# Patient Record
Sex: Male | Born: 1960 | Race: White | Hispanic: No | Marital: Married | Smoking: Never smoker
Health system: Southern US, Community
[De-identification: ages and names within clinical notes are randomized; demographics above are authoritative.]

## PROBLEM LIST (undated history)

## (undated) DIAGNOSIS — Z8489 Family history of other specified conditions: Secondary | ICD-10-CM

## (undated) DIAGNOSIS — I4891 Unspecified atrial fibrillation: Secondary | ICD-10-CM

## (undated) DIAGNOSIS — I509 Heart failure, unspecified: Secondary | ICD-10-CM

## (undated) DIAGNOSIS — Z992 Dependence on renal dialysis: Secondary | ICD-10-CM

## (undated) DIAGNOSIS — R5381 Other malaise: Secondary | ICD-10-CM

## (undated) DIAGNOSIS — I428 Other cardiomyopathies: Secondary | ICD-10-CM

## (undated) DIAGNOSIS — G40909 Epilepsy, unspecified, not intractable, without status epilepticus: Secondary | ICD-10-CM

## (undated) DIAGNOSIS — E43 Unspecified severe protein-calorie malnutrition: Secondary | ICD-10-CM

## (undated) DIAGNOSIS — Z940 Kidney transplant status: Secondary | ICD-10-CM

## (undated) DIAGNOSIS — Q600 Renal agenesis, unilateral: Secondary | ICD-10-CM

## (undated) DIAGNOSIS — E213 Hyperparathyroidism, unspecified: Secondary | ICD-10-CM

## (undated) DIAGNOSIS — I151 Hypertension secondary to other renal disorders: Secondary | ICD-10-CM

## (undated) DIAGNOSIS — E079 Disorder of thyroid, unspecified: Secondary | ICD-10-CM

## (undated) DIAGNOSIS — R569 Unspecified convulsions: Secondary | ICD-10-CM

## (undated) DIAGNOSIS — I823 Embolism and thrombosis of renal vein: Secondary | ICD-10-CM

## (undated) DIAGNOSIS — E7800 Pure hypercholesterolemia, unspecified: Secondary | ICD-10-CM

## (undated) DIAGNOSIS — N189 Chronic kidney disease, unspecified: Secondary | ICD-10-CM

## (undated) DIAGNOSIS — N186 End stage renal disease: Secondary | ICD-10-CM

## (undated) HISTORY — DX: Unspecified convulsions: R56.9

## (undated) HISTORY — DX: Other cardiomyopathies: I42.8

## (undated) HISTORY — DX: Heart failure, unspecified: I50.9

## (undated) HISTORY — DX: Renal agenesis, unilateral: Q60.0

## (undated) HISTORY — DX: Disorder of thyroid, unspecified: E07.9

## (undated) HISTORY — DX: Embolism and thrombosis of renal vein: I82.3

## (undated) HISTORY — PX: HERNIA REPAIR: SHX51

## (undated) HISTORY — DX: Hypertension secondary to other renal disorders: I15.1

## (undated) HISTORY — DX: Epilepsy, unspecified, not intractable, without status epilepticus: G40.909

## (undated) HISTORY — DX: Pure hypercholesterolemia, unspecified: E78.00

## (undated) HISTORY — DX: End stage renal disease: N18.6

## (undated) HISTORY — DX: Hyperparathyroidism, unspecified: E21.3

## (undated) HISTORY — DX: Unspecified severe protein-calorie malnutrition: E43

## (undated) HISTORY — DX: Chronic kidney disease, unspecified: N18.9

## (undated) HISTORY — DX: End stage renal disease: Z99.2

## (undated) HISTORY — PX: TONSILLECTOMY: SUR1361

## (undated) HISTORY — DX: Other malaise: R53.81

## (undated) HISTORY — PX: KIDNEY SURGERY: SHX687

## (undated) HISTORY — DX: Hypertension secondary to other renal disorders: Z94.0

---

## 2006-09-18 ENCOUNTER — Ambulatory Visit: Payer: Self-pay | Admitting: Internal Medicine

## 2006-09-18 ENCOUNTER — Encounter (INDEPENDENT_AMBULATORY_CARE_PROVIDER_SITE_OTHER): Payer: Self-pay | Admitting: *Deleted

## 2006-09-18 DIAGNOSIS — M109 Gout, unspecified: Secondary | ICD-10-CM

## 2006-09-18 DIAGNOSIS — I428 Other cardiomyopathies: Secondary | ICD-10-CM | POA: Insufficient documentation

## 2006-09-18 DIAGNOSIS — R569 Unspecified convulsions: Secondary | ICD-10-CM

## 2006-09-18 DIAGNOSIS — N189 Chronic kidney disease, unspecified: Secondary | ICD-10-CM

## 2006-09-18 LAB — CONVERTED CEMR LAB: INR: 1

## 2006-09-19 LAB — CONVERTED CEMR LAB
Albumin: 4 g/dL (ref 3.5–5.2)
Basophils Absolute: 0 10*3/uL (ref 0.0–0.1)
Basophils Relative: 1 % (ref 0–1)
Calcium: 9.5 mg/dL (ref 8.4–10.5)
Eosinophils Absolute: 0.1 10*3/uL (ref 0.0–0.7)
Eosinophils Relative: 2 % (ref 0–5)
HCT: 42.1 % (ref 39.0–52.0)
MCHC: 33.3 g/dL (ref 30.0–36.0)
MCV: 96.8 fL (ref 78.0–100.0)
Platelets: 99 10*3/uL — ABNORMAL LOW (ref 150–400)
RDW: 14.6 % — ABNORMAL HIGH (ref 11.5–14.0)
Sodium: 142 meq/L (ref 135–145)
Valproic Acid Lvl: 99.2 ug/mL (ref 50.0–100.0)

## 2006-10-02 ENCOUNTER — Emergency Department (HOSPITAL_COMMUNITY): Admission: EM | Admit: 2006-10-02 | Discharge: 2006-10-02 | Payer: Self-pay | Admitting: Emergency Medicine

## 2006-10-03 ENCOUNTER — Encounter (INDEPENDENT_AMBULATORY_CARE_PROVIDER_SITE_OTHER): Payer: Self-pay | Admitting: *Deleted

## 2006-10-18 ENCOUNTER — Encounter (INDEPENDENT_AMBULATORY_CARE_PROVIDER_SITE_OTHER): Payer: Self-pay | Admitting: Internal Medicine

## 2006-10-18 ENCOUNTER — Ambulatory Visit: Payer: Self-pay | Admitting: Internal Medicine

## 2006-10-21 LAB — CONVERTED CEMR LAB: Valproic Acid Lvl: 102.5 ug/mL — ABNORMAL HIGH (ref 50.0–100.0)

## 2007-03-01 ENCOUNTER — Emergency Department (HOSPITAL_COMMUNITY): Admission: EM | Admit: 2007-03-01 | Discharge: 2007-03-01 | Payer: Self-pay | Admitting: Emergency Medicine

## 2007-05-26 ENCOUNTER — Encounter (INDEPENDENT_AMBULATORY_CARE_PROVIDER_SITE_OTHER): Payer: Self-pay | Admitting: Internal Medicine

## 2007-08-14 ENCOUNTER — Encounter (INDEPENDENT_AMBULATORY_CARE_PROVIDER_SITE_OTHER): Payer: Self-pay | Admitting: Internal Medicine

## 2007-10-10 ENCOUNTER — Encounter (INDEPENDENT_AMBULATORY_CARE_PROVIDER_SITE_OTHER): Payer: Self-pay | Admitting: Internal Medicine

## 2007-12-10 ENCOUNTER — Encounter (INDEPENDENT_AMBULATORY_CARE_PROVIDER_SITE_OTHER): Payer: Self-pay | Admitting: Internal Medicine

## 2008-03-11 ENCOUNTER — Encounter (INDEPENDENT_AMBULATORY_CARE_PROVIDER_SITE_OTHER): Payer: Self-pay | Admitting: Internal Medicine

## 2008-04-21 ENCOUNTER — Telehealth (INDEPENDENT_AMBULATORY_CARE_PROVIDER_SITE_OTHER): Payer: Self-pay | Admitting: Internal Medicine

## 2008-04-29 ENCOUNTER — Encounter: Payer: Self-pay | Admitting: Internal Medicine

## 2008-04-29 ENCOUNTER — Ambulatory Visit: Payer: Self-pay | Admitting: *Deleted

## 2008-04-30 LAB — CONVERTED CEMR LAB
ALT: 12 units/L (ref 0–53)
AST: 19 units/L (ref 0–37)
Basophils Absolute: 0 10*3/uL (ref 0.0–0.1)
Basophils Relative: 0 % (ref 0–1)
CO2: 23 meq/L (ref 19–32)
Chloride: 105 meq/L (ref 96–112)
Eosinophils Relative: 2 % (ref 0–5)
Lymphocytes Relative: 39 % (ref 12–46)
MCHC: 32.1 g/dL (ref 30.0–36.0)
Neutro Abs: 3 10*3/uL (ref 1.7–7.7)
Platelets: 96 10*3/uL — ABNORMAL LOW (ref 150–400)
RDW: 14.2 % (ref 11.5–15.5)
Sodium: 140 meq/L (ref 135–145)
Total Bilirubin: 0.7 mg/dL (ref 0.3–1.2)
Total Protein: 6 g/dL (ref 6.0–8.3)

## 2008-06-23 ENCOUNTER — Encounter: Payer: Self-pay | Admitting: Internal Medicine

## 2008-08-17 ENCOUNTER — Ambulatory Visit: Payer: Self-pay | Admitting: Infectious Disease

## 2008-08-17 ENCOUNTER — Encounter: Payer: Self-pay | Admitting: Internal Medicine

## 2008-08-17 DIAGNOSIS — D696 Thrombocytopenia, unspecified: Secondary | ICD-10-CM

## 2008-08-17 LAB — CONVERTED CEMR LAB
Cholesterol: 171 mg/dL (ref 0–200)
HCT: 47.1 % (ref 39.0–52.0)
MCHC: 34 g/dL (ref 30.0–36.0)
MCV: 101.7 fL — ABNORMAL HIGH (ref 78.0–100.0)
Platelets: 65 10*3/uL — ABNORMAL LOW (ref 150–400)
RDW: 15.5 % (ref 11.5–15.5)
Total CHOL/HDL Ratio: 4.2
Valproic Acid Lvl: 121.4 ug/mL — ABNORMAL HIGH (ref 50.0–100.0)
WBC: 6.5 10*3/uL (ref 4.0–10.5)

## 2008-08-25 ENCOUNTER — Encounter: Payer: Self-pay | Admitting: Internal Medicine

## 2008-08-25 ENCOUNTER — Ambulatory Visit (HOSPITAL_COMMUNITY): Admission: RE | Admit: 2008-08-25 | Discharge: 2008-08-25 | Payer: Self-pay | Admitting: Internal Medicine

## 2008-08-31 ENCOUNTER — Ambulatory Visit: Payer: Self-pay | Admitting: Oncology

## 2008-09-09 LAB — CBC WITH DIFFERENTIAL (CANCER CENTER ONLY)
Eosinophils Absolute: 0.2 10*3/uL (ref 0.0–0.5)
LYMPH%: 38.6 % (ref 14.0–48.0)
MCV: 100 fL — ABNORMAL HIGH (ref 82–98)
MONO#: 0.5 10*3/uL (ref 0.1–0.9)
NEUT#: 2.5 10*3/uL (ref 1.5–6.5)
Platelets: 103 10*3/uL — ABNORMAL LOW (ref 145–400)
RBC: 4.42 10*6/uL (ref 4.20–5.70)
WBC: 5.3 10*3/uL (ref 4.0–10.0)

## 2008-09-09 LAB — MORPHOLOGY - CHCC SATELLITE

## 2008-09-09 LAB — CMP (CANCER CENTER ONLY)
Albumin: 3.1 g/dL — ABNORMAL LOW (ref 3.3–5.5)
BUN, Bld: 51 mg/dL — ABNORMAL HIGH (ref 7–22)
CO2: 26 mEq/L (ref 18–33)
Calcium: 10.8 mg/dL — ABNORMAL HIGH (ref 8.0–10.3)
Chloride: 111 mEq/L — ABNORMAL HIGH (ref 98–108)
Glucose, Bld: 106 mg/dL (ref 73–118)
Potassium: 5 mEq/L — ABNORMAL HIGH (ref 3.3–4.7)

## 2008-09-09 LAB — LACTATE DEHYDROGENASE: LDH: 183 U/L (ref 94–250)

## 2008-09-23 ENCOUNTER — Encounter: Payer: Self-pay | Admitting: Internal Medicine

## 2008-12-01 ENCOUNTER — Ambulatory Visit: Payer: Self-pay | Admitting: Oncology

## 2008-12-07 LAB — CBC WITH DIFFERENTIAL (CANCER CENTER ONLY)
Eosinophils Absolute: 0.2 10*3/uL (ref 0.0–0.5)
LYMPH#: 2.2 10*3/uL (ref 0.9–3.3)
MCH: 34.7 pg — ABNORMAL HIGH (ref 28.0–33.4)
MONO#: 0.4 10*3/uL (ref 0.1–0.9)
MONO%: 5.7 % (ref 0.0–13.0)
NEUT#: 3.9 10*3/uL (ref 1.5–6.5)
Platelets: 131 10*3/uL — ABNORMAL LOW (ref 145–400)
RBC: 4.52 10*6/uL (ref 4.20–5.70)
WBC: 6.7 10*3/uL (ref 4.0–10.0)

## 2009-01-07 ENCOUNTER — Telehealth: Payer: Self-pay | Admitting: Internal Medicine

## 2009-01-27 ENCOUNTER — Encounter: Payer: Self-pay | Admitting: Internal Medicine

## 2009-02-21 ENCOUNTER — Telehealth: Payer: Self-pay | Admitting: Internal Medicine

## 2009-03-28 ENCOUNTER — Encounter: Payer: Self-pay | Admitting: Internal Medicine

## 2009-05-26 ENCOUNTER — Encounter: Payer: Self-pay | Admitting: Internal Medicine

## 2009-07-27 ENCOUNTER — Telehealth: Payer: Self-pay | Admitting: Internal Medicine

## 2009-08-08 ENCOUNTER — Encounter: Payer: Self-pay | Admitting: Internal Medicine

## 2009-09-29 ENCOUNTER — Encounter: Payer: Self-pay | Admitting: Internal Medicine

## 2009-10-27 ENCOUNTER — Telehealth: Payer: Self-pay | Admitting: Internal Medicine

## 2009-10-31 ENCOUNTER — Encounter: Payer: Self-pay | Admitting: Internal Medicine

## 2009-11-10 ENCOUNTER — Telehealth: Payer: Self-pay | Admitting: Internal Medicine

## 2009-12-12 ENCOUNTER — Encounter: Payer: Self-pay | Admitting: Internal Medicine

## 2010-02-08 ENCOUNTER — Emergency Department (HOSPITAL_COMMUNITY)
Admission: EM | Admit: 2010-02-08 | Discharge: 2010-02-08 | Payer: Self-pay | Source: Home / Self Care | Admitting: Emergency Medicine

## 2010-02-15 ENCOUNTER — Encounter: Payer: Self-pay | Admitting: Internal Medicine

## 2010-04-25 LAB — CBC AND DIFFERENTIAL

## 2010-04-26 NOTE — Letter (Signed)
Summary: [facility]  [facility]   Imported By: Garlan Fillers 08/24/2009 14:33:22  _____________________________________________________________________  External Attachment:    Type:   Image     Comment:   External Document

## 2010-04-26 NOTE — Consult Note (Signed)
Summary: [facility]  [facility]   Imported By: Lacy Duverney 12/28/2009 15:46:51  _____________________________________________________________________  External Attachment:    Type:   Image     Comment:   External Document

## 2010-04-26 NOTE — Consult Note (Signed)
Summary: [facility]  [facility]   Imported By: Lacy Duverney 10/26/2009 16:25:18  _____________________________________________________________________  External Attachment:    Type:   Image     Comment:   External Document

## 2010-04-26 NOTE — Consult Note (Signed)
Summary: Peter Sloan   Imported By: Lacy Duverney 11/07/2009 12:25:52  _____________________________________________________________________  External Attachment:    Type:   Image     Comment:   External Document

## 2010-04-26 NOTE — Progress Notes (Signed)
Summary: med refill/gp  Phone Note Refill Request Message from:  Fax from Pharmacy on Jul 27, 2009 1:59 PM  Refills Requested: Medication #1:  PRAVACHOL 40 MG TABS one table by mouth daily.   Last Refilled: 07/06/2009  Method Requested: Electronic Initial call taken by: Morrison Old RN,  Jul 27, 2009 1:59 PM    Prescriptions: PRAVACHOL 40 MG TABS (PRAVASTATIN SODIUM) one table by mouth daily  #45 x 4   Entered and Authorized by:   Rudie Meyer MD   Signed by:   Rudie Meyer MD on 07/27/2009   Method used:   [facility] [address] [phone]* (retail)       [address]       [place], [place]  [address]       Ph: [phone] or [phone]       Fax: [phone]   RxID:   [id number]

## 2010-04-26 NOTE — Consult Note (Signed)
Summary: [facility]   Imported By: Bonner Puna 06/07/2009 10:20:26  _____________________________________________________________________  External Attachment:    Type:   Image     Comment:   External Document

## 2010-04-26 NOTE — Progress Notes (Signed)
Summary: med refill/gp  Phone Note Refill Request Message from:  Fax from Pharmacy on November 10, 2009 10:51 AM  Refills Requested: Medication #1:  CALCITRIOL 0.25 MCG  CAPS Take 1 capsule by mouth once a day Next appt. Aug. 25.   Method Requested: Electronic Initial call taken by: Morrison Old RN,  November 10, 2009 10:51 AM    Prescriptions: CALCITRIOL 0.25 MCG  CAPS (CALCITRIOL) Take 1 capsule by mouth once a day  #45 x 3   Entered and Authorized by:   Rudie Meyer MD   Signed by:   Rudie Meyer MD on 11/10/2009   Method used:   Electronically to        [facility]  [phone]* (retail)       [address]       [place], [place]  [address]       Ph: [phone] or [phone]       Fax: [phone]   RxID:   [id number]

## 2010-04-26 NOTE — Consult Note (Signed)
Summary: [facility]   Imported By: Bonner Puna 04/27/2009 16:41:11  _____________________________________________________________________  External Attachment:    Type:   Image     Comment:   External Document

## 2010-04-26 NOTE — Progress Notes (Signed)
Summary: med rfill/gp  Phone Note Refill Request Message from:  Fax from Pharmacy on October 27, 2009 10:29 AM  Refills Requested: Medication #1:  COREG 25 MG  TABS Take 1 tablet by mouth two times a day   Last Refilled: 08/25/2009 Last appt. May 25,2010.  I talked to pt.'s wife; appt. scheduled for Aug. 25 with you.   Method Requested: Electronic Initial call taken by: Morrison Old RN,  October 27, 2009 10:29 AM    Prescriptions: COREG 25 MG  TABS (CARVEDILOL) Take 1 tablet by mouth two times a day  #90 x 3   Entered and Authorized by:   Rudie Meyer MD   Signed by:   Rudie Meyer MD on 10/27/2009   Method used:   [facility]        [facility] [address] [phone]* (retail)       [address]       [place], [place]  [address]       Ph: [phone] or [phone]       Fax: [phone]   RxID:   [id number]

## 2010-05-11 ENCOUNTER — Encounter: Payer: Self-pay | Admitting: *Deleted

## 2010-05-15 ENCOUNTER — Emergency Department (HOSPITAL_COMMUNITY)
Admission: EM | Admit: 2010-05-15 | Discharge: 2010-05-15 | Disposition: A | Discharge status: Home / Self Care | Payer: Medicare Other | Attending: Emergency Medicine | Admitting: Emergency Medicine

## 2010-05-15 DIAGNOSIS — Z79899 Other long term (current) drug therapy: Secondary | ICD-10-CM | POA: Insufficient documentation

## 2010-05-15 DIAGNOSIS — G40909 Epilepsy, unspecified, not intractable, without status epilepticus: Secondary | ICD-10-CM | POA: Insufficient documentation

## 2010-05-15 DIAGNOSIS — I10 Essential (primary) hypertension: Secondary | ICD-10-CM | POA: Insufficient documentation

## 2010-05-15 DIAGNOSIS — I4891 Unspecified atrial fibrillation: Secondary | ICD-10-CM | POA: Insufficient documentation

## 2010-05-15 LAB — VALPROIC ACID LEVEL: Valproic Acid Lvl: 90.3 ug/mL (ref 50.0–100.0)

## 2010-05-15 LAB — DIFFERENTIAL
Lymphocytes Relative: 24 % (ref 12–46)
Lymphs Abs: 1.7 10*3/uL (ref 0.7–4.0)
Monocytes Absolute: 0.8 10*3/uL (ref 0.1–1.0)
Monocytes Relative: 11 % (ref 3–12)
Neutro Abs: 4.5 10*3/uL (ref 1.7–7.7)

## 2010-05-15 LAB — BASIC METABOLIC PANEL
CO2: 27 mEq/L (ref 19–32)
Calcium: 9.5 mg/dL (ref 8.4–10.5)
Creatinine, Ser: 5.92 mg/dL — ABNORMAL HIGH (ref 0.4–1.5)
Glucose, Bld: 124 mg/dL — ABNORMAL HIGH (ref 70–99)

## 2010-05-15 LAB — CBC
HCT: 45.6 % (ref 39.0–52.0)
Hemoglobin: 15.6 g/dL (ref 13.0–17.0)
MCH: 33.2 pg (ref 26.0–34.0)
MCHC: 34.2 g/dL (ref 30.0–36.0)

## 2010-06-06 LAB — POCT I-STAT, CHEM 8
BUN: 67 mg/dL — ABNORMAL HIGH (ref 6–23)
Chloride: 109 mEq/L (ref 96–112)
Glucose, Bld: 99 mg/dL (ref 70–99)
HCT: 52 % (ref 39.0–52.0)
Potassium: 3 mEq/L — ABNORMAL LOW (ref 3.5–5.1)

## 2010-06-06 LAB — VALPROIC ACID LEVEL: Valproic Acid Lvl: 83.1 ug/mL (ref 50.0–100.0)

## 2010-06-08 ENCOUNTER — Encounter (INDEPENDENT_AMBULATORY_CARE_PROVIDER_SITE_OTHER): Payer: Medicare Other

## 2010-06-08 DIAGNOSIS — M79609 Pain in unspecified limb: Secondary | ICD-10-CM

## 2010-07-14 ENCOUNTER — Encounter: Payer: Self-pay | Admitting: Internal Medicine

## 2010-07-16 ENCOUNTER — Emergency Department (HOSPITAL_COMMUNITY)
Admission: EM | Admit: 2010-07-16 | Discharge: 2010-07-16 | Disposition: A | Discharge status: Home / Self Care | Payer: Medicare Other | Attending: Emergency Medicine | Admitting: Emergency Medicine

## 2010-07-16 DIAGNOSIS — E7800 Pure hypercholesterolemia, unspecified: Secondary | ICD-10-CM | POA: Insufficient documentation

## 2010-07-16 DIAGNOSIS — I129 Hypertensive chronic kidney disease with stage 1 through stage 4 chronic kidney disease, or unspecified chronic kidney disease: Secondary | ICD-10-CM | POA: Insufficient documentation

## 2010-07-16 DIAGNOSIS — G40909 Epilepsy, unspecified, not intractable, without status epilepticus: Secondary | ICD-10-CM | POA: Insufficient documentation

## 2010-07-16 DIAGNOSIS — I4891 Unspecified atrial fibrillation: Secondary | ICD-10-CM | POA: Insufficient documentation

## 2010-07-16 DIAGNOSIS — E860 Dehydration: Secondary | ICD-10-CM | POA: Insufficient documentation

## 2010-07-16 DIAGNOSIS — N189 Chronic kidney disease, unspecified: Secondary | ICD-10-CM | POA: Insufficient documentation

## 2010-07-16 LAB — POCT I-STAT, CHEM 8
BUN: 66 mg/dL — ABNORMAL HIGH (ref 6–23)
Chloride: 106 mEq/L (ref 96–112)
HCT: 55 % — ABNORMAL HIGH (ref 39.0–52.0)
Potassium: 3.4 mEq/L — ABNORMAL LOW (ref 3.5–5.1)

## 2010-07-16 LAB — GLUCOSE, CAPILLARY: Glucose-Capillary: 104 mg/dL — ABNORMAL HIGH (ref 70–99)

## 2010-07-16 LAB — VALPROIC ACID LEVEL: Valproic Acid Lvl: 96.9 ug/mL (ref 50.0–100.0)

## 2010-07-31 ENCOUNTER — Encounter: Payer: Self-pay | Admitting: Internal Medicine

## 2011-01-01 LAB — I-STAT 8, (EC8 V) (CONVERTED LAB)
Acid-base deficit: 7 — ABNORMAL HIGH
Bicarbonate: 19.5 — ABNORMAL LOW
HCT: 43
Hemoglobin: 14.6
Operator id: 284251
Sodium: 139
TCO2: 21

## 2011-01-01 LAB — POCT I-STAT CREATININE: Operator id: 284251

## 2011-01-01 LAB — VALPROIC ACID LEVEL: Valproic Acid Lvl: 93.9

## 2011-01-09 LAB — CBC
MCHC: 34.1
RDW: 15.2 — ABNORMAL HIGH

## 2011-01-09 LAB — I-STAT 8, (EC8 V) (CONVERTED LAB)
Acid-base deficit: 5 — ABNORMAL HIGH
Chloride: 107
Glucose, Bld: 107 — ABNORMAL HIGH
TCO2: 20
pCO2, Ven: 30.8 — ABNORMAL LOW
pH, Ven: 7.392 — ABNORMAL HIGH

## 2011-01-09 LAB — DIFFERENTIAL
Basophils Absolute: 0
Basophils Relative: 1
Neutro Abs: 4.5
Neutrophils Relative %: 59

## 2011-01-09 LAB — POCT I-STAT CREATININE
Creatinine, Ser: 5.1 — ABNORMAL HIGH
Operator id: 270651

## 2012-01-12 ENCOUNTER — Encounter (HOSPITAL_COMMUNITY): Payer: Self-pay | Admitting: Physical Medicine and Rehabilitation

## 2012-01-12 ENCOUNTER — Observation Stay (HOSPITAL_COMMUNITY)
Admission: EM | Admit: 2012-01-12 | Discharge: 2012-01-13 | Disposition: A | Discharge status: Home / Self Care | Payer: Medicare Other | Attending: Internal Medicine | Admitting: Internal Medicine

## 2012-01-12 ENCOUNTER — Emergency Department (HOSPITAL_COMMUNITY): Payer: Medicare Other

## 2012-01-12 DIAGNOSIS — Q602 Renal agenesis, unspecified: Secondary | ICD-10-CM | POA: Insufficient documentation

## 2012-01-12 DIAGNOSIS — I4891 Unspecified atrial fibrillation: Secondary | ICD-10-CM | POA: Insufficient documentation

## 2012-01-12 DIAGNOSIS — Z7902 Long term (current) use of antithrombotics/antiplatelets: Secondary | ICD-10-CM | POA: Insufficient documentation

## 2012-01-12 DIAGNOSIS — G40901 Epilepsy, unspecified, not intractable, with status epilepticus: Secondary | ICD-10-CM

## 2012-01-12 DIAGNOSIS — Z79899 Other long term (current) drug therapy: Secondary | ICD-10-CM | POA: Insufficient documentation

## 2012-01-12 DIAGNOSIS — Z992 Dependence on renal dialysis: Secondary | ICD-10-CM | POA: Insufficient documentation

## 2012-01-12 DIAGNOSIS — M109 Gout, unspecified: Secondary | ICD-10-CM

## 2012-01-12 DIAGNOSIS — N189 Chronic kidney disease, unspecified: Secondary | ICD-10-CM

## 2012-01-12 DIAGNOSIS — E876 Hypokalemia: Secondary | ICD-10-CM

## 2012-01-12 DIAGNOSIS — I428 Other cardiomyopathies: Secondary | ICD-10-CM | POA: Insufficient documentation

## 2012-01-12 DIAGNOSIS — E785 Hyperlipidemia, unspecified: Secondary | ICD-10-CM | POA: Insufficient documentation

## 2012-01-12 DIAGNOSIS — D696 Thrombocytopenia, unspecified: Secondary | ICD-10-CM

## 2012-01-12 DIAGNOSIS — N186 End stage renal disease: Secondary | ICD-10-CM | POA: Insufficient documentation

## 2012-01-12 DIAGNOSIS — R569 Unspecified convulsions: Secondary | ICD-10-CM

## 2012-01-12 DIAGNOSIS — Q605 Renal hypoplasia, unspecified: Secondary | ICD-10-CM | POA: Insufficient documentation

## 2012-01-12 DIAGNOSIS — G40401 Other generalized epilepsy and epileptic syndromes, not intractable, with status epilepticus: Principal | ICD-10-CM | POA: Insufficient documentation

## 2012-01-12 LAB — COMPREHENSIVE METABOLIC PANEL
ALT: 11 U/L (ref 0–53)
AST: 22 U/L (ref 0–37)
Albumin: 3.5 g/dL (ref 3.5–5.2)
Calcium: 9.3 mg/dL (ref 8.4–10.5)
GFR calc Af Amer: 25 mL/min — ABNORMAL LOW (ref >90)
Glucose, Bld: 111 mg/dL — ABNORMAL HIGH (ref 70–99)
Sodium: 139 mEq/L (ref 135–145)
Total Protein: 6.6 g/dL (ref 6.0–8.3)

## 2012-01-12 LAB — CBC WITH DIFFERENTIAL/PLATELET
Basophils Absolute: 0 10*3/uL (ref 0.0–0.1)
Basophils Relative: 0 % (ref 0–1)
Eosinophils Absolute: 0.1 10*3/uL (ref 0.0–0.7)
Eosinophils Relative: 1 % (ref 0–5)
MCH: 33.2 pg (ref 26.0–34.0)
MCHC: 33.6 g/dL (ref 30.0–36.0)
MCV: 99 fL (ref 78.0–100.0)
Platelets: 217 10*3/uL (ref 150–400)
RDW: 13.5 % (ref 11.5–15.5)

## 2012-01-12 LAB — MRSA PCR SCREENING: MRSA by PCR: NEGATIVE

## 2012-01-12 MED ORDER — SIMVASTATIN 20 MG PO TABS
20.0000 mg | ORAL_TABLET | Freq: Every day | ORAL | Status: DC
  Filled 2012-01-12: qty 1

## 2012-01-12 MED ORDER — LORAZEPAM 2 MG/ML IJ SOLN: 2.0000 mg | Freq: Once | INTRAMUSCULAR | Status: DC

## 2012-01-12 MED ORDER — DILTIAZEM HCL 90 MG PO TABS
90.0000 mg | ORAL_TABLET | Freq: Two times a day (BID) | ORAL | Status: DC
  Administered 2012-01-13 (×2): 90 mg via ORAL
  Filled 2012-01-12 (×3): qty 1

## 2012-01-12 MED ORDER — VALPROATE SODIUM 500 MG/5ML IV SOLN
500.0000 mg | Freq: Once | INTRAVENOUS | Status: DC
  Filled 2012-01-12: qty 5

## 2012-01-12 MED ORDER — LAMOTRIGINE 25 MG PO TABS
50.0000 mg | ORAL_TABLET | Freq: Two times a day (BID) | ORAL | Status: DC
  Administered 2012-01-13 (×2): 50 mg via ORAL
  Filled 2012-01-12 (×3): qty 2

## 2012-01-12 MED ORDER — SODIUM CHLORIDE 0.9 % IV BOLUS (SEPSIS)
1000.0000 mL | Freq: Once | INTRAVENOUS | Status: AC
  Administered 2012-01-12: 1000 mL via INTRAVENOUS

## 2012-01-12 MED ORDER — SODIUM CHLORIDE 0.9 % IJ SOLN
3.0000 mL | Freq: Two times a day (BID) | INTRAMUSCULAR | Status: DC
  Administered 2012-01-13: 3 mL via INTRAVENOUS

## 2012-01-12 MED ORDER — LAMOTRIGINE 50 MG PO TBDP: 1.0000 | ORAL_TABLET | Freq: Every day | ORAL | Status: DC

## 2012-01-12 MED ORDER — DIVALPROEX SODIUM ER 500 MG PO TB24
750.0000 mg | ORAL_TABLET | Freq: Once | ORAL | Status: AC
  Filled 2012-01-12: qty 1

## 2012-01-12 MED ORDER — SODIUM CHLORIDE 0.9 % IJ SOLN: 3.0000 mL | INTRAMUSCULAR | Status: DC | PRN

## 2012-01-12 MED ORDER — SODIUM CHLORIDE 0.9 % IV SOLN: INTRAVENOUS | Status: DC

## 2012-01-12 MED ORDER — RENA-VITE PO TABS
1.0000 | ORAL_TABLET | Freq: Every day | ORAL | Status: DC
  Administered 2012-01-13: 1 via ORAL
  Filled 2012-01-12 (×2): qty 1

## 2012-01-12 MED ORDER — ONDANSETRON HCL 4 MG/2ML IJ SOLN: 4.0000 mg | Freq: Four times a day (QID) | INTRAMUSCULAR | Status: DC | PRN

## 2012-01-12 MED ORDER — VALPROATE SODIUM 500 MG/5ML IV SOLN
500.0000 mg | Freq: Once | INTRAVENOUS | Status: AC
  Administered 2012-01-12: 500 mg via INTRAVENOUS
  Filled 2012-01-12 (×2): qty 5

## 2012-01-12 MED ORDER — AMIODARONE HCL 200 MG PO TABS
200.0000 mg | ORAL_TABLET | Freq: Every day | ORAL | Status: DC
  Administered 2012-01-13: 200 mg via ORAL
  Filled 2012-01-12: qty 1

## 2012-01-12 MED ORDER — ALLOPURINOL 300 MG PO TABS
300.0000 mg | ORAL_TABLET | Freq: Every evening | ORAL | Status: DC
  Administered 2012-01-13: 300 mg via ORAL
  Filled 2012-01-12: qty 1

## 2012-01-12 MED ORDER — ASPIRIN EC 81 MG PO TBEC
81.0000 mg | DELAYED_RELEASE_TABLET | ORAL | Status: DC
  Administered 2012-01-13: 81 mg via ORAL
  Filled 2012-01-12 (×2): qty 1

## 2012-01-12 MED ORDER — LORAZEPAM 2 MG/ML IJ SOLN
2.0000 mg | Freq: Once | INTRAMUSCULAR | Status: AC
  Administered 2012-01-12: 2 mg via INTRAVENOUS

## 2012-01-12 MED ORDER — ONDANSETRON HCL 4 MG PO TABS: 4.0000 mg | ORAL_TABLET | Freq: Four times a day (QID) | ORAL | Status: DC | PRN

## 2012-01-12 MED ORDER — DIVALPROEX SODIUM ER 500 MG PO TB24
750.0000 mg | ORAL_TABLET | Freq: Two times a day (BID) | ORAL | Status: DC
  Administered 2012-01-13 (×3): 750 mg via ORAL
  Filled 2012-01-12 (×3): qty 1

## 2012-01-12 MED ORDER — DIVALPROEX SODIUM ER 500 MG PO TB24: 500.0000 mg | ORAL_TABLET | Freq: Two times a day (BID) | ORAL | Status: DC

## 2012-01-12 MED ORDER — HEPARIN SODIUM (PORCINE) 5000 UNIT/ML IJ SOLN
5000.0000 [IU] | Freq: Three times a day (TID) | INTRAMUSCULAR | Status: DC
  Administered 2012-01-13 (×2): 5000 [IU] via SUBCUTANEOUS
  Filled 2012-01-12 (×4): qty 1

## 2012-01-12 MED ORDER — SODIUM CHLORIDE 0.9 % IV SOLN: 250.0000 mL | INTRAVENOUS | Status: DC | PRN

## 2012-01-12 MED ORDER — POTASSIUM CHLORIDE CRYS ER 20 MEQ PO TBCR
10.0000 meq | EXTENDED_RELEASE_TABLET | Freq: Once | ORAL | Status: AC
  Administered 2012-01-12: 10 meq via ORAL
  Filled 2012-01-12: qty 1

## 2012-01-12 NOTE — ED Notes (Signed)
Patient is resting comfortably. 

## 2012-01-12 NOTE — ED Notes (Signed)
Patient brought back from CT and X-ray.

## 2012-01-12 NOTE — ED Notes (Signed)
MD at bedside. 

## 2012-01-12 NOTE — ED Notes (Signed)
Patient transported to CT 

## 2012-01-12 NOTE — ED Notes (Signed)
MD at bedside. Dr. Tobias Alexander.

## 2012-01-12 NOTE — ED Provider Notes (Signed)
History     CSN: [id number]  Arrival date & time 01/12/12  1703   First MD Initiated Contact with Patient 01/12/12 1706      Chief Complaint  Patient presents with  . Seizures    (Consider location/radiation/quality/duration/timing/severity/associated sxs/prior treatment) HPI - Level 5 caveat due to unresponsive Pt with history of seizures has been well controlled for over a year on Depakote and Lamictal, no recent changes in dose. He had his normal dialysis this morning without complications. Wife reports this afternoon, she found him seizing, tonic-clonic and EMS was called. On their arrival, the reports several back to back seizures without return to baseline between. He was given Versed 7.5mg  total and no further seizures enroute. CBG was 150s.    Past Medical History  Diagnosis Date  . Congenital solitary kidney   . Chronic renal insufficiency     2/2 ischemic ATN, GFR 15-17 ml/min, baseline Cr: 4-4.5, never had a renal biopsy, s/p AV fistula placement in 2007, f/b nephrologist, Dr. Dorothe Pea, [place]  . Nonischemic cardiomyopathy     EF 20%(9/06) with most recent EF 50%(3/08), valves normal per 2D echo (3/08), normal doppler and color flow study(3/08) s/p endocmyocardial  biopsy: mild myocyte hypertrophy no myocyte damage or inflammation  . Seizure disorder     complex- partial, h/o sz disorder as a child  . Gout     h/o: many in Rt big toe and Rt Knee, never had aspirartion diagnosis    History reviewed. No pertinent past surgical history.  Family History  Problem Relation Age of Onset  . Osteoarthritis Mother   . Hypertension Father   . Muscular dystrophy Brother   . Heart disease Child     one 36 y/o son has MVP    History  Substance Use Topics  . Smoking status: Never Smoker   . Smokeless tobacco: Not on file  . Alcohol Use: No     social drinker      Review of Systems Unable to assess due to mental status.   Allergies  Diphenhydramine  hcl  Home Medications   Current Outpatient Rx  Name Route Sig Dispense Refill  . ASPIRIN 81 MG PO TABS Oral Take 81 mg by mouth daily.      Marland Kitchen CALCITRIOL 0.25 MCG PO CAPS Oral Take 0.25 mcg by mouth daily.      Marland Kitchen CARVEDILOL 25 MG PO TABS Oral Take 25 mg by mouth 2 (two) times daily with a meal.      . DIVALPROEX SODIUM ER 500 MG PO TB24 Oral Take 500 mg by mouth. Take 1 pill by mouth at night and one in the morning.     . FUROSEMIDE 20 MG PO TABS Oral Take 20 mg by mouth daily.      Marland Kitchen PRAVASTATIN SODIUM 40 MG PO TABS Oral Take 40 mg by mouth daily.        BP 84/45  Pulse 91  Temp 98.6 F (37 C) (Rectal)  SpO2 100%  Physical Exam  Nursing note and vitals reviewed. Constitutional: He appears well-developed and well-nourished.  HENT:  Head: Normocephalic and atraumatic.  Eyes: Pupils are equal, round, and reactive to light.  Neck: Neck supple.  Cardiovascular: Normal rate, normal heart sounds and intact distal pulses.   Pulmonary/Chest: Effort normal and breath sounds normal.  Abdominal: Soft. He exhibits no distension.  Musculoskeletal: Normal range of motion. He exhibits no edema and no tenderness.       Dialysis graft  in L arm with thrill  Skin: Skin is warm and dry. No rash noted.  Psychiatric: He has a normal mood and affect.    ED Course  Procedures (including critical care time)  Labs Reviewed  VALPROIC ACID LEVEL - Abnormal; Notable for the following:    Valproic Acid Lvl 36.8 (*)     All other components within normal limits  CBC WITH DIFFERENTIAL - Abnormal; Notable for the following:    RBC 3.94 (*)     Monocytes Relative 13 (*)     All other components within normal limits  COMPREHENSIVE METABOLIC PANEL - Abnormal; Notable for the following:    Potassium 3.1 (*)     Glucose, Bld 111 (*)     Creatinine, Ser 3.16 (*)     Total Bilirubin 0.2 (*)     GFR calc non Af Amer 21 (*)     GFR calc Af Amer 25 (*)     All other components within normal limits   MAGNESIUM  ETHANOL  MRSA PCR SCREENING  URINALYSIS, ROUTINE W REFLEX MICROSCOPIC  URINE RAPID DRUG SCREEN (HOSP PERFORMED)  LAMOTRIGINE LEVEL  CBC  CREATININE, SERUM  TSH   Dg Chest 1 View  01/12/2012  *RADIOLOGY REPORT*  Clinical Data: Status epilepticus  CHEST - 1 VIEW  Comparison: None  Findings: Elevated left hemidiaphragm.  Left lower lobe airspace disease may represent atelectasis.  No prior studies available. Right lung is clear.  Negative for heart failure.  IMPRESSION: Elevated left hemidiaphragm and left lower lobe atelectasis.   Original Report Authenticated By: Truett Perna, M.D.    Ct Head Wo Contrast  01/12/2012  *RADIOLOGY REPORT*  Clinical Data: Seizures  CT HEAD WITHOUT CONTRAST  Technique:  Contiguous axial images were obtained from the base of the skull through the vertex without contrast.  Comparison: None.  Findings: Study is suboptimal due to motion artifact.  Endotracheal tube is partially visualized.  There is a probable mucous retention cyst left maxillary sinus measures 1.3 cm.  No skull fracture is noted.  No intracranial hemorrhage, mass effect or midline shift.  No definite acute cortical infarction. No mass lesion is noted on this unenhanced scan.  IMPRESSION: No acute intracranial abnormality.  Suboptimal study due to motion. Endotracheal tube is partially visualized.   Original Report Authenticated By: Lahoma Crocker, M.D.      No diagnosis found.    MDM   Date: 01/12/2012  Rate: 105  Rhythm: sinus tachycardia  QRS Axis: normal  Intervals: normal  ST/T Wave abnormalities: nonspecific T wave changes  Conduction Disutrbances:none  Narrative Interpretation:   Old EKG Reviewed: fewer PVCs today   PT with subtherapeutic depakote. Waking up some and oriented to place and situation. Discussed with Neurology who recommended loading with depakote. Admit for further eval. Discussed with hospitalist.   CRITICAL CARE Performed by: Truddie Hidden.   Total critical care time: 35  Critical care time was exclusive of separately billable procedures and treating other patients.  Critical care was necessary to treat or prevent imminent or life-threatening deterioration.  Critical care was time spent personally by me on the following activities: development of treatment plan with patient and/or surrogate as well as nursing, discussions with consultants, evaluation of patient's response to treatment, examination of patient, obtaining history from patient or surrogate, ordering and performing treatments and interventions, ordering and review of laboratory studies, ordering and review of radiographic studies, pulse oximetry and re-evaluation of patient's condition.  Charles B. Karle Starch, MD 01/13/12 [id number]

## 2012-01-12 NOTE — Consult Note (Signed)
Reason for Consult: Status epilepticus  CC: Seizure  History is obtained from: Wife, mother  HPI: Peter Sloan is an 51 y.o. male with a history of seizure disorder since he was a small child who sees Dr. Gaynell Face as an outpatient. Today he had his first episode of status epilepticus. His wife reports that she heard a noise from the study and went to investigate and found him shaking. This lasted for approximately 15 minutes, and he paused briefly, then began disease again. This lasted until he was brought to the emergency room and given Ativan.   In the ER, he began to wake up in neurology was consulted for further medication recommendations.  He has been seizure-free for about a year and a half he takes Depakote 500 mg twice a day as well as lamotrigine 50 mg twice a day.   When I examined him, he was able to speak some but was still postictal period  ROS: Unable to obtain due to 2 postictal state, the wife states that he has not been sick  Past Medical History  Diagnosis Date  . Congenital solitary kidney   . Chronic renal insufficiency     2/2 ischemic ATN, GFR 15-17 ml/min, baseline Cr: 4-4.5, never had a renal biopsy, s/p AV fistula placement in 2007, f/b nephrologist, Dr. Dorothe Pea, [place]  . Nonischemic cardiomyopathy     EF 20%(9/06) with most recent EF 50%(3/08), valves normal per 2D echo (3/08), normal doppler and color flow study(3/08) s/p endocmyocardial  biopsy: mild myocyte hypertrophy no myocyte damage or inflammation  . Seizure disorder     complex- partial, h/o sz disorder as a child  . Gout     h/o: many in Rt big toe and Rt Knee, never had aspirartion diagnosis    Family History: Mother reports no history of seizure disorders in the family  Social History: Tob: Wife deny  Exam: Current vital signs: BP 104/62  Pulse 77  Temp 98.6 F (37 C) (Rectal)  Resp 22  SpO2 99% Vital signs in last 24 hours: Temp:  [98.6 F (37 C)] 98.6 F (37 C) (10/19  1715) Pulse Rate:  [73-92] 77  (10/19 2045) Resp:  [14-22] 22  (10/19 2045) BP: (79-128)/(45-79) 104/62 mmHg (10/19 2045) SpO2:  [99 %-100 %] 99 % (10/19 2045)  General: In bed with nonrebreather in place CV: Regular rate and rhythm Mental Status: Patient is awake, alert, oriented to person, place, states "either [place] or [place]", month, year, and situation. He does have a prolonged latency to respond to some questions, but complies with most commands Cranial Nerves: II: Visual Fields are full. Pupils are equal, round, and reactive to light.  Discs are difficult to visualize. III,IV, VI: EOMI without ptosis or diploplia.  V,VII: Facial sensation and movement are symmetric.  VIII: hearing is intact to voice X: Uvula elevates symmetrically XI: Shoulder shrug is symmetric. XII: tongue is midline without atrophy or fasciculations.  Motor: Tone is normal. Bulk is normal. 5/5 strength was present in all four extremities.  Sensory: Sensation is symmetric to light touch and temperature in the arms and legs. Deep Tendon Reflexes: 2+ and symmetric in the biceps and patellae. Cerebellar: FNF appears tremulous bilaterally  Gait: Did not assess due to patient safety concerns  I have reviewed labs in epic and the results pertinent to this consultation are: Depakote level-36  I have reviewed the images obtained: CT head-no acute hemorrhage but limited by motion  Impression: 51 year old male who  presented in status epilepticus. He is continuing to improve, and I am encouraged by his improvement in his mental status. His Depakote level is low, and he has been loaded with an extra 500 mg IV  Recommendations: 1) increase Depakote to 750 mg twice a day 2) continue lamotrigine 50 mg twice a day 3) lamotrigine level is currently pending  Roland Rack, MD [facility] [phone]  If 7pm- 7am, please page neurology on call at [phone].

## 2012-01-12 NOTE — ED Notes (Signed)
Family at bedside. 

## 2012-01-12 NOTE — ED Notes (Signed)
Pt presents to department via [facility] from home for evaluation of seizure. Witnessed by wife, history of seizures. 5-7 reported per EMS, length variable. Thick bloody secretions noted from mouth, tongue trauma noted. NRB mask upon arrival. HD patient, treatments Tues, Thur, Sat. Last treatment this morning without complications. Fistula L forearm. 18g RAC. CBG 138.

## 2012-01-13 ENCOUNTER — Encounter (HOSPITAL_COMMUNITY): Payer: Self-pay | Admitting: *Deleted

## 2012-01-13 DIAGNOSIS — I428 Other cardiomyopathies: Secondary | ICD-10-CM

## 2012-01-13 DIAGNOSIS — E785 Hyperlipidemia, unspecified: Secondary | ICD-10-CM

## 2012-01-13 LAB — BASIC METABOLIC PANEL
GFR calc Af Amer: 15 mL/min — ABNORMAL LOW (ref >90)
GFR calc non Af Amer: 13 mL/min — ABNORMAL LOW (ref >90)
Glucose, Bld: 87 mg/dL (ref 70–99)
Potassium: 4 mEq/L (ref 3.5–5.1)
Sodium: 141 mEq/L (ref 135–145)

## 2012-01-13 LAB — RAPID URINE DRUG SCREEN, HOSP PERFORMED
Cocaine: NOT DETECTED
Opiates: NOT DETECTED
Tetrahydrocannabinol: NOT DETECTED

## 2012-01-13 LAB — URINALYSIS, ROUTINE W REFLEX MICROSCOPIC
Hgb urine dipstick: NEGATIVE
Nitrite: NEGATIVE
Specific Gravity, Urine: 1.019 (ref 1.005–1.030)
Urobilinogen, UA: 0.2 mg/dL (ref 0.0–1.0)

## 2012-01-13 LAB — URINE MICROSCOPIC-ADD ON

## 2012-01-13 MED ORDER — DIVALPROEX SODIUM ER 250 MG PO TB24: 250.0000 mg | ORAL_TABLET | Freq: Two times a day (BID) | ORAL | Status: DC

## 2012-01-13 MED ORDER — ATORVASTATIN CALCIUM 10 MG PO TABS
10.0000 mg | ORAL_TABLET | Freq: Every day | ORAL | Status: DC
  Administered 2012-01-13: 10 mg via ORAL
  Filled 2012-01-13: qty 1

## 2012-01-13 MED ORDER — DIVALPROEX SODIUM ER 250 MG PO TB24: 250.0000 mg | ORAL_TABLET | Freq: Every day | ORAL | Status: DC

## 2012-01-13 MED ORDER — BIOTENE DRY MOUTH MT LIQD
15.0000 mL | Freq: Two times a day (BID) | OROMUCOSAL | Status: DC
  Administered 2012-01-13: 15 mL via OROMUCOSAL

## 2012-01-13 NOTE — H&P (Signed)
[facility] History and Physical  Andr… Brading [id number] DOB: 1960/09/25    PCP:  None Neurologist:  Dr Wyline Copas.  Chief Complaint: Seizure.  HPI: Peter Sloan is an 51 y.o. male with hx of Sz on Lamictal and Depakote, last Sz was one and a half years ago, brought into the ER with 2 episodes of seizures, witness by his mother.  He had seizure for about 15 minutes, ceased, then had another one.  In the ER, I was able to converse meaningfully with him.  He recognized everyones in the room with him.  Work up in the ER included a negative head CT, but a subthx Depakote level of 36.  He has a K of 3.1 and a Cr of 3.1. He has hx of ESRD and has been on HD.  He denied n/v, HA, fever, or chills.  He doesnot drink alcohol, and denied drug use. He has been compliant with his medications.  Neurologist has been consulted by EDP, and hospitalist was asked to admit him for further observation.  Rewiew of Systems:  Constitutional: Negative for malaise, fever and chills. No significant weight loss or weight gain Eyes: Negative for eye pain, redness and discharge, diplopia, visual changes, or flashes of light. ENMT: Negative for ear pain, hoarseness, nasal congestion, sinus pressure and sore throat. No headaches; tinnitus, drooling, or problem swallowing. Cardiovascular: Negative for chest pain, palpitations, diaphoresis, dyspnea and peripheral edema. ; No orthopnea, PND Respiratory: Negative for cough, hemoptysis, wheezing and stridor. No pleuritic chestpain. Gastrointestinal: Negative for nausea, vomiting, diarrhea, constipation, abdominal pain, melena, blood in stool, hematemesis, jaundice and rectal bleeding.    Genitourinary: Negative for frequency, dysuria, incontinence,flank pain and hematuria; Musculoskeletal: Negative for back pain and neck pain. Negative for swelling and trauma.;  Skin: . Negative for pruritus, rash, abrasions, bruising and skin lesion.; ulcerations Neuro: Negative  for headache, lightheadedness and neck stiffness. Negative for weakness, altered level of consciousness , altered mental status, extremity weakness, burning feet, involuntary movement, seizure and syncope.  Psych: negative for anxiety, depression, insomnia, tearfulness, panic attacks, hallucinations, paranoia, suicidal or homicidal ideation .   Past Medical History  Diagnosis Date  . Congenital solitary kidney   . Chronic renal insufficiency     2/2 ischemic ATN, GFR 15-17 ml/min, baseline Cr: 4-4.5, never had a renal biopsy, s/p AV fistula placement in 2007, f/b nephrologist, Dr. Dorothe Pea, [place]  . Nonischemic cardiomyopathy     EF 20%(9/06) with most recent EF 50%(3/08), valves normal per 2D echo (3/08), normal doppler and color flow study(3/08) s/p endocmyocardial  biopsy: mild myocyte hypertrophy no myocyte damage or inflammation  . Seizure disorder     complex- partial, h/o sz disorder as a child  . Gout     h/o: many in Rt big toe and Rt Knee, never had aspirartion diagnosis    History reviewed. No pertinent past surgical history.  Medications:  HOME MEDS: Prior to Admission medications   Medication Sig Start Date End Date Taking? Authorizing Provider  allopurinol (ZYLOPRIM) 300 MG tablet Take 300 mg by mouth every evening.   Yes Historical Provider, MD  amiodarone (PACERONE) 200 MG tablet Take 200 mg by mouth daily.   Yes Historical Provider, MD  aspirin 81 MG tablet Take 81 mg by mouth every morning.    Yes Historical Provider, MD  b complex-vitamin c-folic acid (NEPHRO-VITE) 0.8 MG TABS Take 0.8 mg by mouth daily.   Yes Historical Provider, MD  diltiazem (CARDIZEM) 90 MG tablet Take  90 mg by mouth 2 (two) times daily.   Yes Historical Provider, MD  divalproex (DEPAKOTE ER) 500 MG 24 hr tablet Take 500 mg by mouth 2 (two) times daily.    Yes Historical Provider, MD  LamoTRIgine 50 MG TBDP Take 1 tablet by mouth at bedtime.   Yes Historical Provider, MD  pravastatin  (PRAVACHOL) 40 MG tablet Take 40 mg by mouth at bedtime.    Yes Historical Provider, MD     Allergies:  Allergies  Allergen Reactions  . Diphenhydramine Hcl     Social History:   reports that he has never smoked. He does not have any smokeless tobacco history on file. He reports that he does not drink alcohol or use illicit drugs.  Family History: Family History  Problem Relation Age of Onset  . Osteoarthritis Mother   . Hypertension Father   . Muscular dystrophy Brother   . Heart disease Child     one 62 y/o son has MVP     Physical Exam: Filed Vitals:   01/12/12 2045 01/12/12 2200 01/13/12 0000 01/13/12 0002  BP: 104/62 118/69 92/61 100/60  Pulse: 77 81 79   Temp:  98.9 F (37.2 C) 97.7 F (36.5 C)   TempSrc:  Oral Oral   Resp: 22 20 18    SpO2: 99% 100% 100%    Blood pressure 100/60, pulse 79, temperature 97.7 F (36.5 C), temperature source Oral, resp. rate 18, SpO2 100.00%.  GEN:  Pleasant  patient lying in the stretcher in no acute distress; cooperative with exam. PSYCH:  alert and oriented x4; does not appear anxious or depressed; affect is appropriate. HEENT: Mucous membranes pink and anicteric; PERRLA; EOM intact; no cervical lymphadenopathy nor thyromegaly or carotid bruit; no JVD; There were no stridor. Neck is very supple. Breasts:: Not examined CHEST WALL: No tenderness CHEST: Normal respiration, clear to auscultation bilaterally.  HEART: Regular rate and rhythm.  There are no murmur, rub, or gallops.   BACK: No kyphosis or scoliosis; no CVA tenderness ABDOMEN: soft and non-tender; no masses, no organomegaly, normal abdominal bowel sounds; no pannus; no intertriginous candida. There is no rebound and no distention. Rectal Exam: Not done EXTREMITIES: No bone or joint deformity; age-appropriate arthropathy of the hands and knees; no edema; no ulcerations.  There is no calf tenderness. He has an AVF on his left upper arm. Genitalia: not examined PULSES: 2+  and symmetric SKIN: Normal hydration no rash or ulceration CNS: Cranial nerves 2-12 grossly intact no focal lateralizing neurologic deficit.  Speech is fluent; uvula elevated with phonation, facial symmetry and tongue midline. DTR are normal bilaterally, cerebella exam is intact, barbinski is negative and strengths are equaled bilaterally.  No sensory loss.   Labs on Admission:  Basic Metabolic Panel:  Lab 99991111 1710  NA 139  K 3.1*  CL 99  CO2 27  GLUCOSE 111*  BUN 10  CREATININE 3.16*  CALCIUM 9.3  MG 2.0  PHOS --   Liver Function Tests:  Lab 01/12/12 1710  AST 22  ALT 11  ALKPHOS 84  BILITOT 0.2*  PROT 6.6  ALBUMIN 3.5   No results found for this basename: LIPASE:5,AMYLASE:5 in the last 168 hours No results found for this basename: AMMONIA:5 in the last 168 hours CBC:  Lab 01/12/12 1710  WBC 7.2  NEUTROABS 4.5  HGB 13.1  HCT 39.0  MCV 99.0  PLT 217   Cardiac Enzymes: No results found for this basename: CKTOTAL:5,CKMB:5,CKMBINDEX:5,TROPONINI:5 in the last 168  hours  CBG: No results found for this basename: GLUCAP:5 in the last 168 hours   Radiological Exams on Admission: Dg Chest 1 View  01/12/2012  *RADIOLOGY REPORT*  Clinical Data: Status epilepticus  CHEST - 1 VIEW  Comparison: None  Findings: Elevated left hemidiaphragm.  Left lower lobe airspace disease may represent atelectasis.  No prior studies available. Right lung is clear.  Negative for heart failure.  IMPRESSION: Elevated left hemidiaphragm and left lower lobe atelectasis.   Original Report Authenticated By: Truett Perna, M.D.    Ct Head Wo Contrast  01/12/2012  *RADIOLOGY REPORT*  Clinical Data: Seizures  CT HEAD WITHOUT CONTRAST  Technique:  Contiguous axial images were obtained from the base of the skull through the vertex without contrast.  Comparison: None.  Findings: Study is suboptimal due to motion artifact.  Endotracheal tube is partially visualized.  There is a probable mucous  retention cyst left maxillary sinus measures 1.3 cm.  No skull fracture is noted.  No intracranial hemorrhage, mass effect or midline shift.  No definite acute cortical infarction. No mass lesion is noted on this unenhanced scan.  IMPRESSION: No acute intracranial abnormality.  Suboptimal study due to motion. Endotracheal tube is partially visualized.   Original Report Authenticated By: Lahoma Crocker, M.D.     Assessment/Plan Present on Admission:  .RENAL INSUFFICIENCY, CHRONIC .SEIZURE DISORDER Hypokalemia Subtherapeutic anticonvulsive drug.  PLAN:  Seizure with subtherapeutic ACD.  I will admit him to telemetry.  Appreciate neurology's help.  Will finish the Depakote load and increase maintenance dose.  He also has CDK on HD, and I will give him only one dose of his KCl.  He is waking up from his seizure, and is doing well.  Will admit him to telemetry under [facility] service.  His family was at his bedside and I have updated them with the plans.  Please note the "endotracheal tube" seen in the CT report was simply the nasal trumpet.  He was not intubated at any time.  He is stable and is a full code.  Other plans as per orders.  Code Status: FULL Haskel Khan, MD. [facility] Pager [phone] 7pm to 7am.  01/13/2012, 12:22 AM

## 2012-01-13 NOTE — Progress Notes (Signed)
Patient arrived to the floor via stretcher from the ED.  Wife, mother, and son at bedside.  Patient transferred to bed.  Placed on O2 @ 2LPM via [place], telemetry placed, seizure precautions initiated.  Patient drowsy but arousable.  Delayed response at time to questions.  A&O x 4.  Family stayed for several minutes then left for home.  Advised that patient would be on bed alarm and considered high fall risk.  Answered family's questions and told them that they could call during the night with any questions.  Will continue to monitor patient through the night.  Aniela Caniglia, Thea Gist

## 2012-01-13 NOTE — Progress Notes (Signed)
[facility] PROGRESS NOTE  Peter Sloan [id number] DOB: December 01, 1960 DOA: 01/12/2012 PCP: No primary provider on file.  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 Doylene Canard    Disposition Plan:   Home when medically stable     Procedures/Studies: Dg Chest 1 View  01/12/2012  *RADIOLOGY REPORT*  Clinical Data: Status epilepticus  CHEST - 1 VIEW  Comparison: None  Findings: Elevated left hemidiaphragm.  Left lower lobe airspace disease may represent atelectasis.  No prior studies available. Right lung is clear.  Negative for heart failure.  IMPRESSION: Elevated left hemidiaphragm and left lower lobe atelectasis.   Original Report Authenticated By: Truett Perna, M.D.    Ct Head Wo Contrast  01/12/2012  *RADIOLOGY REPORT*  Clinical Data: Seizures  CT HEAD WITHOUT CONTRAST  Technique:  Contiguous axial images were obtained from the base of the skull through the vertex without contrast.  Comparison: None.  Findings: Study is suboptimal due to motion artifact.  Endotracheal tube is partially visualized.  There is a probable mucous retention cyst left maxillary sinus measures 1.3 cm.  No skull fracture is noted.  No intracranial hemorrhage, mass effect or midline shift.  No definite acute cortical infarction. No mass lesion is noted on this unenhanced scan.  IMPRESSION: No acute intracranial abnormality.  Suboptimal study due to motion. Endotracheal tube is partially visualized.   Original Report Authenticated By: Lahoma Crocker, M.D.          Subjective: Patient is  lucid this morning. He denies any fevers, chills, chest discomfort, shortness breath, nausea, vomiting, abdominal pain, dysuria, rashes.  Objective: Filed Vitals:   01/13/12 [id number] 01/13/12 0734 01/13/12 0911 01/13/12 0938  BP: 94/65  92/62 100/57  Pulse: 81  82 88  Temp: 98 F (36.7 C)  97.8 F (36.6 C) 98.2 F (36.8 C)  TempSrc: Oral  Oral Oral  Resp: 18  18 17   Height:  5\' 4"  (1.626 m)    Weight:    73 kg (160 lb 15 oz)  SpO2: 98%  97% 100%    Intake/Output Summary (Last 24 hours) at 01/13/12 1045 Last data filed at 01/13/12 0900  Gross per 24 hour  Intake    480 ml  Output      0 ml  Net    480 ml   Weight change:  Exam:   General:  Pt is alert, follows commands appropriately, not in acute distress  HEENT: No icterus, No thrush, No neck mass, [place]/AT  Cardiovascular: RRR, S1/S2, no rubs, no gallops  Respiratory: Left basilar crackles. Heart clear to auscultation. No wheezes or rhonchi bilateral.  Abdomen: Soft/+BS, non tender, non distended, no guarding  Extremities: trace edema, No lymphangitis, No petechiae, No rashes, no synovitis  Data Reviewed: Basic Metabolic Panel:  Lab 99991111 1710  NA 139  K 3.1*  CL 99  CO2 27  GLUCOSE 111*  BUN 10  CREATININE 3.16*  CALCIUM 9.3  MG 2.0  PHOS --   Liver Function Tests:  Lab 01/12/12 1710  AST 22  ALT 11  ALKPHOS 84  BILITOT 0.2*  PROT 6.6  ALBUMIN 3.5   No results found for this basename: LIPASE:5,AMYLASE:5 in the last 168 hours No results found for this basename: AMMONIA:5 in  the last 168 hours CBC:  Lab 01/12/12 1710  WBC 7.2  NEUTROABS 4.5  HGB 13.1  HCT 39.0  MCV 99.0  PLT 217   Cardiac Enzymes: No results found for this basename: CKTOTAL:5,CKMB:5,CKMBINDEX:5,TROPONINI:5 in the last 168 hours BNP: No components found with this basename: POCBNP:5 CBG: No results found for this basename: GLUCAP:5 in the last 168 hours  Recent Results (from the past 240 hour(s))  MRSA PCR SCREENING      Status: Normal   Collection Time   01/12/12  9:34 PM      Component Value Range Status Comment   MRSA by PCR NEGATIVE  NEGATIVE Final      Scheduled Meds:   . allopurinol  300 mg Oral QPM  . amiodarone  200 mg Oral Daily  . antiseptic oral rinse  15 mL Mouth Rinse BID  . aspirin EC  81 mg Oral BH-q7a  . diltiazem  90 mg Oral BID  . divalproex  750 mg Oral BID  . divalproex  750 mg Oral Once  . heparin  5,000 Units Subcutaneous Q8H  . lamoTRIgine  50 mg Oral BID  . LORazepam  2 mg Intravenous Once  . multivitamin  1 tablet Oral QHS  . potassium chloride  10 mEq Oral Once  . simvastatin  20 mg Oral q1800  . sodium chloride  1,000 mL Intravenous Once  . sodium chloride  3 mL Intravenous Q12H  . sodium chloride  3 mL Intravenous Q12H  . valproate sodium  500 mg Intravenous Once  . DISCONTD: sodium chloride   Intravenous STAT  . DISCONTD: divalproex  500 mg Oral BID  . DISCONTD: LamoTRIgine  1 tablet Oral QHS  . DISCONTD: LORazepam  2 mg Intravenous Once  . DISCONTD: valproate  500 mg Intravenous Once   Continuous Infusions:    Keiandra Sullenger, DO  [facility] Pager [phone]  If 7PM-7AM, please contact night-coverage [email or web address] Password [email or web address] 01/13/2012, 10:45 AM   LOS: 1 day

## 2012-01-13 NOTE — Discharge Summary (Signed)
Physician Discharge Summary  Peter Sloan [id number] DOB: July 10, 1960 DOA: 01/12/2012  PCP: No primary provider on file.  Admit date: 01/12/2012 Discharge date: 01/13/2012  Recommendations for Outpatient Follow-up:  1. Pt will need to follow up with PCP in 1 weeks post discharge 2. Followup with neurologist in one week--follow up lamotrigine level  Discharge Diagnoses:  Status epilepticus  -Urine drug screen, Etoh negative  -Subtherapeutic VPA level  -Appreciate neurology recommendations, continue Lamictal and VPA  -Patient was given a load of intravenous valproate -Patient did not have any further seizures, and was lucid -I personally discussed with Dr. Wallie Sloan today whom cleared the patient for discharge today with instructions to continue Lamictal 50 mg twice a day and to increase Depakote ER to 750 mg twice a day -Patient received Depakote ER 750 mg this morning and Depakote ER 750 mg immediately prior to his discharge this evening -Patient was given a prescription for Depakote ER 250 mg for twice a day dosing with instructions to take 250 mg dose twice a day in addition to the 500 mg dose twice a day making 750mg  BID--he will start this AM 01/14/12 -The patient and his wife expressed understanding and states that they will pick up the prescription tonight from [facility].  They stated they have plenty of the Depakote ER 500mg  tablets at home. CKD/ESRD  -On dialysis Tuesday/Thursday/Saturday  -Clinically stable without any signs of fluid overload Hypokalemia  -Repleted  Hyperlipidemia  -Continue Zocor  Nonischemic cardiomyopathy/history of fibrillation  -Continue amiodarone/diltiazem  -follows Dr. Doylene Sloan -Remained in sinus rhythm   Discharge Condition: Stable  Disposition:  discharge home  Diet: Cardiac Wt Readings from Last 3 Encounters:  01/13/12 73 kg (160 lb 15 oz)  08/17/08 81.194 kg (179 lb)  04/29/08 83.416 kg (183 lb 14.4 oz)    History of present  illness:  51 y.o. male with a history of seizure disorder since he was a small child who sees Dr. Gaynell Sloan as an outpatient. Today he had his first episode of status epilepticus. His wife reports that she heard a noise from the study and went to investigate and found him shaking. This lasted for approximately 15 minutes, and he paused briefly, then began disease again. This lasted until he was brought to the emergency room and given Ativan.    Hospital Course:  In the ER, the patient began to wake up and neurology was consulted to see the patient for further medication recommendations. The patient had been seizure free for approximately one year and one half on his current regimen of Depakote 500 mg twice a day and lamotrigine 100 mg twice a day. Neurology saw the patient in the emergency department. He was noted to have a subtherapeutic Depakote level of 36.8. Neurology gave the patient an intravenous load of Depakote and increase his Depakote to 750 mg twice a day while continuing on his lamotrigine 50 mg twice a day. The patient remained lucid without any further seizures. His potassium was repleted. Her urine drug screen was negative. UA did not suggest any pyuria. He was maintained on his home amiodarone and diltiazem without any dysrhythmias. His lamotrigine level was pending at the time of discharge and this needs to be followed up on he follows up with his neurologist and primary care provider. I spoke personally with Dr. Wallie Sloan and cleared the patient for discharge today.  Consultants: Neurology--Dr. Wallie Sloan  Discharge Exam: Filed Vitals:   01/13/12 1800  BP: 109/67  Pulse: 96  Temp: 98 F (36.7 C)  Resp: 17   Filed Vitals:   01/13/12 0911 01/13/12 0938 01/13/12 1320 01/13/12 1800  BP: 92/62 100/57 101/74 109/67  Pulse: 82 88 91 96  Temp: 97.8 F (36.6 C) 98.2 F (36.8 C) 98.3 F (36.8 C) 98 F (36.7 C)  TempSrc: Oral Oral Oral Oral  Resp: 18 17 18 17   Height:       Weight:  73 kg (160 lb 15 oz)    SpO2: 97% 100% 98% 99%   General: A&O x 3, NAD, pleasant, cooperative Cardiovascular: RRR, no rub, no gallop, no S3 Respiratory: fine left basilar crackle, right lung clear to auscultation;  no wheeze, no rhonchi Abdomen:soft, nontender, nondistended, positive bowel sounds Extremities: trace edema, No lymphangitis, no petechiae  Discharge Instructions  Discharge Orders    Future Orders Please Complete By Expires   Diet - low sodium heart healthy      Increase activity slowly      Discharge instructions      Comments:   Follow up with outpatient neurology in one week Take Depakote ER 250mg  twice a day IN ADDITION to Depakote ER 500mg  twice a day       Medication List     As of 01/13/2012  7:43 PM    TAKE these medications         allopurinol 300 MG tablet   Commonly known as: ZYLOPRIM   Take 300 mg by mouth every evening.      amiodarone 200 MG tablet   Commonly known as: PACERONE   Take 200 mg by mouth daily.      aspirin 81 MG tablet   Take 81 mg by mouth every morning.      b complex-vitamin c-folic acid 0.8 MG Tabs   Take 0.8 mg by mouth daily.      diltiazem 90 MG tablet   Commonly known as: CARDIZEM   Take 90 mg by mouth 2 (two) times daily.      DEPAKOTE ER 500 MG 24 hr tablet   Generic drug: divalproex   Take 500 mg by mouth 2 (two) times daily.      divalproex 250 MG 24 hr tablet   Commonly known as: DEPAKOTE ER   Take 1 tablet (250 mg total) by mouth 2 (two) times daily.      LamoTRIgine 50 MG Tbdp   Take 1 tablet by mouth at bedtime.      pravastatin 40 MG tablet   Commonly known as: PRAVACHOL   Take 40 mg by mouth at bedtime.         The results of significant diagnostics from this hospitalization (including imaging, microbiology, ancillary and laboratory) are listed below for reference.    Significant Diagnostic Studies: Dg Chest 1 View  01/12/2012  *RADIOLOGY REPORT*  Clinical Data: Status  epilepticus  CHEST - 1 VIEW  Comparison: None  Findings: Elevated left hemidiaphragm.  Left lower lobe airspace disease may represent atelectasis.  No prior studies available. Right lung is clear.  Negative for heart failure.  IMPRESSION: Elevated left hemidiaphragm and left lower lobe atelectasis.   Original Report Authenticated By: Truett Perna, M.D.    Ct Head Wo Contrast  01/12/2012  *RADIOLOGY REPORT*  Clinical Data: Seizures  CT HEAD WITHOUT CONTRAST  Technique:  Contiguous axial images were obtained from the base of the skull through the vertex without contrast.  Comparison: None.  Findings: Study is suboptimal due to motion artifact.  Endotracheal tube is partially visualized.  There is a probable mucous retention cyst left maxillary sinus measures 1.3 cm.  No skull fracture is noted.  No intracranial hemorrhage, mass effect or midline shift.  No definite acute cortical infarction. No mass lesion is noted on this unenhanced scan.  IMPRESSION: No acute intracranial abnormality.  Suboptimal study due to motion. Endotracheal tube is partially visualized.   Original Report Authenticated By: Lahoma Crocker, M.D.      Microbiology: Recent Results (from the past 240 hour(s))  MRSA PCR SCREENING     Status: Normal   Collection Time   01/12/12  9:34 PM      Component Value Range Status Comment   MRSA by PCR NEGATIVE  NEGATIVE Final      Labs: Basic Metabolic Panel:  Lab 99991111 0950 01/12/12 1710  NA 141 139  K 4.0 3.1*  CL 102 99  CO2 29 27  GLUCOSE 87 111*  BUN 18 10  CREATININE 4.71* 3.16*  CALCIUM 9.3 9.3  MG -- 2.0  PHOS -- --   Liver Function Tests:  Lab 01/12/12 1710  AST 22  ALT 11  ALKPHOS 84  BILITOT 0.2*  PROT 6.6  ALBUMIN 3.5   No results found for this basename: LIPASE:5,AMYLASE:5 in the last 168 hours No results found for this basename: AMMONIA:5 in the last 168 hours CBC:  Lab 01/12/12 1710  WBC 7.2  NEUTROABS 4.5  HGB 13.1  HCT 39.0  MCV 99.0  PLT 217     Cardiac Enzymes: No results found for this basename: CKTOTAL:5,CKMB:5,CKMBINDEX:5,TROPONINI:5 in the last 168 hours BNP: No components found with this basename: POCBNP:5 CBG: No results found for this basename: GLUCAP:5 in the last 168 hours  Time coordinating discharge:  Greater than 30 minutes  Signed:  Joshlyn Beadle, DO [facility] Pager: [phone] 01/13/2012, 7:43 PM

## 2012-07-05 ENCOUNTER — Encounter (HOSPITAL_COMMUNITY): Payer: Self-pay | Admitting: Emergency Medicine

## 2012-07-05 ENCOUNTER — Emergency Department (HOSPITAL_COMMUNITY)
Admission: EM | Admit: 2012-07-05 | Discharge: 2012-07-05 | Disposition: A | Discharge status: Home / Self Care | Payer: Medicare Other | Attending: Emergency Medicine | Admitting: Emergency Medicine

## 2012-07-05 DIAGNOSIS — Z7982 Long term (current) use of aspirin: Secondary | ICD-10-CM | POA: Insufficient documentation

## 2012-07-05 DIAGNOSIS — R630 Anorexia: Secondary | ICD-10-CM | POA: Insufficient documentation

## 2012-07-05 DIAGNOSIS — Z79899 Other long term (current) drug therapy: Secondary | ICD-10-CM | POA: Insufficient documentation

## 2012-07-05 DIAGNOSIS — Z8679 Personal history of other diseases of the circulatory system: Secondary | ICD-10-CM | POA: Insufficient documentation

## 2012-07-05 DIAGNOSIS — G40909 Epilepsy, unspecified, not intractable, without status epilepticus: Secondary | ICD-10-CM

## 2012-07-05 DIAGNOSIS — I4891 Unspecified atrial fibrillation: Secondary | ICD-10-CM | POA: Insufficient documentation

## 2012-07-05 DIAGNOSIS — R5383 Other fatigue: Secondary | ICD-10-CM | POA: Insufficient documentation

## 2012-07-05 DIAGNOSIS — N186 End stage renal disease: Secondary | ICD-10-CM | POA: Insufficient documentation

## 2012-07-05 DIAGNOSIS — R5381 Other malaise: Secondary | ICD-10-CM | POA: Insufficient documentation

## 2012-07-05 DIAGNOSIS — M109 Gout, unspecified: Secondary | ICD-10-CM | POA: Insufficient documentation

## 2012-07-05 DIAGNOSIS — Z87718 Personal history of other specified (corrected) congenital malformations of genitourinary system: Secondary | ICD-10-CM | POA: Insufficient documentation

## 2012-07-05 HISTORY — DX: Unspecified atrial fibrillation: I48.91

## 2012-07-05 LAB — POCT I-STAT, CHEM 8
BUN: 10 mg/dL (ref 6–23)
Chloride: 97 mEq/L (ref 96–112)
Creatinine, Ser: 3.2 mg/dL — ABNORMAL HIGH (ref 0.50–1.35)
Glucose, Bld: 81 mg/dL (ref 70–99)
Hemoglobin: 13.9 g/dL (ref 13.0–17.0)
Potassium: 3.6 mEq/L (ref 3.5–5.1)
Sodium: 137 mEq/L (ref 135–145)

## 2012-07-05 MED ORDER — VALPROATE SODIUM 500 MG/5ML IV SOLN
500.0000 mg | Freq: Once | INTRAVENOUS | Status: AC
  Administered 2012-07-05: 500 mg via INTRAVENOUS
  Filled 2012-07-05: qty 5

## 2012-07-05 NOTE — ED Notes (Signed)
Pt given fluids and sandwich.

## 2012-07-05 NOTE — ED Notes (Signed)
Waiting for medication to finish before discharge

## 2012-07-05 NOTE — ED Notes (Signed)
Waiting for medication from pharmacy to arrive

## 2012-07-05 NOTE — ED Notes (Signed)
Phlebotomy at bedside.

## 2012-07-05 NOTE — ED Provider Notes (Signed)
History     CSN: [id number]  Arrival date & time 07/05/12  1854   First MD Initiated Contact with Patient 07/05/12 1858      Chief Complaint  Patient presents with  . Seizures    (Consider location/radiation/quality/duration/timing/severity/associated sxs/prior treatment) HPI Comments: Presents from home with witnessed seizure by wife. She reports tonic-clonic activity for 30 seconds with tongue biting. Responsive on EMS arrival answering questions appropriately. Had dialysis today per his usual scheduled patient thinks they may have taken off too much fluid. Denies any chest pain, shortness of breath, fever, cough. States compliance with her seizure medications Depakote and lamotrigine. Blood sugar was 66 on arrival. He is awake and alert and states he feels back to normal. Admits to poor by mouth intake in preparation for his colonoscopy next week. No history of diabetes.  The history is provided by the patient and the EMS personnel.    Past Medical History  Diagnosis Date  . Congenital solitary kidney   . Chronic renal insufficiency     2/2 ischemic ATN, GFR 15-17 ml/min, baseline Cr: 4-4.5, never had a renal biopsy, s/p AV fistula placement in 2007, f/b nephrologist, Dr. Dorothe Pea, [place]  . Nonischemic cardiomyopathy     EF 20%(9/06) with most recent EF 50%(3/08), valves normal per 2D echo (3/08), normal doppler and color flow study(3/08) s/p endocmyocardial  biopsy: mild myocyte hypertrophy no myocyte damage or inflammation  . Seizure disorder     complex- partial, h/o sz disorder as a child  . Gout     h/o: many in Rt big toe and Rt Knee, never had aspirartion diagnosis  . Atrial fibrillation     History reviewed. No pertinent past surgical history.  Family History  Problem Relation Age of Onset  . Osteoarthritis Mother   . Hypertension Father   . Muscular dystrophy Brother   . Heart disease Child     one 23 y/o son has MVP    History  Substance Use Topics   . Smoking status: Never Smoker   . Smokeless tobacco: Not on file  . Alcohol Use: No     Comment: social drinker      Review of Systems  Constitutional: Positive for activity change, appetite change and fatigue. Negative for fever.  Respiratory: Negative for cough, chest tightness and shortness of breath.   Cardiovascular: Negative for chest pain.  Gastrointestinal: Negative for nausea, vomiting and abdominal pain.  Genitourinary: Negative for dysuria and hematuria.  Musculoskeletal: Negative for back pain.  Skin: Positive for wound.  Neurological: Positive for seizures and weakness.    Allergies  Diphenhydramine hcl  Home Medications   Current Outpatient Rx  Name  Route  Sig  Dispense  Refill  . allopurinol (ZYLOPRIM) 300 MG tablet   Oral   Take 300 mg by mouth every evening.         Marland Kitchen amiodarone (PACERONE) 200 MG tablet   Oral   Take 200 mg by mouth daily.         Marland Kitchen aspirin 81 MG tablet   Oral   Take 81 mg by mouth every morning.          Marland Kitchen b complex-vitamin c-folic acid (NEPHRO-VITE) 0.8 MG TABS   Oral   Take 0.8 mg by mouth daily.         . cinacalcet (SENSIPAR) 30 MG tablet   Oral   Take 30 mg by mouth every other day.         Marland Kitchen  diltiazem (CARDIZEM) 90 MG tablet   Oral   Take 90 mg by mouth 2 (two) times daily.         . divalproex (DEPAKOTE ER) 250 MG 24 hr tablet   Oral   Take 250 mg by mouth 2 (two) times daily. Take with 500 mg tablet twice daily for a total of 750 mg twice daily.         . divalproex (DEPAKOTE ER) 500 MG 24 hr tablet   Oral   Take 500 mg by mouth 2 (two) times daily. Take with 250 mg tablet twice daily for a total of 750 mg twice daily         . LamoTRIgine 50 MG TBDP   Oral   Take 1 tablet by mouth 2 (two) times daily.          . pravastatin (PRAVACHOL) 40 MG tablet   Oral   Take 40 mg by mouth at bedtime.            BP 130/87  Pulse 81  Temp(Src) 98.5 F (36.9 C) (Oral)  Resp 17  SpO2  98%  Physical Exam  Constitutional: He is oriented to person, place, and time. He appears well-developed and well-nourished. No distress.  HENT:  Head: Normocephalic and atraumatic.  Mouth/Throat: Oropharynx is clear and moist. No oropharyngeal exudate.  Small abrasion/laceration to right tonghue  Eyes: Conjunctivae and EOM are normal. Pupils are equal, round, and reactive to light.  Neck: Normal range of motion. Neck supple.  Cardiovascular: Normal rate, regular rhythm and normal heart sounds.   No murmur heard. Pulmonary/Chest: Effort normal and breath sounds normal. No respiratory distress.  Abdominal: Soft. There is no tenderness. There is no rebound and no guarding.  Musculoskeletal: Normal range of motion. He exhibits no edema and no tenderness.  Left upper extremity the fistula with thrill and bruit  Neurological: He is alert and oriented to person, place, and time. No cranial nerve deficit. He exhibits normal muscle tone. Coordination normal.  Skin: Skin is warm.    ED Course  Procedures (including critical care time)  Labs Reviewed  VALPROIC ACID LEVEL - Abnormal; Notable for the following:    Valproic Acid Lvl 38.7 (*)    All other components within normal limits  GLUCOSE, CAPILLARY - Abnormal; Notable for the following:    Glucose-Capillary 66 (*)    All other components within normal limits  POCT I-STAT, CHEM 8 - Abnormal; Notable for the following:    Creatinine, Ser 3.20 (*)    All other components within normal limits  TROPONIN I   No results found.   No diagnosis found.    MDM  ESRD with seizure disorder presenting with seizure-like activity now resolved. Back to baseline per patient and family. Did not hit head.  Depakote level subtherapeutic. Discussed with Dr. Armida Sans of neurology. We'll increase Depakote to 750 mg 3 times a day, continue Lamotrigine 50 mg twice daily. We'll load IV Depakote now.  Patient is awake and alert and in no distress. Family  confirms he is at his baseline.     Ezequiel Essex, MD 07/05/12 2249

## 2012-07-05 NOTE — ED Notes (Signed)
Witnessed seizure from patient's wife. Tremors lasting 30 seconds.  Per EMS states alert to his normal now answering and following commands appropriate. Last seizure 6 months ago.

## 2012-07-05 NOTE — ED Notes (Signed)
RN offered pt fluids, pt denied.

## 2012-07-05 NOTE — ED Notes (Signed)
MD at bedside talking with family

## 2012-07-08 ENCOUNTER — Telehealth: Payer: Self-pay | Admitting: Diagnostic Neuroimaging

## 2012-07-08 NOTE — Telephone Encounter (Signed)
[name] calling about pt and had seizure over the weekend.  Depakote increased to 750 mg po tid and lamictal 50mg  po bid.  They think to much.  Would like to come into the office tomorrow afternoon. [id number].  I called and LMVM for wife re: pts sx of increase of depakote.

## 2012-07-09 ENCOUNTER — Telehealth: Payer: Self-pay

## 2012-07-09 NOTE — Telephone Encounter (Signed)
Spouse called requesting refills via Patient Assistance Program for Depakote ER 250mg  and Depakote ER 500mg .  I called PAP at [phone].  Both meds have been ordered and will arrive in 7-10 business days.  I will notify patient when they are ready for pick up.

## 2012-07-09 NOTE — Telephone Encounter (Signed)
I called the patient and his wife and reviewed hospital records. Patient had breakthrough seizure recently and low Depakote level. Depakote dose was increased to 750 mg 3 times a day. He is having slightly increased tremor. I decided to adjust his medication to Depakote 1000 mg twice a day.he should continue on a monitor seen 50 mg twice a day. I will see patient next week in followup.  Penni Bombard, MD [id number], [id number] PM Certified in Neurology, Neurophysiology and Neuroimaging  [facility] [address], [place], [place] [address] [phone]

## 2012-07-09 NOTE — Telephone Encounter (Signed)
I called and spoke to son, and also wife of pt on cell.  LMVM for her that will touch base with Dr. Leta Baptist about calling since not able to see until next week. Call at 1600  [phone], [phone]

## 2012-07-10 ENCOUNTER — Ambulatory Visit: Payer: Self-pay | Admitting: Diagnostic Neuroimaging

## 2012-07-18 ENCOUNTER — Ambulatory Visit: Payer: Self-pay | Admitting: Diagnostic Neuroimaging

## 2012-07-21 ENCOUNTER — Ambulatory Visit: Payer: Self-pay | Admitting: Diagnostic Neuroimaging

## 2012-07-24 ENCOUNTER — Telehealth: Payer: Self-pay

## 2012-07-24 NOTE — Telephone Encounter (Signed)
PAP meds arrived in the mail, ready for pick up.  I called patient, got no answer.  Left message.

## 2012-07-28 ENCOUNTER — Ambulatory Visit (INDEPENDENT_AMBULATORY_CARE_PROVIDER_SITE_OTHER): Payer: Medicare Other | Admitting: Diagnostic Neuroimaging

## 2012-07-28 ENCOUNTER — Encounter: Payer: Self-pay | Admitting: Diagnostic Neuroimaging

## 2012-07-28 VITALS — BP 130/87 | HR 88 | Ht 63.0 in | Wt 159.0 lb

## 2012-07-28 DIAGNOSIS — G40909 Epilepsy, unspecified, not intractable, without status epilepticus: Secondary | ICD-10-CM

## 2012-07-28 NOTE — Progress Notes (Signed)
[facility]  PATIENT: Peter Sloan DOB: [birth year]  REFERRING CLINICIAN:  HISTORY FROM: patient and mother REASON FOR VISIT: follow up   HISTORICAL  CHIEF COMPLAINT:  Chief Complaint  Patient presents with  . Seizures    follow-up    HISTORY OF PRESENT ILLNESS:   UPDATE 07/28/12: Since last visit, had breakthrough sz after dialysis session (told me that double volume was removed inadvertantly). Since then I re-adjusted his meds to VPA 1000mg  BID and LTG 50mg  BID. Now in review of records, I think he may still be on 25mg  BID, but he is not sure.  UPDATE 05/28/12: Since last visit, no sz. Tolerating meds. No new events.   UPDATE 02/20/12: Since last visit, now on dialysis. Also, had breakthrough sz on 01/11/12 (admitted x 1 day). Now on incr VPA 750mg  BID and continues on LTG 25mg  BID. No triggering factors for breakthrough sz.  UPDATE 06/26/11: Last convulsive seizure was in February 2012. Most recent abnormal spell was 05/05/2011, following a stress test.  He had been laying down at home, felt a abnormal sensation in the left occipital region with a pop sensation, followed by a rush sensation in his face down to his stomach with a sour stomach sensation.  He thinks this may have been a petit mall seizure.  He was fully aware and conscious during the episode.  Episode was similar but somewhat different than his prior episodes.  In response to this event he was started on low dose Lamictal 25 mg twice a day.  Since that time he has had no further events. PRIOR HPI: 52 -year-old, right handed, Caucasian male who returns today for followup for history of longstanding seizure disorder. He has a single kidney with renal dysfunction. 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 He has never required dialysis however he is on the transplant list at [facility].  His last major  seizure was 05/15/10 and seen in the ER. No loss of B/B, did not bite his tongue.  He has had a total  of 2 seizures over the last few years.   He has had problems with thrombocytopenia in the past . His Depakote dose was reduced and he was seen by a hematologist. The platelet count has returned to normal. He has history of HTN, atrial fib, hypercholesterolemia , and renal failure. Recent labs reviewed from Dr. Deterding's office. Lamictal added at last visit, low dose.  No further seizure activity.See ROS.  REVIEW OF SYSTEMS: Full 14 system review of systems performed and notable only for seizure.  ALLERGIES: Allergies  Allergen Reactions  . Benadryl (Diphenhydramine Hcl)   . Diphenhydramine Hcl     Heart palpitations    HOME MEDICATIONS: Outpatient Prescriptions Prior to Visit  Medication Sig Dispense Refill  . divalproex (DEPAKOTE ER) 500 MG 24 hr tablet Take 1,000 mg by mouth 2 (two) times daily.       Marland Kitchen allopurinol (ZYLOPRIM) 300 MG tablet Take 300 mg by mouth every evening.      Marland Kitchen amiodarone (PACERONE) 200 MG tablet Take 200 mg by mouth daily.      Marland Kitchen aspirin 81 MG tablet Take 81 mg by mouth every morning.       Marland Kitchen b complex-vitamin c-folic acid (NEPHRO-VITE) 0.8 MG TABS Take 0.8 mg by mouth daily.      Marland Kitchen diltiazem (CARDIZEM) 90 MG tablet Take 90 mg by mouth 2 (two) times daily.      . pravastatin (PRAVACHOL) 40  MG tablet Take 40 mg by mouth at bedtime.       . cinacalcet (SENSIPAR) 30 MG tablet Take 30 mg by mouth every other day.      . divalproex (DEPAKOTE ER) 250 MG 24 hr tablet Take 250 mg by mouth 2 (two) times daily. Take with 500 mg tablet twice daily for a total of 750 mg twice daily.      . LamoTRIgine 50 MG TBDP Take 1 tablet by mouth 2 (two) times daily.        No facility-administered medications prior to visit.    PAST MEDICAL HISTORY: Past Medical History  Diagnosis Date  . Congenital solitary kidney   . Chronic renal insufficiency     2/2 ischemic ATN, GFR 15-17 ml/min, baseline Cr: 4-4.5, never had a renal biopsy, s/p AV fistula placement in 2007, f/b  nephrologist, Dr. Dorothe Pea, [place]  . Nonischemic cardiomyopathy     EF 20%(9/06) with most recent EF 50%(3/08), valves normal per 2D echo (3/08), normal doppler and color flow study(3/08) s/p endocmyocardial  biopsy: mild myocyte hypertrophy no myocyte damage or inflammation  . Seizure disorder     complex- partial, h/o sz disorder as a child  . Gout     h/o: many in Rt big toe and Rt Knee, never had aspirartion diagnosis  . Atrial fibrillation   . Thyroid disorder   . High cholesterol   . Seizures   . Congestive heart failure   . Hyperparathyroidism     PAST SURGICAL HISTORY: Past Surgical History  Procedure Laterality Date  . Kidney surgery    . Tonsillectomy    . Hernia repair      FAMILY HISTORY: Family History  Problem Relation Age of Onset  . Osteoarthritis Mother   . Hypertension Father   . Muscular dystrophy Brother   . Heart disease Child     one 73 y/o son has MVP    SOCIAL HISTORY:  History   Social History  . Marital Status: Married    Spouse Name: Lattie Haw    Number of Children: N/A  . Years of Education: HS   Occupational History  . Currently unemployed     used to [employer] as Scientist, clinical (histocompatibility and immunogenetics) in office in [place]  . Smoking status: Never Smoker   . Smokeless tobacco: Never Used  . Alcohol Use: No     Comment: social drinker  . Drug Use: No  . Sexually Active: Yes    Birth Control/ Protection: None   Other Topics Concern  . Not on file   Social History Narrative   Moved from [place] on 08/12/06.   Lives with parents in [place].   Has been married for 29yrs.                    PHYSICAL EXAM  Filed Vitals:   07/28/12 1315  BP: 130/87  Pulse: 88  Height: 5\' 3"  (1.6 m)  Weight: 159 lb (72.122 kg)   Body mass index is 28.17 kg/(m^2).  EXAM: General: Patient is awake, alert and in no acute distress.  Well developed and groomed. Cardiovascular: No carotid artery bruits.  Heart is regular rate and rhythm with no  murmurs.  Neurologic Exam  Mental Status: Awake, alert. Language is fluent and comprehension intact. Cranial Nerves: Pupils are equal and reactive to light.  Visual fields are full to confrontation.  Conjugate eye movements are full and symmetric.  Facial sensation and  strength are symmetric.  Hearing is intact.  Palate elevated symmetrically and uvula is midline.  Shoulder shrug is symmetric.  Tongue is midline. Motor: POSTURAL AND INTENTION TREMOR OF BUE. MILD HEAD TREMOR. Normal bulk and tone.  Full strength in the upper and lower extremities.  No pronator drift. Sensory: Intact and symmetric to light touch. Coordination: No ataxia or dysmetria on finger-nose or rapid alternating movement testing. Gait and Station: Narrow based gait.  Tandem gait is stable.  Romberg is negative.   DIAGNOSTIC DATA (LABS, IMAGING, TESTING) - I reviewed patient records, labs, notes, testing and imaging myself where available.  Lab Results  Component Value Date   WBC 7.2 01/12/2012   HGB 13.9 07/05/2012   HCT 41.0 07/05/2012   MCV 99.0 01/12/2012   PLT 217 01/12/2012      Component Value Date/Time   NA 137 07/05/2012 2051   NA 147* 09/09/2008 1450   K 3.6 07/05/2012 2051   K 5.0* 09/09/2008 1450   CL 97 07/05/2012 2051   CL 111* 09/09/2008 1450   CO2 29 01/13/2012 0950   CO2 26 09/09/2008 1450   GLUCOSE 81 07/05/2012 2051   GLUCOSE 106 09/09/2008 1450   BUN 10 07/05/2012 2051   BUN 51* 09/09/2008 1450   CREATININE 3.20* 07/05/2012 2051   CREATININE 4.7* 09/09/2008 1450   CALCIUM 9.3 01/13/2012 0950   CALCIUM 10.8* 09/09/2008 1450   PROT 6.6 01/12/2012 1710   PROT 6.4 09/09/2008 1450   ALBUMIN 3.5 01/12/2012 1710   AST 22 01/12/2012 1710   AST 24 09/09/2008 1450   ALT 11 01/12/2012 1710   ALKPHOS 84 01/12/2012 1710   ALKPHOS 38 09/09/2008 1450   BILITOT 0.2* 01/12/2012 1710   BILITOT 0.70 09/09/2008 1450   GFRNONAA 13* 01/13/2012 0950   GFRAA 15* 01/13/2012 0950   Lab Results  Component Value Date    CHOL 171 08/17/2008   HDL 41 08/17/2008   LDLCALC 107* 08/17/2008   TRIG 115 08/17/2008   CHOLHDL 4.2 Ratio 08/17/2008   No results found for this basename: HGBA1C   No results found for this basename: VITAMINB12   Lab Results  Component Value Date   TSH 2.046 01/13/2012    01/12/12 CT head - normal   ASSESSMENT AND PLAN  52 y.o. year old male  has a past medical history of Congenital solitary kidney; Chronic renal insufficiency; Nonischemic cardiomyopathy; Seizure disorder; Gout; Atrial fibrillation; Thyroid disorder; High cholesterol; Seizures; Congestive heart failure; and Hyperparathyroidism. here with complex partial with secondary generalization. Last convulsive seizures on 01/12/12 and 06/28/10/14.  PLAN: 1. continue current AEDs (depakote ER 1000mg  BID and LTG 25mg  BID) 2. Patient not sure if he is taking LTG 25 or 50mg  BID. He will check his bottle at home and let us know. According to my last notes from other EMR, he was on 25mg  BID 3. Check LTG, VPA and ammonia levels today for baseline; slightly more tremor today    Penni Bombard, MD [id number], [id number] PM Certified in Neurology, Neurophysiology and Neuroimaging  [facility] [address] [place], [place] [address] [phone]

## 2012-07-28 NOTE — Patient Instructions (Signed)
Continue depakote ER 1000mg  twice a day.  Continue lamotrigine twice a day (let me know if you are taking 25mg  or 50mg  when you get home).

## 2012-07-29 ENCOUNTER — Other Ambulatory Visit: Payer: Self-pay

## 2012-07-29 MED ORDER — LAMOTRIGINE 25 MG PO TABS: 25.0000 mg | ORAL_TABLET | Freq: Two times a day (BID) | ORAL | Status: DC

## 2012-07-29 NOTE — Telephone Encounter (Signed)
Patient called front desk requesting we sent Lamictal to [facility]

## 2012-07-30 LAB — AMMONIA: Ammonia: 48 ug/dL (ref 27–102)

## 2012-07-30 LAB — LAMOTRIGINE LEVEL: Lamotrigine Lvl: 2.4 ug/mL (ref 2.0–20.0)

## 2012-08-04 ENCOUNTER — Telehealth: Payer: Self-pay | Admitting: *Deleted

## 2012-08-04 NOTE — Telephone Encounter (Signed)
I called patient and gave lab results, patient showed understanding .

## 2012-08-04 NOTE — Telephone Encounter (Signed)
Message copied by Joellen Jersey on Mon Aug 04, 2012 12:11 PM ------      Message from: Andrey Spearman      Created: Fri Aug 01, 2012  5:57 PM       Call pt with results. Continue current meds.             -VRP            ----- Message -----         From: [facility] Lab Results In Interface         Sent: 07/30/2012   5:48 AM           To: Penni Bombard, MD                   ------

## 2012-08-14 HISTORY — PX: KIDNEY TRANSPLANT: SHX239

## 2012-09-10 ENCOUNTER — Telehealth: Payer: Self-pay | Admitting: *Deleted

## 2012-09-10 NOTE — Telephone Encounter (Signed)
Mother calling for Peter Sloan.   Peter Sloan had kidney transplant back in May and is having increased tremors.  We are seeing Peter Sloan for seizures.  Noted in his ofv note Peter Sloan has tremors.   Since the kidney transplant and is recovery, noted increased tremors.   Mother asking since going to [facility] tomorrow about changing meds relating to tremors.  I told her that I would be glad to relay to Dr. Leta Baptist about her and pts concerns.  New problem.  Medication adjustment due to all new meds.   She was to fax over to me med list and the sx of what is going on and [facility] MD name for contact.

## 2012-09-12 NOTE — Telephone Encounter (Signed)
Received notes from mother, re: meds.  Dr. Wynn Maudlin called from [facility].  [phone].  Appt made for pt 09-16-12 at 1300. Mother verbalized understanding.

## 2012-09-15 ENCOUNTER — Ambulatory Visit: Payer: Self-pay | Admitting: Diagnostic Neuroimaging

## 2012-09-16 ENCOUNTER — Encounter: Payer: Self-pay | Admitting: Diagnostic Neuroimaging

## 2012-09-16 ENCOUNTER — Ambulatory Visit (INDEPENDENT_AMBULATORY_CARE_PROVIDER_SITE_OTHER): Payer: Medicare Other | Admitting: Diagnostic Neuroimaging

## 2012-09-16 VITALS — BP 110/77 | HR 97 | Temp 97.2°F

## 2012-09-16 DIAGNOSIS — G40909 Epilepsy, unspecified, not intractable, without status epilepticus: Secondary | ICD-10-CM

## 2012-09-16 MED ORDER — LEVETIRACETAM 500 MG PO TABS: 500.0000 mg | ORAL_TABLET | Freq: Two times a day (BID) | ORAL | Status: DC

## 2012-09-16 NOTE — Patient Instructions (Signed)
Add Keppra, we will start to wean other medicines at next visit.

## 2012-09-16 NOTE — Progress Notes (Signed)
[facility]  PATIENT: Peter Sloan DOB: [birth year]   HISTORY FROM: patient REASON FOR VISIT: follow up   HISTORICAL  CHIEF COMPLAINT:  Chief Complaint  Patient presents with  . Follow-up    HISTORY OF PRESENT ILLNESS:  UPDATE 09/16/12: Patient had kidney transplant on Aug 14, 2012.  Hospital admission at [facility] was 10 days.  Ureter stent was placed during transplant surgery which had to be replaced and prolonged the admission.  Discharged with foley catheter, he could not void in 6 hours so foley was replaced.  He currently has foley.  Kidney is functioning, no more HD needed.  He is on week 3 of 6 weeks of in-home therapy.  Ambulates with walker but needed wheelchair for long distances. Has had 9 falls in 12 days.  Appears very deconditioned. Tremors are worse since surgery, affecting writing, feeding self.  Currently on VPA 1000mg  BID and LTG 25mg  BID  UPDATE 07/28/12: Since last visit, had breakthrough sz after dialysis session (told me that double volume was removed inadvertantly). Since then I re-adjusted his meds to VPA 1000mg  BID and LTG 50mg  BID. Now in review of records, I think he may still be on 25mg  BID, but he is not sure.   UPDATE 05/28/12: Since last visit, no sz. Tolerating meds. No new events.  UPDATE 02/20/12: Since last visit, now on dialysis. Also, had breakthrough sz on 01/11/12 (admitted x 1 day). Now on incr VPA 750mg  BID and continues on LTG 25mg  BID. No triggering factors for breakthrough sz.   UPDATE 06/26/11: Last convulsive seizure was in February 2012. Most recent abnormal spell was 05/05/2011, following a stress test. He had been laying down at home, felt a abnormal sensation in the left occipital region with a pop sensation, followed by a rush sensation in his face down to his stomach with a sour stomach sensation. He thinks this may have been a petit mall seizure. He was fully aware and conscious during the episode. Episode was similar but  somewhat different than his prior episodes. In response to this event he was started on low dose Lamictal 25 mg twice a day. Since that time he has had no further events.   PRIOR HPI: 52 -year-old, right handed, Caucasian male who returns today for followup for history of longstanding seizure disorder. He has a single kidney with renal dysfunction. 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 He has never required dialysis however he is on the transplant list at [facility]. His last major seizure was 05/15/10 and seen in the ER. No loss of B/B, did not bite his tongue. He has had a total of 2 seizures over the last few years.  He has had problems with thrombocytopenia in the past . His Depakote dose was reduced and he was seen by a hematologist. The platelet count has returned to normal. He has history of HTN, atrial fib, hypercholesterolemia , and renal failure. Recent labs reviewed from Dr. Deterding's office. Lamictal added at last visit, low dose. No further seizure activity.See ROS.   REVIEW OF SYSTEMS: Full 14 system review of systems performed and notable only for:  Cardiovascular: N/A  Ear/Nose/Throat: N/A  Skin: N/A  Eyes: blurred vision Respiratory: N/A Gastroitestinal: N/A  Hematology/Lymphatic: easy bruising Endocrine: Feeling hot, cold.  Musculoskeletal:N/A  Allergy/Immunology: N/A  Neurological: confusion, numbness, dizziness,  tremor,  Psychiatric: decreased energy, hallucinations, racing thoughts. Sleep: snoring   ALLERGIES: Allergies  Allergen Reactions  . Benadryl (Diphenhydramine Hcl)   .  Diphenhydramine Hcl     Heart palpitations    HOME MEDICATIONS: Outpatient Prescriptions Prior to Visit  Medication Sig Dispense Refill  . allopurinol (ZYLOPRIM) 300 MG tablet Take 300 mg by mouth every evening.      Marland Kitchen amiodarone (PACERONE) 200 MG tablet Take 200 mg by mouth daily.      Marland Kitchen aspirin 81 MG tablet Take 81 mg by mouth every morning.       Marland Kitchen b complex-vitamin  c-folic acid (NEPHRO-VITE) 0.8 MG TABS Take 0.8 mg by mouth daily.      Marland Kitchen diltiazem (CARDIZEM) 90 MG tablet Take 90 mg by mouth 2 (two) times daily.      . divalproex (DEPAKOTE ER) 500 MG 24 hr tablet Take 1,000 mg by mouth 2 (two) times daily.       Marland Kitchen lamoTRIgine (LAMICTAL) 25 MG tablet Take 1 tablet (25 mg total) by mouth 2 (two) times daily.  180 tablet  3  . pravastatin (PRAVACHOL) 40 MG tablet Take 40 mg by mouth at bedtime.        . valGANciclovir (VALCYTE) 450 MG tablet    Sig: Take 450 mg by mouth daily.    PAST MEDICAL HISTORY: Past Medical History  Diagnosis Date  . Congenital solitary kidney   . Chronic renal insufficiency     2/2 ischemic ATN, GFR 15-17 ml/min, baseline Cr: 4-4.5, never had a renal biopsy, s/p AV fistula placement in 2007, f/b nephrologist, Dr. Dorothe Pea, [place]  . Nonischemic cardiomyopathy     EF 20%(9/06) with most recent EF 50%(3/08), valves normal per 2D echo (3/08), normal doppler and color flow study(3/08) s/p endocmyocardial  biopsy: mild myocyte hypertrophy no myocyte damage or inflammation  . Seizure disorder     complex- partial, h/o sz disorder as a child  . Gout     h/o: many in Rt big toe and Rt Knee, never had aspirartion diagnosis  . Atrial fibrillation   . Thyroid disorder   . High cholesterol   . Seizures   . Congestive heart failure   . Hyperparathyroidism     PAST SURGICAL HISTORY: Past Surgical History  Procedure Laterality Date  . Kidney surgery    . Tonsillectomy    . Hernia repair      FAMILY HISTORY: Family History  Problem Relation Age of Onset  . Osteoarthritis Mother   . Hypertension Father   . Muscular dystrophy Brother   . Heart disease Child     one 14 y/o son has MVP    SOCIAL HISTORY: History   Social History  . Marital Status: Married    Spouse Name: Lattie Haw    Number of Children: N/A  . Years of Education: HS   Occupational History  . Currently unemployed     used to [employer] as Scientist, clinical (histocompatibility and immunogenetics) in office  in [place]  . Smoking status: Never Smoker   . Smokeless tobacco: Never Used  . Alcohol Use: No     Comment: social drinker  . Drug Use: No  . Sexually Active: Yes    Birth Control/ Protection: None   Other Topics Concern  . Not on file   Social History Narrative   Moved from [place] on 08/12/06.   Lives with parents in [place].   Has been married for 16yrs.                  PHYSICAL EXAM  Filed Vitals:   09/16/12 1335  BP: 110/77  Pulse: 97  Temp: 97.2 F (36.2 C)  TempSrc: Oral   Cannot calculate BMI with a height equal to zero.  Generalized: In no acute distress, chronically ill-appearing, looks older than stated age.  Neck: Supple, no carotid bruits   Cardiac: Regular rate rhythm, no murmur. LE edema 3+, R>L  Pulmonary: Clear to auscultation bilaterally   Musculoskeletal: No deformity   Skin: Thin, shiny appearing skin, bruising around right eye, visible on BLE.  Neurological examination   Mentation: Alert oriented to time, place, history taking, language fluent, and casual conversation  Cranial nerve II-XII: Pupils were equal round reactive to light, 72mm, extraocular movements were full, visual field were full on confrontational test. facial sensation and strength were normal. hearing was intact to finger rubbing bilaterally. Uvula tongue midline. head turning and shoulder shrug and were normal and symmetric.Tongue protrusion into cheek strength was normal. MOTOR: decreased bulk, increased tone, 4/5 strength in the BUE, BLE, fine finger movements normal, no pronator drift. Moderate BUE Tremor, head tremor  SENSORY: normal and symmetric to light touch, pinprick, temperature, vibration COORDINATION: finger-nose-finger normal REFLEXES: Brachioradialis 3/2, biceps 3/2, triceps 2/2, patellar 1/2, Achilles 1/2. GAIT/STATION: not assessed. In wheelchair.  DIAGNOSTIC DATA (LABS, IMAGING, TESTING) - I reviewed patient records, labs,  notes, testing and imaging myself where available.   ASSESSMENT AND PLAN  52 y.o. year old male  has a past medical history of Congenital solitary kidney; Chronic renal insufficiency; Nonischemic cardiomyopathy; Seizure disorder; Gout; Atrial fibrillation; Thyroid disorder; High cholesterol; Seizures; Congestive heart failure; Hyperparathyroidism, recent kidney transplant here with seizure disorder and tremor.  Seizures have increased in amplitude approximately 25% since renal transplant, unclear if side effect of depakote or possible anti-rejection medication or combination.    PLAN: 1. Seizure free since April, we will add Keppra and then start to taper Lamictal and Depakote at following visits. 2. Check labs today, LFT's, Ammonia, Valproic acid, and lamictal levels. 3. Deconditioned from surgery and hospital admission, continue PT/OT 4. Do not ambulate without aid of walker for stabilization, for fall prevention. 5. Follow up in 4 weeks.   Orders Placed This Encounter  Procedures  . Valproic Acid Level  . AMMONIA  . Hepatic function panel  . Lamotrigine level   Meds ordered this encounter  Medications  . levETIRAcetam (KEPPRA) 500 MG tablet    Sig: Take 1 tablet (500 mg total) by mouth 2 (two) times daily.    Dispense:  180 tablet    Refill:  3    Order Specific Question:  Supervising Provider    Answer:  Penni Bombard [[id number]]     Yoshiye Kraft NP-C 09/16/2012, 2:38 PM  [facility] [address] [place], [place] [address] [phone]

## 2012-09-18 LAB — HEPATIC FUNCTION PANEL
AST: 27 IU/L (ref 0–40)
Albumin: 3.2 g/dL — ABNORMAL LOW (ref 3.5–5.5)
Total Bilirubin: 0.3 mg/dL (ref 0.0–1.2)

## 2012-09-18 LAB — VALPROIC ACID LEVEL: Valproic Acid Lvl: 94 ug/mL (ref 50–100)

## 2012-09-18 NOTE — Progress Notes (Signed)
I reviewed note and agree with plan.   Penni Bombard, MD [id number], [id number] PM Certified in Neurology, Neurophysiology and Neuroimaging  [facility] [address] [place], [place] [address] [phone]

## 2012-09-29 ENCOUNTER — Telehealth: Payer: Self-pay | Admitting: Diagnostic Neuroimaging

## 2012-09-29 NOTE — Telephone Encounter (Signed)
Calling re: increased tremors.

## 2012-09-30 ENCOUNTER — Telehealth: Payer: Self-pay | Admitting: Nurse Practitioner

## 2012-09-30 MED ORDER — DIVALPROEX SODIUM ER 500 MG PO TB24: 500.0000 mg | ORAL_TABLET | Freq: Two times a day (BID) | ORAL | Status: DC

## 2012-09-30 NOTE — Telephone Encounter (Signed)
Spoke to Peter Sloan this morning and made med adjustment.  Thanks. LL

## 2012-09-30 NOTE — Telephone Encounter (Signed)
Patient's mother calling 09/29/12 stating that Greg's tremors are getting worse, cannot feed himself.  Has been seizure free.  We added Keppra last office visit, with plans to wean Depakote at next 4 week visit.    Advised to decrease Depakote ER now to 500mg  BID and keep appt. In 3 weeks.

## 2012-10-13 DIAGNOSIS — I823 Embolism and thrombosis of renal vein: Secondary | ICD-10-CM | POA: Insufficient documentation

## 2012-10-13 HISTORY — DX: Embolism and thrombosis of renal vein: I82.3

## 2012-10-14 HISTORY — PX: NEPHRECTOMY TRANSPLANTED ORGAN: SUR880

## 2012-10-22 ENCOUNTER — Ambulatory Visit: Payer: Medicare Other | Admitting: Diagnostic Neuroimaging

## 2012-11-18 ENCOUNTER — Encounter: Payer: Self-pay | Admitting: Internal Medicine

## 2012-11-18 ENCOUNTER — Non-Acute Institutional Stay (SKILLED_NURSING_FACILITY): Payer: Medicare Other | Admitting: Internal Medicine

## 2012-11-18 DIAGNOSIS — I4891 Unspecified atrial fibrillation: Secondary | ICD-10-CM

## 2012-11-18 DIAGNOSIS — M109 Gout, unspecified: Secondary | ICD-10-CM

## 2012-11-18 DIAGNOSIS — Z940 Kidney transplant status: Secondary | ICD-10-CM

## 2012-11-18 DIAGNOSIS — E785 Hyperlipidemia, unspecified: Secondary | ICD-10-CM

## 2012-11-18 DIAGNOSIS — R5381 Other malaise: Secondary | ICD-10-CM

## 2012-11-18 DIAGNOSIS — I151 Hypertension secondary to other renal disorders: Secondary | ICD-10-CM

## 2012-11-18 DIAGNOSIS — G40909 Epilepsy, unspecified, not intractable, without status epilepticus: Secondary | ICD-10-CM

## 2012-11-18 DIAGNOSIS — E43 Unspecified severe protein-calorie malnutrition: Secondary | ICD-10-CM

## 2012-11-18 DIAGNOSIS — I823 Embolism and thrombosis of renal vein: Secondary | ICD-10-CM

## 2012-11-18 DIAGNOSIS — N186 End stage renal disease: Secondary | ICD-10-CM

## 2012-11-18 DIAGNOSIS — D62 Acute posthemorrhagic anemia: Secondary | ICD-10-CM

## 2012-11-18 DIAGNOSIS — I150 Renovascular hypertension: Secondary | ICD-10-CM

## 2012-11-18 NOTE — Progress Notes (Signed)
Patient ID: Peter Sloan, male   DOB: November 27, 1960, 52 y.o.   MRN: [id number] Provider:  Rexene Edison. Mariea Clonts, D.O., C.M.D. Location:  [facility]  PCP: Placido Sou, MD  Code Status: full code   Allergies  Allergen Reactions  . Benadryl [Diphenhydramine Hcl]   . Diphenhydramine Hcl     Heart palpitations    Chief Complaint  Patient presents with  . Hospitalization Follow-up    HPI: 52 y.o. 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    ROS: Review of Systems  Constitutional: Positive for weight loss and malaise/fatigue. Negative for fever and diaphoresis.  HENT: Negative for congestion.   Eyes: Negative for blurred vision.  Respiratory: Negative for shortness of breath.   Cardiovascular: Negative for chest pain and leg swelling.  Gastrointestinal: Negative for abdominal pain, constipation, blood in stool and melena.  Genitourinary: Negative for flank pain.  Musculoskeletal: Negative for falls and myalgias.  Skin: Negative for rash.  Neurological: Positive for weakness. Negative for dizziness.  Endo/Heme/Allergies: Bruises/bleeds easily.  Psychiatric/Behavioral: Positive for depression. Negative for memory loss.     Past Medical History  Diagnosis Date  . Congenital solitary kidney   . Chronic renal insufficiency     2/2 ischemic ATN, GFR 15-17 ml/min, baseline Cr: 4-4.5, never had a renal biopsy, s/p AV fistula placement in 2007, f/b nephrologist, Dr. Dorothe Pea, [place]  . Nonischemic cardiomyopathy     EF 20%(9/06) with most recent EF 50%(3/08), valves normal per 2D echo (3/08), normal doppler and color flow study(3/08) s/p endocmyocardial  biopsy: mild myocyte hypertrophy no myocyte damage or inflammation  . Seizure disorder     complex- partial, h/o sz disorder as a child  . Gout     h/o: many in Rt big toe and Rt Knee, never had aspirartion diagnosis  .  Atrial fibrillation   . Thyroid disorder   . High cholesterol   . Seizures   . Congestive heart failure   . Hyperparathyroidism   . End stage renal disease on dialysis   . 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 Severe protein-calorie malnutrition   . Physical debility    Past Surgical History  Procedure Laterality Date  . Kidney surgery    . Tonsillectomy    . Hernia repair    . Kidney transplant  08/14/12  . Nephrectomy transplanted organ  10/14/12   Social History:   reports that he has never smoked. He has never used smokeless tobacco. He reports that he does not drink alcohol or use illicit drugs.  Family History  Problem Relation Age of Onset  . Osteoarthritis Mother   . Hypertension Father   . Muscular dystrophy Brother   . Heart disease Child     one 80 y/o son has MVP    Medications: Patient's Medications  New Prescriptions   No medications on file  Previous Medications   AMIODARONE (PACERONE) 200 MG TABLET    Take 200 mg by mouth daily.   ASPIRIN 81 MG TABLET    Take 81 mg by mouth every morning.    OMEPRAZOLE (PRILOSEC) 20 MG CAPSULE    Take 1 capsule by mouth daily.   TORSEMIDE (DEMADEX) 20 MG TABLET    Take 40 mg by mouth daily.  Modified Medications   Modified Medication Previous Medication   DIVALPROEX (DEPAKOTE ER) 500 MG 24 HR TABLET  divalproex (DEPAKOTE ER) 500 MG 24 hr tablet      Take 250 mg by mouth 2 (two) times daily.    Take 1 tablet (500 mg total) by mouth 2 (two) times daily.   LAMOTRIGINE (LAMICTAL) 25 MG TABLET lamoTRIgine (LAMICTAL) 25 MG tablet      Take 50 mg by mouth 2 (two) times daily.    Take 1 tablet (25 mg total) by mouth 2 (two) times daily.   LEVETIRACETAM (KEPPRA) 500 MG TABLET levETIRAcetam (KEPPRA) 500 MG tablet      Take 250 mg by mouth 2 (two) times daily.    Take 1 tablet (500 mg total) by mouth 2 (two) times daily.  Discontinued Medications   ALLOPURINOL (ZYLOPRIM) 300 MG TABLET    Take  300 mg by mouth every evening.   B COMPLEX-VITAMIN C-FOLIC ACID (NEPHRO-VITE) 0.8 MG TABS    Take 0.8 mg by mouth daily.   DILTIAZEM (CARDIZEM) 90 MG TABLET    Take 90 mg by mouth 2 (two) times daily.   FUROSEMIDE (LASIX) 20 MG TABLET    Take 1 tablet by mouth 2 (two) times daily.   GLIMEPIRIDE (AMARYL) 1 MG TABLET    as needed.   MAGNESIUM OXIDE (MAG-OX) 400 MG TABLET    Take 800 mg by mouth 2 (two) times daily.   MYCOPHENOLATE (CELLCEPT) 250 MG CAPSULE    Take 1,000 mg by mouth 2 (two) times daily.   OXYCODONE (OXY IR/ROXICODONE) 5 MG IMMEDIATE RELEASE TABLET    Take 1-2 tablets by mouth every 4 (four) hours as needed.   PREDNISONE (DELTASONE) 5 MG TABLET    Take 1 tablet by mouth daily.   SENNA-DOCUSATE (SENOKOT-S) 8.6-50 MG PER TABLET    Take 1 tablet by mouth daily.   SULFAMETHOXAZOLE-TRIMETHOPRIM (BACTRIM DS) 800-160 MG PER TABLET    Take 1 tablet by mouth daily.   TACROLIMUS PO    Take 1 mg by mouth every 12 (twelve) hours.   TAMSULOSIN (FLOMAX) 0.4 MG CAPS    Take 1 capsule by mouth daily.   VALGANCICLOVIR (VALCYTE) 450 MG TABLET    Take 450 mg by mouth daily.     Physical Exam: Filed Vitals:   11/18/12 1057  BP: 134/82  Pulse: 65  Temp: 98.2 F (36.8 C)  Resp: 14  Physical Exam  Constitutional: He is oriented to person, place, and time.  Chronically ill-appearing petite male  HENT:  Head: Normocephalic and atraumatic.  Right Ear: External ear normal.  Left Ear: External ear normal.  Nose: Nose normal.  Mouth/Throat: Oropharynx is clear and moist. No oropharyngeal exudate.  Eyes: Conjunctivae and EOM are normal. Pupils are equal, round, and reactive to light.  Neck: Normal range of motion. Neck supple. No JVD present. No thyromegaly present.  Cardiovascular: Normal rate, regular rhythm and intact distal pulses.   Pulmonary/Chest: Effort normal and breath sounds normal. He has no rales.  Abdominal: Soft. Bowel sounds are normal. He exhibits no distension and no mass.  There is tenderness.  Musculoskeletal: Normal range of motion.  Neurological: He is alert and oriented to person, place, and time. No cranial nerve deficit.  Psychiatric:  Flat affect, wants to be left alone     Labs reviewed: Basic Metabolic Panel:  Recent Labs  01/12/12 1710 01/13/12 0950 07/05/12 2051  NA 139 141 137  K 3.1* 4.0 3.6  CL 99 102 97  CO2 27 29  --   GLUCOSE 111* 87 81  BUN 10  18 10  CREATININE 3.16* 4.71* 3.20*  CALCIUM 9.3 9.3  --   MG 2.0  --   --    Liver Function Tests:  Recent Labs  01/12/12 1710 09/16/12 1453  AST 22 27  ALT 11 45*  ALKPHOS 84 68  BILITOT 0.2* 0.3  PROT 6.6 4.7*  ALBUMIN 3.5  --    CBC:  Recent Labs  01/12/12 1710 07/05/12 2051  WBC 7.2  --   NEUTROABS 4.5  --   HGB 13.1 13.9  HCT 39.0 41.0  MCV 99.0  --   PLT 217  --    Cardiac Enzymes:  Recent Labs  07/05/12 2022  TROPONINI <0.30   CBG:  Recent Labs  07/05/12 1915  GLUCAP 66*   Last labs at [facility] 11/17/12:   WBC 9.8, h/h 8.4/24.8, plts 417, Na 140, K 4, BUN 25, cr 6, alb 2.3, transaminases nl, depakene 16  Imaging and Procedures: 10/13/12:  Renal doppler:  Obstruction-->IR for balloon angioplasty--unsuccessful 10/14/12:  No blood flow to kidney-->to OR for nephrectomy 10/15/12:  Hematoma at nephrectomy site--> evacuation of hematoma in OR  Assessment/Plan 1. Renal vein thrombosis of donor kidney -f/u with [facility] surgery  2. End stage renal disease on dialysis -receiving HD via left AV fistula  3. 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 Severe protein-calorie malnutrition -weight at hospital admission was 163lbs 10/29/12, but 142.2 on 11/17/12 -on low dose mirtazapine and nepro supplement with high protein diet -receives albumin at HD  5. Atrial fibrillation -on amiodarone -will monitor thyroid, liver, lung functions  6. Physical debility --working with PT on stair climbing  --could  work with them for 10 mins before coming here --cont PT, OT here with plans for discharge home when safe  7. Postoperative anemia due to acute blood loss s/p nephrectomy -stabilized at hospital d/c -also gets darpopoietin at HD for anemia of chronic kidney disease weekly on mondays  8. Seizure disorder -depakote dose was reduced due to thrombocytopenia, seizures have been well-controlled -needs f/u with [facility]  9. Hyperlipidemia -not on any medication at present for this  10. GOUT -not on any regular gout medication at this time    Labs/tests ordered:    Cbc, cmp next draw

## 2012-11-28 ENCOUNTER — Non-Acute Institutional Stay (SKILLED_NURSING_FACILITY): Payer: Medicare Other | Admitting: Internal Medicine

## 2012-11-28 DIAGNOSIS — G40909 Epilepsy, unspecified, not intractable, without status epilepticus: Secondary | ICD-10-CM

## 2012-11-28 DIAGNOSIS — N186 End stage renal disease: Secondary | ICD-10-CM

## 2012-11-28 DIAGNOSIS — I4891 Unspecified atrial fibrillation: Secondary | ICD-10-CM

## 2012-11-28 DIAGNOSIS — E43 Unspecified severe protein-calorie malnutrition: Secondary | ICD-10-CM

## 2012-11-28 DIAGNOSIS — D62 Acute posthemorrhagic anemia: Secondary | ICD-10-CM

## 2012-11-28 DIAGNOSIS — R5381 Other malaise: Secondary | ICD-10-CM

## 2012-11-28 NOTE — Progress Notes (Signed)
Patient ID: Peter Sloan, male   DOB: May 31, 1960, 52 y.o.   MRN: [id number]  Armandina Gemma living [place]  Chief Complaint  Patient presents with  . Discharge Note   HPI 52 y/o male patient with hx of renal transplant and renal vein thrombosis is s/p nephrectomy and had protein calorie malnutrition and severe debility and was sent to SNF for STR.  He has been working with therapy well. He has made improvement and is stable to go home with home health services.  He was seen in his room. He has no concerns. He goes for dialysis 3 days a week  ROS No fever or chills No chest pain No nausea or vomiting No headache or blurry vision No falls reported Using a rollator walker  Current Outpatient Prescriptions on File Prior to Visit  Medication Sig Dispense Refill  . amiodarone (PACERONE) 200 MG tablet Take 200 mg by mouth daily.      Marland Kitchen aspirin 81 MG tablet Take 81 mg by mouth every morning.       . divalproex (DEPAKOTE ER) 500 MG 24 hr tablet Take 250 mg by mouth 2 (two) times daily.      Marland Kitchen lamoTRIgine (LAMICTAL) 25 MG tablet Take 50 mg by mouth 2 (two) times daily.      Marland Kitchen levETIRAcetam (KEPPRA) 500 MG tablet Take 250 mg by mouth 2 (two) times daily.      Marland Kitchen omeprazole (PRILOSEC) 20 MG capsule Take 1 capsule by mouth daily.      Marland Kitchen torsemide (DEMADEX) 20 MG tablet Take 40 mg by mouth daily.       No current facility-administered medications on file prior to visit.   Reviewed medications  Past Medical History  Diagnosis Date  . Congenital solitary kidney   . Chronic renal insufficiency     2/2 ischemic ATN, GFR 15-17 ml/min, baseline Cr: 4-4.5, never had a renal biopsy, s/p AV fistula placement in 2007, f/b nephrologist, Dr. Dorothe Pea, [place]  . Nonischemic cardiomyopathy     EF 20%(9/06) with most recent EF 50%(3/08), valves normal per 2D echo (3/08), normal doppler and color flow study(3/08) s/p endocmyocardial  biopsy: mild myocyte hypertrophy no myocyte damage or inflammation  . Seizure  disorder     complex- partial, h/o sz disorder as a child  . Gout     h/o: many in Rt big toe and Rt Knee, never had aspirartion diagnosis  . Atrial fibrillation   . Thyroid disorder   . High cholesterol   . Seizures   . Congestive heart failure   . Hyperparathyroidism   . End stage renal disease on dialysis   . 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 Severe protein-calorie malnutrition   . Physical debility    Past Surgical History  Procedure Laterality Date  . Kidney surgery    . Tonsillectomy    . Hernia repair    . Kidney transplant  08/14/12  . Nephrectomy transplanted organ  10/14/12    Family History  Problem Relation Age of Onset  . Osteoarthritis Mother   . Hypertension Father   . Muscular dystrophy Brother   . Heart disease Child     one 74 y/o son has MVP   Allergies  Allergen Reactions  . Benadryl [Diphenhydramine Hcl]   . Diphenhydramine Hcl     Heart palpitations    BP 109/71  Pulse 64  Temp(Src) 97.8 F (36.6 C)  Resp 16  SpO2  96%  gen- adult male in NAD, chronically ill appearing HEENT- no pallor, no icterus, no LAD, MMM cvs- normal s1,s2, rrr respi- CTAB Ext- Left arm AV fistula, able to move all 4 extremities, using wheelchair and rollator walker here Skin- chronic skin changes in LE Neuro- aaox 3, no focal deficit   Imaging and Procedures: 10/13/12:  Renal doppler:  Obstruction-->IR for balloon angioplasty--unsuccessful 10/14/12:  No blood flow to kidney-->to OR for nephrectomy 10/15/12:  Hematoma at nephrectomy site--> evacuation of hematoma in OR  Assessment/Plan  He is stable to be discharged home with home health services for PT, OT and speech therapy services. He will get [facility] from [facility]. He has follow up with PCP. He will get a script for rollator with seat. 30 days script of his medications will be provided  End stage renal disease on dialysis Continue hemodialysis via left AV  fistula. Continue nephrovite  Atrial fibrillation Continue amiodarone and aspirin. 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 Continue mirtazapine to help with appetite stimulation. Continue albumin supplement at HD centre  Renal vein thrombosis of donor kidney f/u with [facility] surgery  Postoperative anemia due to acute blood loss s/p nephrectomy Continue darpopoietin at HD for anemia of chronic kidney disease weekly on mondays. Continue iron supplement. follow cbc  Seizure disorder Remains seizure free. Continue depakote with lamictal and keppra. Follow with PCP  He has been on sq heaprin for dvt prophylaxis and is mobilizing well in facility. Will discontinue his heparin on discharge

## 2012-12-04 DIAGNOSIS — D62 Acute posthemorrhagic anemia: Secondary | ICD-10-CM

## 2012-12-04 DIAGNOSIS — M6281 Muscle weakness (generalized): Secondary | ICD-10-CM

## 2012-12-04 DIAGNOSIS — Z48816 Encounter for surgical aftercare following surgery on the genitourinary system: Secondary | ICD-10-CM

## 2012-12-04 DIAGNOSIS — R269 Unspecified abnormalities of gait and mobility: Secondary | ICD-10-CM

## 2012-12-15 ENCOUNTER — Ambulatory Visit: Payer: Self-pay | Admitting: Diagnostic Neuroimaging

## 2012-12-19 ENCOUNTER — Ambulatory Visit: Payer: Medicare Other | Admitting: Diagnostic Neuroimaging

## 2013-01-05 ENCOUNTER — Ambulatory Visit (INDEPENDENT_AMBULATORY_CARE_PROVIDER_SITE_OTHER): Payer: Medicare Other | Admitting: Diagnostic Neuroimaging

## 2013-01-05 ENCOUNTER — Encounter: Payer: Self-pay | Admitting: Diagnostic Neuroimaging

## 2013-01-05 VITALS — BP 134/87 | HR 106 | Temp 97.0°F | Ht 65.25 in | Wt 148.0 lb

## 2013-01-05 DIAGNOSIS — G40909 Epilepsy, unspecified, not intractable, without status epilepticus: Secondary | ICD-10-CM

## 2013-01-05 MED ORDER — LEVETIRACETAM 500 MG PO TABS: 250.0000 mg | ORAL_TABLET | Freq: Two times a day (BID) | ORAL | Status: DC

## 2013-01-05 MED ORDER — DIVALPROEX SODIUM ER 250 MG PO TB24: 250.0000 mg | ORAL_TABLET | Freq: Two times a day (BID) | ORAL | Status: DC

## 2013-01-05 MED ORDER — LAMOTRIGINE 25 MG PO TABS: 25.0000 mg | ORAL_TABLET | Freq: Two times a day (BID) | ORAL | Status: DC

## 2013-01-05 NOTE — Patient Instructions (Signed)
Continue current meds 

## 2013-01-05 NOTE — Progress Notes (Signed)
[facility]  PATIENT: Peter Sloan DOB: December 15, 1960   HISTORY FROM: patient and mother REASON FOR VISIT: follow up   HISTORICAL  CHIEF COMPLAINT:  Chief Complaint  Patient presents with  . Follow-up    per EMR    HISTORY OF PRESENT ILLNESS:   UPDATE 01/05/13: Since last visit, no further seizures. Kidney transplant failed unfortunately. Tremors are better. On depakote ER 250mg  BID, lamotrigine 25mg  BID, and levetiracetam 250mg  BID.  UPDATE 09/16/12: Patient had kidney transplant on Aug 14, 2012.  Hospital admission at [facility] was 10 days.  Ureter stent was placed during transplant surgery which had to be replaced and prolonged the admission.  Discharged with foley catheter, he could not void in 6 hours so foley was replaced.  He currently has foley.  Kidney is functioning, no more HD needed.  He is on week 3 of 6 weeks of in-home therapy.  Ambulates with walker but needed wheelchair for long distances. Has had 9 falls in 12 days.  Appears very deconditioned. Tremors are worse since surgery, affecting writing, feeding self.  Currently on VPA 1000mg  BID and LTG 25mg  BID  UPDATE 07/28/12: Since last visit, had breakthrough sz after dialysis session (told me that double volume was removed inadvertantly). Since then I re-adjusted his meds to VPA 1000mg  BID and LTG 50mg  BID. Now in review of records, I think he may still be on 25mg  BID, but he is not sure.   UPDATE 05/28/12: Since last visit, no sz. Tolerating meds. No new events.  UPDATE 02/20/12: Since last visit, now on dialysis. Also, had breakthrough sz on 01/11/12 (admitted x 1 day). Now on incr VPA 750mg  BID and continues on LTG 25mg  BID. No triggering factors for breakthrough sz.   UPDATE 06/26/11: Last convulsive seizure was in February 2012. Most recent abnormal spell was 05/05/2011, following a stress test. He had been laying down at home, felt a abnormal sensation in the left occipital region with a pop sensation,  followed by a rush sensation in his face down to his stomach with a sour stomach sensation. He thinks this may have been a petit mall seizure. He was fully aware and conscious during the episode. Episode was similar but somewhat different than his prior episodes. In response to this event he was started on low dose Lamictal 25 mg twice a day. Since that time he has had no further events.   PRIOR HPI: 62 -year-old, right handed, Caucasian male who returns today for followup for history of longstanding seizure disorder. He has a single kidney with renal dysfunction. 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 He has never required dialysis however he is on the transplant list at [facility]. His last major seizure was 05/15/10 and seen in the ER. No loss of B/B, did not bite his tongue. He has had a total of 2 seizures over the last few years.  He has had problems with thrombocytopenia in the past . His Depakote dose was reduced and he was seen by a hematologist. The platelet count has returned to normal. He has history of HTN, atrial fib, hypercholesterolemia , and renal failure. Recent labs reviewed from Dr. Deterding's office. Lamictal added at last visit, low dose. No further seizure activity.See ROS.   REVIEW OF SYSTEMS: Full 14 system review of systems performed and notable only for: Dizziness change in appetite racing thoughts allergies running nose.   ALLERGIES: Allergies  Allergen Reactions  . Benadryl [Diphenhydramine Hcl]   .  Diphenhydramine Hcl     Heart palpitations    HOME MEDICATIONS: PAST MEDICAL HISTORY: Past Medical History  Diagnosis Date  . Congenital solitary kidney   . Chronic renal insufficiency     2/2 ischemic ATN, GFR 15-17 ml/min, baseline Cr: 4-4.5, never had a renal biopsy, s/p AV fistula placement in 2007, f/b nephrologist, Dr. Dorothe Pea, [place]  . Nonischemic cardiomyopathy     EF 20%(9/06) with most recent EF 50%(3/08), valves normal per 2D echo  (3/08), normal doppler and color flow study(3/08) s/p endocmyocardial  biopsy: mild myocyte hypertrophy no myocyte damage or inflammation  . Seizure disorder     complex- partial, h/o sz disorder as a child  . Gout     h/o: many in Rt big toe and Rt Knee, never had aspirartion diagnosis  . Atrial fibrillation   . Thyroid disorder   . High cholesterol   . Seizures   . Congestive heart failure   . Hyperparathyroidism   . End stage renal disease on dialysis   . 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 Severe protein-calorie malnutrition   . Physical debility     PAST SURGICAL HISTORY: Past Surgical History  Procedure Laterality Date  . Kidney surgery    . Tonsillectomy    . Hernia repair    . Kidney transplant  08/14/12  . Nephrectomy transplanted organ  10/14/12    FAMILY HISTORY: Family History  Problem Relation Age of Onset  . Osteoarthritis Mother   . Hypertension Father   . Muscular dystrophy Brother   . Heart disease Child     one 32 y/o son has MVP    SOCIAL HISTORY: History   Social History  . Marital Status: Married    Spouse Name: Lattie Haw    Number of Children: 1  . Years of Education: HS   Occupational History  . Currently unemployed     used to [employer] as Scientist, clinical (histocompatibility and immunogenetics) in office in [place]  . Smoking status: Never Smoker   . Smokeless tobacco: Never Used  . Alcohol Use: No     Comment: social drinker  . Drug Use: No  . Sexual Activity: Yes    Birth Control/ Protection: None   Other Topics Concern  . Not on file   Social History Narrative   Moved from [place] on 08/12/06.   Lives with parents in [place].   Has been married for 17yrs.                  PHYSICAL EXAM  Filed Vitals:   01/05/13 1116  BP: 134/87  Pulse: 106  Temp: 97 F (36.1 C)  TempSrc: Oral  Height: 5' 5.25" (1.657 m)  Weight: 148 lb (67.132 kg)   Body mass index is 24.45 kg/(m^2).  GENERAL EXAM: Patient is in no  distress  CARDIOVASCULAR: Regular rate and rhythm, no murmurs, no carotid bruits  NEUROLOGIC: MENTAL STATUS: awake, alert, language fluent, comprehension intact, naming intact CRANIAL NERVE: pupils equal and reactive to light, visual fields full to confrontation, extraocular muscles intact, no nystagmus, facial sensation and strength symmetric, uvula midline, shoulder shrug symmetric, tongue midline. MOTOR: normal bulk and tone, full strength in the BUE, BLE; MINIMAL POSTURAL TREMOR OF BUE. SENSORY: normal and symmetric to light touch COORDINATION: finger-nose-finger, fine finger movements, heel-shin normal REFLEXES: deep tendon reflexes present and symmetric GAIT/STATION: narrow based gait;    DIAGNOSTIC DATA (LABS, IMAGING, TESTING) -  I reviewed patient records, labs, notes, testing and imaging myself where available.   ASSESSMENT AND PLAN  52 y.o. 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   PLAN: 1. CONTINUE current anti seizure medications (seizures and side effects appear stable)  Return in about 4 months (around 05/08/2013) for with Charlott Holler or Ory Elting.   Penni Bombard, MD [id number], 0000000 PM Certified in Neurology, Neurophysiology and Neuroimaging  [facility] [address] [place], [place] [address] [phone]

## 2013-01-29 ENCOUNTER — Other Ambulatory Visit: Payer: Self-pay

## 2013-03-12 ENCOUNTER — Encounter (HOSPITAL_COMMUNITY): Payer: Self-pay | Admitting: Emergency Medicine

## 2013-03-12 DIAGNOSIS — M109 Gout, unspecified: Secondary | ICD-10-CM | POA: Insufficient documentation

## 2013-03-12 DIAGNOSIS — Y841 Kidney dialysis as the cause of abnormal reaction of the patient, or of later complication, without mention of misadventure at the time of the procedure: Secondary | ICD-10-CM | POA: Insufficient documentation

## 2013-03-12 DIAGNOSIS — Z9889 Other specified postprocedural states: Secondary | ICD-10-CM | POA: Insufficient documentation

## 2013-03-12 DIAGNOSIS — I4891 Unspecified atrial fibrillation: Secondary | ICD-10-CM | POA: Insufficient documentation

## 2013-03-12 DIAGNOSIS — Z940 Kidney transplant status: Secondary | ICD-10-CM | POA: Insufficient documentation

## 2013-03-12 DIAGNOSIS — Z79899 Other long term (current) drug therapy: Secondary | ICD-10-CM | POA: Insufficient documentation

## 2013-03-12 DIAGNOSIS — T82598A Other mechanical complication of other cardiac and vascular devices and implants, initial encounter: Secondary | ICD-10-CM | POA: Insufficient documentation

## 2013-03-12 DIAGNOSIS — Z87718 Personal history of other specified (corrected) congenital malformations of genitourinary system: Secondary | ICD-10-CM | POA: Insufficient documentation

## 2013-03-12 DIAGNOSIS — G40909 Epilepsy, unspecified, not intractable, without status epilepticus: Secondary | ICD-10-CM | POA: Insufficient documentation

## 2013-03-12 DIAGNOSIS — Z8639 Personal history of other endocrine, nutritional and metabolic disease: Secondary | ICD-10-CM | POA: Insufficient documentation

## 2013-03-12 DIAGNOSIS — N186 End stage renal disease: Secondary | ICD-10-CM | POA: Insufficient documentation

## 2013-03-12 DIAGNOSIS — I509 Heart failure, unspecified: Secondary | ICD-10-CM | POA: Insufficient documentation

## 2013-03-12 DIAGNOSIS — Z7982 Long term (current) use of aspirin: Secondary | ICD-10-CM | POA: Insufficient documentation

## 2013-03-12 DIAGNOSIS — I150 Renovascular hypertension: Secondary | ICD-10-CM | POA: Insufficient documentation

## 2013-03-12 DIAGNOSIS — Z86718 Personal history of other venous thrombosis and embolism: Secondary | ICD-10-CM | POA: Insufficient documentation

## 2013-03-12 DIAGNOSIS — E213 Hyperparathyroidism, unspecified: Secondary | ICD-10-CM | POA: Insufficient documentation

## 2013-03-12 NOTE — ED Notes (Signed)
Pt. reports bleeding at AV graft at left forearm this evening after hemodialysis . Pressure dressing applied at triage by NT .

## 2013-03-13 ENCOUNTER — Emergency Department (HOSPITAL_COMMUNITY)
Admission: EM | Admit: 2013-03-13 | Discharge: 2013-03-13 | Disposition: A | Discharge status: Home / Self Care | Payer: Medicare Other | Attending: Emergency Medicine | Admitting: Emergency Medicine

## 2013-03-13 DIAGNOSIS — T829XXA Unspecified complication of cardiac and vascular prosthetic device, implant and graft, initial encounter: Secondary | ICD-10-CM

## 2013-03-13 NOTE — ED Provider Notes (Signed)
CSN: [id number]     Arrival date & time 03/12/13  2058 History   First MD Initiated Contact with Patient 03/13/13 0038     Chief Complaint  Patient presents with  . Vascular Access Problem   (Consider location/radiation/quality/duration/timing/severity/associated sxs/prior Treatment) HPI Left forearm AV fistula bleeding started a few hours after dialysis when he took off his dialysis dressing; bleeding controlled with local pressure upon arrival to ED; no pain/red/pus/distal weak/numb/color change to hand.    Past Medical History  Diagnosis Date  . Congenital solitary kidney   . Chronic renal insufficiency     2/2 ischemic ATN, GFR 15-17 ml/min, baseline Cr: 4-4.5, never had a renal biopsy, s/p AV fistula placement in 2007, f/b nephrologist, Dr. Dorothe Pea, [place]  . Nonischemic cardiomyopathy     EF 20%(9/06) with most recent EF 50%(3/08), valves normal per 2D echo (3/08), normal doppler and color flow study(3/08) s/p endocmyocardial  biopsy: mild myocyte hypertrophy no myocyte damage or inflammation  . Seizure disorder     complex- partial, h/o sz disorder as a child  . Gout     h/o: many in Rt big toe and Rt Knee, never had aspirartion diagnosis  . Atrial fibrillation   . Thyroid disorder   . High cholesterol   . Seizures   . Congestive heart failure   . Hyperparathyroidism   . End stage renal disease on dialysis   . 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 Severe protein-calorie malnutrition   . Physical debility    Past Surgical History  Procedure Laterality Date  . Kidney surgery    . Tonsillectomy    . Hernia repair    . Kidney transplant  08/14/12  . Nephrectomy transplanted organ  10/14/12   Family History  Problem Relation Age of Onset  . Osteoarthritis Mother   . Hypertension Father   . Muscular dystrophy Brother   . Heart disease Child     one 74 y/o son has MVP   History  Substance Use Topics  . Smoking status:  Never Smoker   . Smokeless tobacco: Never Used  . Alcohol Use: No     Comment: social drinker    Review of Systems 10 Systems reviewed and are negative for acute change except as noted in the HPI. Allergies  Diphenhydramine hcl  Home Medications   Current Outpatient Rx  Name  Route  Sig  Dispense  Refill  . allopurinol (ZYLOPRIM) 300 MG tablet   Oral   Take 300 mg by mouth daily.         Marland Kitchen amiodarone (PACERONE) 200 MG tablet   Oral   Take 200 mg by mouth daily.         Marland Kitchen aspirin 81 MG tablet   Oral   Take 81 mg by mouth every morning.          Marland Kitchen b complex-vitamin c-folic acid (NEPHRO-VITE) 0.8 MG TABS tablet   Oral   Take 0.8 mg by mouth daily.          . cinacalcet (SENSIPAR) 30 MG tablet   Oral   Take 30 mg by mouth daily.          . divalproex (DEPAKOTE ER) 250 MG 24 hr tablet   Oral   Take 1 tablet (250 mg total) by mouth 2 (two) times daily.   180 tablet   4   . lamoTRIgine (LAMICTAL) 25 MG tablet   Oral  Take 1 tablet (25 mg total) by mouth 2 (two) times daily.   180 tablet   4   . levETIRAcetam (KEPPRA) 500 MG tablet   Oral   Take 0.5 tablets (250 mg total) by mouth 2 (two) times daily.   180 tablet   4   . omeprazole (PRILOSEC) 20 MG capsule   Oral   Take 1 capsule by mouth daily.          BP 116/83  Pulse 93  Temp(Src) 98 F (36.7 C) (Oral)  Resp 20  Wt 150 lb (68.04 kg)  SpO2 93% Physical Exam  Nursing note and vitals reviewed. Constitutional:  Awake, alert, nontoxic appearance.  HENT:  Head: Atraumatic.  Eyes: Right eye exhibits no discharge. Left eye exhibits no discharge.  Neck: Neck supple.  Cardiovascular: Normal rate and regular rhythm.   No murmur heard. Pulmonary/Chest: Effort normal and breath sounds normal. No respiratory distress. He has no wheezes. He has no rales. He exhibits no tenderness.  Abdominal: Soft. There is no tenderness. There is no rebound.  Musculoskeletal: He exhibits no tenderness.   Baseline ROM, no obvious new focal weakness.Left hand NVI CR<2secs normal LT and intact motor distribution median/ulnar/radial nerve function; NT left forearm good thrill fistula; no bleeding  Neurological: He is alert.  Mental status and motor strength appears baseline for patient and situation.  Skin: No rash noted.  Psychiatric: He has a normal mood and affect.    ED Course  Procedures (including critical care time) Patient / Family / Caregiver informed of clinical course, understand medical decision-making process, and agree with plan. Labs Review Labs Reviewed - No data to display Imaging Review No results found.  EKG Interpretation   None       MDM   1. Complication of arteriovenous dialysis fistula, initial encounter    I doubt any other EMC precluding discharge at this time including, but not necessarily limited to the following:uncontrolled bleeding, infection, thrombosis of fistula.    Babette Relic, MD 03/13/13 2139

## 2013-03-13 NOTE — ED Notes (Signed)
Stevie Kern, MD at bedside.

## 2013-03-13 NOTE — ED Notes (Signed)
Fistula access site is no longer bleeding.

## 2013-04-06 ENCOUNTER — Encounter: Payer: Self-pay | Admitting: Nurse Practitioner

## 2013-05-18 ENCOUNTER — Ambulatory Visit: Payer: Medicare Other | Admitting: Nurse Practitioner

## 2013-06-05 ENCOUNTER — Encounter: Payer: Self-pay | Admitting: Nurse Practitioner

## 2013-06-05 ENCOUNTER — Ambulatory Visit (INDEPENDENT_AMBULATORY_CARE_PROVIDER_SITE_OTHER): Payer: Medicare Other | Admitting: Nurse Practitioner

## 2013-06-05 VITALS — BP 126/85 | HR 93 | Ht 65.0 in | Wt 155.0 lb

## 2013-06-05 DIAGNOSIS — G40909 Epilepsy, unspecified, not intractable, without status epilepticus: Secondary | ICD-10-CM

## 2013-06-05 MED ORDER — LAMOTRIGINE 25 MG PO TABS: 25.0000 mg | ORAL_TABLET | Freq: Two times a day (BID) | ORAL | Status: DC

## 2013-06-05 MED ORDER — DIVALPROEX SODIUM ER 250 MG PO TB24: 250.0000 mg | ORAL_TABLET | Freq: Every day | ORAL | Status: DC

## 2013-06-05 MED ORDER — LEVETIRACETAM 500 MG PO TABS: 500.0000 mg | ORAL_TABLET | Freq: Two times a day (BID) | ORAL | Status: DC

## 2013-06-05 NOTE — Patient Instructions (Signed)
Decrease Depakote ER to 1 tablet at bedtime only.  Increase Levetiracetam to 1 whole tablet, 500 mg  Every 12 hours.  Continue Lamictal at current dose.  Follow up in 2-3 months, we will continue to decrease Depakote at that time.

## 2013-06-05 NOTE — Progress Notes (Signed)
PATIENT: Peter Sloan DOB: December 15, 1960  REASON FOR VISIT: follow up for seizure disorder. HISTORY FROM: patient  HISTORY OF PRESENT ILLNESS: UPDATE 06/05/13 (LL): Since last visit, no further seizures. Tremors are gone. On depakote ER 250mg  BID, lamotrigine 25mg  BID, and levetiracetam 250mg  BID. Exercising daily at the [facility], feels good.  UPDATE 01/05/13 (VP): Since last visit, no further seizures. Kidney transplant failed unfortunately. Tremors are better. On depakote ER 250mg  BID, lamotrigine 25mg  BID, and levetiracetam 250mg  BID.  UPDATE 09/16/12: Patient had kidney transplant on Aug 14, 2012. Hospital admission at [facility] was 10 days. Ureter stent was placed during transplant surgery which had to be replaced and prolonged the admission. Discharged with foley catheter, he could not void in 6 hours so foley was replaced. He currently has foley. Kidney is functioning, no more HD needed. He is on week 3 of 6 weeks of in-home therapy. Ambulates with walker but needed wheelchair for long distances. Has had 9 falls in 12 days. Appears very deconditioned. Tremors are worse since surgery, affecting writing, feeding self. Currently on VPA 1000mg  BID and LTG 25mg  BID  UPDATE 07/28/12: Since last visit, had breakthrough sz after dialysis session (told me that double volume was removed inadvertantly). Since then I re-adjusted his meds to VPA 1000mg  BID and LTG 50mg  BID. Now in review of records, I think he may still be on 25mg  BID, but he is not sure.  UPDATE 05/28/12: Since last visit, no sz. Tolerating meds. No new events.  UPDATE 02/20/12: Since last visit, now on dialysis. Also, had breakthrough sz on 01/11/12 (admitted x 1 day). Now on incr VPA 750mg  BID and continues on LTG 25mg  BID. No triggering factors for breakthrough sz.  UPDATE 06/26/11: Last convulsive seizure was in February 2012. Most recent abnormal spell was 05/05/2011, following a stress test. He had been laying down at home, felt a abnormal sensation  in the left occipital region with a pop sensation, followed by a rush sensation in his face down to his stomach with a sour stomach sensation. He thinks this may have been a petit mall seizure. He was fully aware and conscious during the episode. Episode was similar but somewhat different than his prior episodes. In response to this event he was started on low dose Lamictal 25 mg twice a day. Since that time he has had no further events.  PRIOR HPI: 53 -year-old, right handed, Caucasian male who returns today for followup for history of longstanding seizure disorder. He has a single kidney with renal dysfunction. 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 He has never required dialysis however he is on the transplant list at [facility]. His last major seizure was 05/15/10 and seen in the ER. No loss of B/B, did not bite his tongue. He has had a total of 2 seizures over the last few years.  He has had problems with thrombocytopenia in the past . His Depakote dose was reduced and he was seen by a hematologist. The platelet count has returned to normal. He has history of HTN, atrial fib, hypercholesterolemia , and renal failure. Recent labs reviewed from Dr. Deterding's office. Lamictal added at last visit, low dose. No further seizure activity.See ROS.   REVIEW OF SYSTEMS: Full 14 system review of systems performed and notable only for: No Complaints.  ALLERGIES: Allergies  Allergen Reactions  . Diphenhydramine Hcl Palpitations    HOME MEDICATIONS: Outpatient Prescriptions Prior to Visit  Medication Sig Dispense Refill  .  allopurinol (ZYLOPRIM) 300 MG tablet Take 300 mg by mouth daily.      Marland Kitchen amiodarone (PACERONE) 200 MG tablet Take 200 mg by mouth daily.      Marland Kitchen aspirin 81 MG tablet Take 81 mg by mouth every morning.       Marland Kitchen b complex-vitamin c-folic acid (NEPHRO-VITE) 0.8 MG TABS tablet Take 0.8 mg by mouth daily.       . cinacalcet (SENSIPAR) 30 MG tablet Take 30 mg by mouth daily.         Marland Kitchen omeprazole (PRILOSEC) 20 MG capsule Take 1 capsule by mouth daily.      . divalproex (DEPAKOTE ER) 250 MG 24 hr tablet Take 1 tablet (250 mg total) by mouth 2 (two) times daily.  180 tablet  4  . lamoTRIgine (LAMICTAL) 25 MG tablet Take 1 tablet (25 mg total) by mouth 2 (two) times daily.  180 tablet  4  . levETIRAcetam (KEPPRA) 500 MG tablet Take 0.5 tablets (250 mg total) by mouth 2 (two) times daily.  180 tablet  4   No facility-administered medications prior to visit.     PHYSICAL EXAM  Filed Vitals:   06/05/13 1113  BP: 126/85  Pulse: 93  Height: 5\' 5"  (1.651 m)  Weight: 155 lb (70.308 kg)   Body mass index is 25.79 kg/(m^2).  Generalized: Well developed, in no acute distress  Head: normocephalic and atraumatic. Oropharynx benign  Neck: Supple, no carotid bruits  Cardiac: Regular rate rhythm, no murmur  Musculoskeletal: No deformity   NEUROLOGIC:  MENTAL STATUS: awake, alert, language fluent, comprehension intact, naming intact  CRANIAL NERVE: pupils equal and reactive to light, visual fields full to confrontation, extraocular muscles intact, no nystagmus, facial sensation and strength symmetric, uvula midline, shoulder shrug symmetric, tongue midline.  MOTOR: normal bulk and tone, full strength in the BUE, BLE; MINIMAL POSTURAL TREMOR OF BUE.  SENSORY: normal and symmetric to light touch  COORDINATION: finger-nose-finger, fine finger movements, heel-shin normal  REFLEXES: deep tendon reflexes present and symmetric  GAIT/STATION: narrow based gait  ASSESSMENT AND PLAN 53 y.o. 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   PLAN: Decrease Depakote ER to 1  tablet at bedtime only. Increase Levetiracetam to 1 whole tablet, 500 mg  Every 12 hours. Continue Lamictal at current dose.   Follow up in 2-3 months, we will continue to decrease Depakote at that time.  Meds ordered this encounter  Medications  . levETIRAcetam (KEPPRA) 500 MG tablet    Sig: Take 1 tablet (500 mg total) by mouth 2 (two) times daily.    Dispense:  180 tablet    Refill:  5    Do not fill now-please put on file. Patient just picked up.    Order Specific Question:  Supervising Provider    Answer:  Penni Bombard [[id number]]  . divalproex (DEPAKOTE ER) 250 MG 24 hr tablet    Sig: Take 1 tablet (250 mg total) by mouth daily. Do not fill now-please put on file. Patient just picked up.    Dispense:  90 tablet    Refill:  1    Order Specific Question:  Supervising Provider    Answer:  Andrey Spearman R [[id number]]  . lamoTRIgine (LAMICTAL) 25 MG tablet    Sig:  Take 1 tablet (25 mg total) by mouth 2 (two) times daily.    Dispense:  180 tablet    Refill:  5    Do not fill now-please put on file. Patient just picked up.    Order Specific Question:  Supervising Provider    Answer:  Penni Bombard [[id number]]   Return in about 3 months (around 09/05/2013).  Philmore Pali, MSN, NP-C 06/05/2013, 1:01 PM [facility] [address] [place], [place] [address] [phone]  Note: This document was prepared with digital dictation and possible smart phrase technology. Any transcriptional errors that result from this process are unintentional.

## 2013-06-05 NOTE — Progress Notes (Signed)
I reviewed note and agree with plan.   Penni Bombard, MD [id number], 0000000 PM Certified in Neurology, Neurophysiology and Neuroimaging  [facility] [address] [place], [place] [address] [phone]

## 2013-08-07 ENCOUNTER — Encounter: Payer: Self-pay | Admitting: Nurse Practitioner

## 2013-08-07 ENCOUNTER — Ambulatory Visit (INDEPENDENT_AMBULATORY_CARE_PROVIDER_SITE_OTHER): Payer: Medicare Other | Admitting: Nurse Practitioner

## 2013-08-07 VITALS — BP 139/92 | HR 89 | Wt 158.0 lb

## 2013-08-07 DIAGNOSIS — G40909 Epilepsy, unspecified, not intractable, without status epilepticus: Secondary | ICD-10-CM

## 2013-08-07 NOTE — Progress Notes (Signed)
PATIENT: Peter Sloan DOB: 05/28/1960  REASON FOR VISIT: follow up for seizure HISTORY FROM: patient  HISTORY OF PRESENT ILLNESS: UPDATE 08/07/13 (LL): Since last visit no further seizures, doing well. No noted tremors.  On depakote ER 250mg  daily, lamotrigine 25mg  BID, and levetiracetam 250mg  BID.  Since Depakote dose has been reduced his hair has returned. iPTH level rose from 126 to 927 since Dec 2014.  No new complaints.   UPDATE 06/05/13 (LL): Since last visit, no further seizures. Tremors are gone. On depakote ER 250mg  BID, lamotrigine 25mg  BID, and levetiracetam 250mg  BID. Exercising daily at the [facility], feels good.  UPDATE 01/05/13 (VP): Since last visit, no further seizures. Kidney transplant failed unfortunately. Tremors are better. On depakote ER 250mg  BID, lamotrigine 25mg  BID, and levetiracetam 250mg  BID.  UPDATE 09/16/12: Patient had kidney transplant on Aug 14, 2012. Hospital admission at [facility] was 10 days. Ureter stent was placed during transplant surgery which had to be replaced and prolonged the admission. Discharged with foley catheter, he could not void in 6 hours so foley was replaced. He currently has foley. Kidney is functioning, no more HD needed. He is on week 3 of 6 weeks of in-home therapy. Ambulates with walker but needed wheelchair for long distances. Has had 9 falls in 12 days. Appears very deconditioned. Tremors are worse since surgery, affecting writing, feeding self. Currently on VPA 1000mg  BID and LTG 25mg  BID  UPDATE 07/28/12: Since last visit, had breakthrough sz after dialysis session (told me that double volume was removed inadvertantly). Since then I re-adjusted his meds to VPA 1000mg  BID and LTG 50mg  BID. Now in review of records, I think he may still be on 25mg  BID, but he is not sure.  UPDATE 05/28/12: Since last visit, no sz. Tolerating meds. No new events.  UPDATE 02/20/12: Since last visit, now on dialysis. Also, had breakthrough sz on 01/11/12 (admitted x 1  day). Now on incr VPA 750mg  BID and continues on LTG 25mg  BID. No triggering factors for breakthrough sz.  UPDATE 06/26/11: Last convulsive seizure was in February 2012. Most recent abnormal spell was 05/05/2011, following a stress test. He had been laying down at home, felt a abnormal sensation in the left occipital region with a pop sensation, followed by a rush sensation in his face down to his stomach with a sour stomach sensation. He thinks this may have been a petit mall seizure. He was fully aware and conscious during the episode. Episode was similar but somewhat different than his prior episodes. In response to this event he was started on low dose Lamictal 25 mg twice a day. Since that time he has had no further events.  PRIOR HPI: 53 -year-old, right handed, Caucasian male who returns today for followup for history of longstanding seizure disorder. He has a single kidney with renal dysfunction. 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 He has never required dialysis however he is on the transplant list at [facility]. His last major seizure was 05/15/10 and seen in the ER. No loss of B/B, did not bite his tongue. He has had a total of 2 seizures over the last few years.  He has had problems with thrombocytopenia in the past . His Depakote dose was reduced and he was seen by a hematologist. The platelet count has returned to normal. He has history of HTN, atrial fib, hypercholesterolemia , and renal failure. Recent labs reviewed from Dr. Deterding's office. Lamictal added at last visit,  low dose. No further seizure activity.See ROS.   REVIEW OF SYSTEMS: Full 14 system review of systems performed and notable only for: Some unexpected weight change, cold intolerance heat intolerance, environmental allergies, constipation  ALLERGIES: Allergies  Allergen Reactions  . Diphenhydramine Hcl Palpitations    HOME MEDICATIONS: Outpatient Prescriptions Prior to Visit  Medication Sig Dispense Refill    . allopurinol (ZYLOPRIM) 300 MG tablet Take 300 mg by mouth daily.      Marland Kitchen amiodarone (PACERONE) 200 MG tablet Take 200 mg by mouth daily.      Marland Kitchen aspirin 81 MG tablet Take 81 mg by mouth every morning.       Marland Kitchen b complex-vitamin c-folic acid (NEPHRO-VITE) 0.8 MG TABS tablet Take 0.8 mg by mouth daily.       . cinacalcet (SENSIPAR) 30 MG tablet Take 30 mg by mouth daily.       . divalproex (DEPAKOTE ER) 250 MG 24 hr tablet Take 1 tablet (250 mg total) by mouth daily. Do not fill now-please put on file. Patient just picked up.  90 tablet  1  . lamoTRIgine (LAMICTAL) 25 MG tablet Take 1 tablet (25 mg total) by mouth 2 (two) times daily.  180 tablet  5  . levETIRAcetam (KEPPRA) 500 MG tablet Take 1 tablet (500 mg total) by mouth 2 (two) times daily.  180 tablet  5  . omeprazole (PRILOSEC) 20 MG capsule Take 1 capsule by mouth daily.       No facility-administered medications prior to visit.    PHYSICAL EXAM  Filed Vitals:   08/07/13 1155  BP: 139/92  Pulse: 89  Weight: 158 lb (71.668 kg)   Body mass index is 26.29 kg/(m^2).  Generalized: Well developed, in no acute distress  Head: normocephalic and atraumatic. Oropharynx benign  Neck: Supple, no carotid bruits  Cardiac: Regular rate rhythm, no murmur, left forearm A/V graft +/+ Musculoskeletal: No deformity   NEUROLOGIC:  MENTAL STATUS: awake, alert, language fluent, comprehension intact, naming intact  CRANIAL NERVE: pupils equal and reactive to light, visual fields full to confrontation, extraocular muscles intact, no nystagmus, facial sensation and strength symmetric, uvula midline, shoulder shrug symmetric, tongue midline.  MOTOR: normal bulk and tone, full strength in the BUE, BLE no tremor  SENSORY: normal and symmetric to light touch  COORDINATION: finger-nose-finger, fine finger movements, heel-shin normal  REFLEXES: deep tendon reflexes present and symmetric  GAIT/STATION: narrow based gait   ASSESSMENT AND PLAN 53 y.o.  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 No seizure since May 2014.  PLAN:  Continue Depakote ER 1 tablet at bedtime only.  Continue Levetiracetam 1 whole tablet, 500 mg every 12 hours.  Continue Lamictal at current dose.  Follow up in 6 months with Dr. Leta Baptist, sooner as needed.  Philmore Pali, MSN, NP-C 08/07/2013, 4:35 PM [facility] [address] [place],  [address] [phone]  Note: This document was prepared with digital dictation and possible smart phrase technology. Any transcriptional errors that result from this process are unintentional.

## 2013-08-07 NOTE — Patient Instructions (Signed)
Continue Depakote ER to 1 tablet at bedtime only.  Continue Levetiracetam 1 whole tablet, 500 mg Every 12 hours.  Continue Lamictal at current dose.  Follow up in 6 months with Dr. Leta Baptist, sooner as needed.

## 2013-09-04 NOTE — Progress Notes (Signed)
I reviewed note and agree with plan.   Neyda Durango R. Vyom Brass, MD  Certified in Neurology, Neurophysiology and Neuroimaging  [facility] [address], [address] [place], [place] [address] [phone]   

## 2013-09-28 ENCOUNTER — Telehealth: Payer: Self-pay | Admitting: Diagnostic Neuroimaging

## 2013-09-28 NOTE — Telephone Encounter (Signed)
Patient is calling to find out how Dr. Leta Baptist feels about patient driving to [place] with his wife who will share the driving--has not had a seizure in 15 months--please call and advise--thank you.

## 2013-09-29 NOTE — Telephone Encounter (Signed)
I do not see a problem as long as he takes his medications and does not miss hemodialysis.  He has always been very compliant in the past.

## 2013-09-29 NOTE — Telephone Encounter (Signed)
I called and relayed Peter Sloan's message to pt and also to wife on speaker phone.  They both verbalized understanding.

## 2014-01-08 ENCOUNTER — Other Ambulatory Visit: Payer: Self-pay

## 2014-02-08 ENCOUNTER — Ambulatory Visit: Payer: Medicare Other | Admitting: Diagnostic Neuroimaging

## 2014-02-08 ENCOUNTER — Telehealth: Payer: Self-pay | Admitting: Diagnostic Neuroimaging

## 2014-02-08 NOTE — Telephone Encounter (Signed)
Patient calling back regarding rescheduled appointment with Jeani Hawking, NP.  Please call and advise.

## 2014-02-08 NOTE — Telephone Encounter (Signed)
Returned patient's call. No answer. Left vmail.

## 2014-02-10 ENCOUNTER — Ambulatory Visit (INDEPENDENT_AMBULATORY_CARE_PROVIDER_SITE_OTHER): Payer: Medicare Other | Admitting: Diagnostic Neuroimaging

## 2014-02-10 ENCOUNTER — Encounter: Payer: Self-pay | Admitting: Diagnostic Neuroimaging

## 2014-02-10 VITALS — BP 118/78 | HR 85 | Temp 97.2°F | Ht 64.0 in | Wt 161.4 lb

## 2014-02-10 DIAGNOSIS — G40909 Epilepsy, unspecified, not intractable, without status epilepticus: Secondary | ICD-10-CM

## 2014-02-10 MED ORDER — LAMOTRIGINE 25 MG PO TABS: 25.0000 mg | ORAL_TABLET | Freq: Two times a day (BID) | ORAL | Status: DC

## 2014-02-10 MED ORDER — LEVETIRACETAM 500 MG PO TABS: 500.0000 mg | ORAL_TABLET | Freq: Two times a day (BID) | ORAL | Status: DC

## 2014-02-10 MED ORDER — DIVALPROEX SODIUM ER 250 MG PO TB24: 250.0000 mg | ORAL_TABLET | Freq: Every day | ORAL | Status: DC

## 2014-02-10 NOTE — Patient Instructions (Signed)
Continue current medications. 

## 2014-02-10 NOTE — Progress Notes (Signed)
[facility]  PATIENT: Peter Sloan DOB: 1961-03-19  REFERRING CLINICIAN:  HISTORY FROM: patient and wife  REASON FOR VISIT: follow up   HISTORICAL  CHIEF COMPLAINT:  Chief Complaint  Patient presents with  . Follow-up    HISTORY OF PRESENT ILLNESS:   UPDATE 02/10/14: Since last visit, doing well. No tremors. Hair is growing back thicker. No seizures. Tolerating meds.  UPDATE 08/07/13 (LL): Since last visit no further seizures, doing well. No noted tremors. On depakote ER 250mg  daily, lamotrigine 25mg  BID, and levetiracetam 250mg  BID. Since Depakote dose has been reduced his hair has returned. iPTH level rose from 126 to 927 since Dec 2014. No new complaints.   UPDATE 06/05/13 (LL): Since last visit, no further seizures. Tremors are gone. On depakote ER 250mg  BID, lamotrigine 25mg  BID, and levetiracetam 250mg  BID. Exercising daily at the [facility], feels good.   UPDATE 01/05/13 (VP): Since last visit, no further seizures. Kidney transplant failed unfortunately. Tremors are better. On depakote ER 250mg  BID, lamotrigine 25mg  BID, and levetiracetam 250mg  BID.   UPDATE 09/16/12: Patient had kidney transplant on Aug 14, 2012. Hospital admission at [facility] was 10 days. Ureter stent was placed during transplant surgery which had to be replaced and prolonged the admission. Discharged with foley catheter, he could not void in 6 hours so foley was replaced. He currently has foley. Kidney is functioning, no more HD needed. He is on week 3 of 6 weeks of in-home therapy. Ambulates with walker but needed wheelchair for long distances. Has had 9 falls in 12 days. Appears very deconditioned. Tremors are worse since surgery, affecting writing, feeding self. Currently on VPA 1000mg  BID and LTG 25mg  BID   UPDATE 07/28/12: Since last visit, had breakthrough sz after dialysis session (told me that double volume was removed inadvertantly). Since then I re-adjusted his meds to VPA 1000mg  BID and  LTG 50mg  BID. Now in review of records, I think he may still be on 25mg  BID, but he is not sure.   UPDATE 05/28/12: Since last visit, no sz. Tolerating meds. No new events.   UPDATE 02/20/12: Since last visit, now on dialysis. Also, had breakthrough sz on 01/11/12 (admitted x 1 day). Now on incr VPA 750mg  BID and continues on LTG 25mg  BID. No triggering factors for breakthrough sz.   UPDATE 06/26/11: Last convulsive seizure was in February 2012. Most recent abnormal spell was 05/05/2011, following a stress test. He had been laying down at home, felt a abnormal sensation in the left occipital region with a pop sensation, followed by a rush sensation in his face down to his stomach with a sour stomach sensation. He thinks this may have been a petit mall seizure. He was fully aware and conscious during the episode. Episode was similar but somewhat different than his prior episodes. In response to this event he was started on low dose Lamictal 25 mg twice a day. Since that time he has had no further events.   PRIOR HPI: 53 -year-old, right handed, Caucasian male who returns today for followup for history of longstanding seizure disorder. He has a single kidney with renal dysfunction. 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 He has never required dialysis however he is on the transplant list at [facility]. His last major seizure was 05/15/10 and seen in the ER. No loss of B/B, did not bite his tongue. He has had a total of 2 seizures over the last few years. He has had problems  with thrombocytopenia in the past . His Depakote dose was reduced and he was seen by a hematologist. The platelet count has returned to normal. He has history of HTN, atrial fib, hypercholesterolemia , and renal failure. Recent labs reviewed from Dr. Deterding's office. Lamictal added at last visit, low dose. No further seizure activity. See ROS.    REVIEW OF SYSTEMS: Full 14 system review of systems performed and notable only  for constipation itching ESRD.   ALLERGIES: Allergies  Allergen Reactions  . Diphenhydramine Hcl Palpitations    HOME MEDICATIONS: Outpatient Prescriptions Prior to Visit  Medication Sig Dispense Refill  . allopurinol (ZYLOPRIM) 300 MG tablet Take 300 mg by mouth daily.    Marland Kitchen amiodarone (PACERONE) 200 MG tablet Take 200 mg by mouth daily.    Marland Kitchen aspirin 81 MG tablet Take 81 mg by mouth every morning.     Marland Kitchen b complex-vitamin c-folic acid (NEPHRO-VITE) 0.8 MG TABS tablet Take 0.8 mg by mouth daily.     . cinacalcet (SENSIPAR) 30 MG tablet Take 30 mg by mouth daily.     Marland Kitchen omeprazole (PRILOSEC) 20 MG capsule Take 1 capsule by mouth daily.    . divalproex (DEPAKOTE ER) 250 MG 24 hr tablet Take 1 tablet (250 mg total) by mouth daily. Do not fill now-please put on file. Patient just picked up. 90 tablet 1  . lamoTRIgine (LAMICTAL) 25 MG tablet Take 1 tablet (25 mg total) by mouth 2 (two) times daily. 180 tablet 5  . levETIRAcetam (KEPPRA) 500 MG tablet Take 1 tablet (500 mg total) by mouth 2 (two) times daily. 180 tablet 5   No facility-administered medications prior to visit.    PAST MEDICAL HISTORY: Past Medical History  Diagnosis Date  . Congenital solitary kidney   . Chronic renal insufficiency     2/2 ischemic ATN, GFR 15-17 ml/min, baseline Cr: 4-4.5, never had a renal biopsy, s/p AV fistula placement in 2007, f/b nephrologist, Dr. Dorothe Pea, [place]  . Nonischemic cardiomyopathy     EF 20%(9/06) with most recent EF 50%(3/08), valves normal per 2D echo (3/08), normal doppler and color flow study(3/08) s/p endocmyocardial  biopsy: mild myocyte hypertrophy no myocyte damage or inflammation  . Seizure disorder     complex- partial, h/o sz disorder as a child  . Gout     h/o: many in Rt big toe and Rt Knee, never had aspirartion diagnosis  . Atrial fibrillation   . Thyroid disorder   . High cholesterol   . Seizures   . Congestive heart failure   . Hyperparathyroidism   . End  stage renal disease on dialysis   . 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 Severe protein-calorie malnutrition   . Physical debility     PAST SURGICAL HISTORY: Past Surgical History  Procedure Laterality Date  . Kidney surgery    . Tonsillectomy    . Hernia repair    . Kidney transplant  08/14/12  . Nephrectomy transplanted organ  10/14/12    FAMILY HISTORY: Family History  Problem Relation Age of Onset  . Osteoarthritis Mother   . Hypertension Father   . Muscular dystrophy Brother   . Heart disease Child     one 52 y/o son has MVP    SOCIAL HISTORY:  History   Social History  . Marital Status: Married    Spouse Name: Lattie Haw    Number of Children: 1  . Years of Education:  HS   Occupational History  . Currently unemployed     used to [employer] as Scientist, clinical (histocompatibility and immunogenetics) in office in [place]  . Smoking status: Never Smoker   . Smokeless tobacco: Never Used  . Alcohol Use: No     Comment: social drinker  . Drug Use: No  . Sexual Activity: Yes    Birth Control/ Protection: None   Other Topics Concern  . Not on file   Social History Narrative   Moved from [place] on 08/12/06.   Lives with parents in [place].   Has been married for 58yrs.                    PHYSICAL EXAM  Filed Vitals:   02/10/14 1604  BP: 118/78  Pulse: 85  Temp: 97.2 F (36.2 C)  TempSrc: Oral  Height: 5\' 4"  (1.626 m)  Weight: 161 lb 6.4 oz (73.211 kg)    Body mass index is 27.69 kg/(m^2).  No exam data present  No flowsheet data found.  GENERAL EXAM: Patient is in no distress; well developed, nourished and groomed; neck is supple  CARDIOVASCULAR: Regular rate and rhythm, no murmurs, no carotid bruits  NEUROLOGIC: MENTAL STATUS: awake, alert, language fluent, comprehension intact, naming intact, fund of knowledge appropriate CRANIAL NERVE: no papilledema on fundoscopic exam, pupils equal and reactive to light, visual fields full  to confrontation, extraocular muscles intact, no nystagmus, facial sensation and strength symmetric, hearing intact, palate elevates symmetrically, uvula midline, shoulder shrug symmetric, tongue midline. MOTOR: normal bulk and tone, full strength in the BUE, BLE SENSORY: normal and symmetric to light touch COORDINATION: finger-nose-finger, fine finger movements normal REFLEXES: deep tendon reflexes present and symmetric GAIT/STATION: narrow based gait; SLIGHT DIFF WITH TANDEM; romberg is negative    DIAGNOSTIC DATA (LABS, IMAGING, TESTING) - I reviewed patient records, labs, notes, testing and imaging myself where available.  Lab Results  Component Value Date   WBC 7.2 01/12/2012   HGB 13.9 07/05/2012   HCT 41.0 07/05/2012   MCV 99.0 01/12/2012   PLT 217 01/12/2012      Component Value Date/Time   NA 137 07/05/2012 2051   NA 147* 09/09/2008 1450   K 3.6 07/05/2012 2051   K 5.0* 09/09/2008 1450   CL 97 07/05/2012 2051   CL 111* 09/09/2008 1450   CO2 29 01/13/2012 0950   CO2 26 09/09/2008 1450   GLUCOSE 81 07/05/2012 2051   GLUCOSE 106 09/09/2008 1450   BUN 10 07/05/2012 2051   BUN 51* 09/09/2008 1450   CREATININE 3.20* 07/05/2012 2051   CREATININE 4.7* 09/09/2008 1450   CALCIUM 9.3 01/13/2012 0950   CALCIUM 10.8* 09/09/2008 1450   PROT 4.7* 09/16/2012 1453   PROT 6.6 01/12/2012 1710   PROT 6.4 09/09/2008 1450   ALBUMIN 3.5 01/12/2012 1710   AST 27 09/16/2012 1453   AST 24 09/09/2008 1450   ALT 45* 09/16/2012 1453   ALT 21 09/09/2008 1450   ALKPHOS 68 09/16/2012 1453   ALKPHOS 38 09/09/2008 1450   BILITOT 0.3 09/16/2012 1453   BILITOT 0.70 09/09/2008 1450   GFRNONAA 13* 01/13/2012 0950   GFRAA 15* 01/13/2012 0950   Lab Results  Component Value Date   CHOL 171 08/17/2008   HDL 41 08/17/2008   LDLCALC 107* 08/17/2008   TRIG 115 08/17/2008   CHOLHDL 4.2 Ratio 08/17/2008   No results found for: HGBA1C No results found for: [id number] Lab Results  Component  Value Date   TSH 2.046 01/13/2012    06/20/06 MRI brain - slightly smaller right hippocamus than left; 2 small curvilinear areas of T1 and FLAIR signal in the right posterior parietal gray matter, 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 Suboptimal study due to motion. Endotracheal tube is partially visualized.    ASSESSMENT AND PLAN  53 y.o. year old male here with past medical history of Congenital solitary kidney; Chronic renal insufficiency; Nonischemic cardiomyopathy; Seizure disorder; Gout; Atrial fibrillation; Thyroid disorder; High cholesterol; Seizures; Congestive heart failure; Hyperparathyroidism, recent kidney transplant; transplant failure, now ESRD on HD. 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 Last seizure 07/05/12.   PLAN: - continue LEV 500mg  BID, LTG 25mg  BID, VPA 250mg  qhs  Meds ordered this encounter  Medications  . divalproex (DEPAKOTE ER) 250 MG 24 hr tablet    Sig: Take 1 tablet (250 mg total) by mouth daily.    Dispense:  90 tablet    Refill:  4  . lamoTRIgine (LAMICTAL) 25 MG tablet    Sig: Take 1 tablet (25 mg total) by mouth 2 (two) times daily.    Dispense:  180 tablet    Refill:  4  . levETIRAcetam (KEPPRA) 500 MG tablet    Sig: Take 1 tablet (500 mg total) by mouth 2 (two) times daily.    Dispense:  180 tablet    Refill:  4    Return in about 6 months (around 08/11/2014).    Penni Bombard, MD [id number], 0000000 PM Certified in Neurology, Neurophysiology and Neuroimaging  [facility] [address] [place], [place] [address] [phone]

## 2014-08-16 ENCOUNTER — Ambulatory Visit (INDEPENDENT_AMBULATORY_CARE_PROVIDER_SITE_OTHER): Payer: Medicare Other | Admitting: Diagnostic Neuroimaging

## 2014-08-16 ENCOUNTER — Encounter: Payer: Self-pay | Admitting: Diagnostic Neuroimaging

## 2014-08-16 VITALS — BP 124/79 | HR 93 | Ht 63.0 in | Wt 161.6 lb

## 2014-08-16 DIAGNOSIS — G40909 Epilepsy, unspecified, not intractable, without status epilepticus: Secondary | ICD-10-CM | POA: Diagnosis not present

## 2014-08-16 MED ORDER — LEVETIRACETAM 500 MG PO TABS: 500.0000 mg | ORAL_TABLET | Freq: Two times a day (BID) | ORAL | Status: DC

## 2014-08-16 MED ORDER — DIVALPROEX SODIUM ER 250 MG PO TB24: 250.0000 mg | ORAL_TABLET | Freq: Every day | ORAL | Status: DC

## 2014-08-16 MED ORDER — LAMOTRIGINE 25 MG PO TABS: 25.0000 mg | ORAL_TABLET | Freq: Two times a day (BID) | ORAL | Status: DC

## 2014-08-16 NOTE — Progress Notes (Signed)
[facility]  PATIENT: Peter Sloan DOB: 1960/07/09  REFERRING CLINICIAN:  HISTORY FROM: patient and wife  REASON FOR VISIT: follow up   HISTORICAL  CHIEF COMPLAINT:  Chief Complaint  Patient presents with  . Follow-up    seizure disorder     HISTORY OF PRESENT ILLNESS:   UPDATE 08/16/14: Since last visit, doing well. No seizures. Tolerating meds.   UPDATE 02/10/14: Since last visit, doing well. No tremors. Hair is growing back thicker. No seizures. Tolerating meds.  UPDATE 08/07/13 (LL): Since last visit no further seizures, doing well. No noted tremors. On depakote ER 250mg  daily, lamotrigine 25mg  BID, and levetiracetam 250mg  BID. Since Depakote dose has been reduced his hair has returned. iPTH level rose from 126 to 927 since Dec 2014. No new complaints.   UPDATE 06/05/13 (LL): Since last visit, no further seizures. Tremors are gone. On depakote ER 250mg  BID, lamotrigine 25mg  BID, and levetiracetam 250mg  BID. Exercising daily at the [facility], feels good.   UPDATE 01/05/13 (VP): Since last visit, no further seizures. Kidney transplant failed unfortunately. Tremors are better. On depakote ER 250mg  BID, lamotrigine 25mg  BID, and levetiracetam 250mg  BID.   UPDATE 09/16/12: Patient had kidney transplant on Aug 14, 2012. Hospital admission at [facility] was 10 days. Ureter stent was placed during transplant surgery which had to be replaced and prolonged the admission. Discharged with foley catheter, he could not void in 6 hours so foley was replaced. He currently has foley. Kidney is functioning, no more HD needed. He is on week 3 of 6 weeks of in-home therapy. Ambulates with walker but needed wheelchair for long distances. Has had 9 falls in 12 days. Appears very deconditioned. Tremors are worse since surgery, affecting writing, feeding self. Currently on VPA 1000mg  BID and LTG 25mg  BID   UPDATE 07/28/12: Since last visit, had breakthrough sz after dialysis session (told me  that double volume was removed inadvertantly). Since then I re-adjusted his meds to VPA 1000mg  BID and LTG 50mg  BID. Now in review of records, I think he may still be on 25mg  BID, but he is not sure.   UPDATE 05/28/12: Since last visit, no sz. Tolerating meds. No new events.   UPDATE 02/20/12: Since last visit, now on dialysis. Also, had breakthrough sz on 01/11/12 (admitted x 1 day). Now on incr VPA 750mg  BID and continues on LTG 25mg  BID. No triggering factors for breakthrough sz.   UPDATE 06/26/11: Last convulsive seizure was in February 2012. Most recent abnormal spell was 05/05/2011, following a stress test. He had been laying down at home, felt a abnormal sensation in the left occipital region with a pop sensation, followed by a rush sensation in his face down to his stomach with a sour stomach sensation. He thinks this may have been a petit mall seizure. He was fully aware and conscious during the episode. Episode was similar but somewhat different than his prior episodes. In response to this event he was started on low dose Lamictal 25 mg twice a day. Since that time he has had no further events.   PRIOR HPI: 54 -year-old, right handed, Caucasian male who returns today for followup for history of longstanding seizure disorder. He has a single kidney with renal dysfunction. 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 He has never required dialysis however he is on the transplant list at [facility]. His last major seizure was 05/15/10 and seen in the ER. No loss of B/B, did not bite  his tongue. He has had a total of 2 seizures over the last few years. He has had problems with thrombocytopenia in the past . His Depakote dose was reduced and he was seen by a hematologist. The platelet count has returned to normal. He has history of HTN, atrial fib, hypercholesterolemia , and renal failure. Recent labs reviewed from Dr. Deterding's office. Lamictal added at last visit, low dose. No further seizure  activity. See ROS.    REVIEW OF SYSTEMS: Full 14 system review of systems performed and notable only for constipation cough runny nose snoring env allergies easy bruising murmur.    ALLERGIES: Allergies  Allergen Reactions  . Diphenhydramine Hcl Palpitations    HOME MEDICATIONS: Outpatient Prescriptions Prior to Visit  Medication Sig Dispense Refill  . allopurinol (ZYLOPRIM) 300 MG tablet Take 300 mg by mouth daily.    Marland Kitchen amiodarone (PACERONE) 200 MG tablet Take 200 mg by mouth daily.    Marland Kitchen aspirin 81 MG tablet Take 81 mg by mouth every morning.     Marland Kitchen b complex-vitamin c-folic acid (NEPHRO-VITE) 0.8 MG TABS tablet Take 0.8 mg by mouth daily.     . cinacalcet (SENSIPAR) 30 MG tablet Take 30 mg by mouth daily.     . divalproex (DEPAKOTE ER) 250 MG 24 hr tablet Take 1 tablet (250 mg total) by mouth daily. 90 tablet 4  . lamoTRIgine (LAMICTAL) 25 MG tablet Take 1 tablet (25 mg total) by mouth 2 (two) times daily. 180 tablet 4  . levETIRAcetam (KEPPRA) 500 MG tablet Take 1 tablet (500 mg total) by mouth 2 (two) times daily. 180 tablet 4  . omeprazole (PRILOSEC) 20 MG capsule Take 1 capsule by mouth daily.     No facility-administered medications prior to visit.    PAST MEDICAL HISTORY: Past Medical History  Diagnosis Date  . Congenital solitary kidney   . Chronic renal insufficiency     2/2 ischemic ATN, GFR 15-17 ml/min, baseline Cr: 4-4.5, never had a renal biopsy, s/p AV fistula placement in 2007, f/b nephrologist, Dr. Dorothe Pea, [place]  . Nonischemic cardiomyopathy     EF 20%(9/06) with most recent EF 50%(3/08), valves normal per 2D echo (3/08), normal doppler and color flow study(3/08) s/p endocmyocardial  biopsy: mild myocyte hypertrophy no myocyte damage or inflammation  . Seizure disorder     complex- partial, h/o sz disorder as a child  . Gout     h/o: many in Rt big toe and Rt Knee, never had aspirartion diagnosis  . Atrial fibrillation   . Thyroid disorder   . High  cholesterol   . Seizures   . Congestive heart failure   . Hyperparathyroidism   . End stage renal disease on dialysis   . 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 Severe protein-calorie malnutrition   . Physical debility     PAST SURGICAL HISTORY: Past Surgical History  Procedure Laterality Date  . Kidney surgery    . Tonsillectomy    . Hernia repair    . Kidney transplant  08/14/12  . Nephrectomy transplanted organ  10/14/12    FAMILY HISTORY: Family History  Problem Relation Age of Onset  . Osteoarthritis Mother   . Hypertension Father   . Muscular dystrophy Brother   . Heart disease Child     one 51 y/o son has MVP    SOCIAL HISTORY:  History   Social History  . Marital Status: Married  Spouse Name: Lattie Haw  . Number of Children: 1  . Years of Education: HS   Occupational History  . Currently unemployed     used to [employer] as Scientist, clinical (histocompatibility and immunogenetics) in office in [place]  . Smoking status: Never Smoker   . Smokeless tobacco: Never Used  . Alcohol Use: No     Comment: social drinker  . Drug Use: No  . Sexual Activity: Yes    Birth Control/ Protection: None   Other Topics Concern  . Not on file   Social History Narrative   Moved from [place] on 08/12/06.   Lives with parents in [place].   Has been married for 17yrs.                    PHYSICAL EXAM  Filed Vitals:   08/16/14 1341  BP: 124/79  Pulse: 93  Height: 5\' 3"  (1.6 m)  Weight: 161 lb 9.6 oz (73.301 kg)    Body mass index is 28.63 kg/(m^2).  No exam data present  No flowsheet data found.  GENERAL EXAM: Patient is in no distress; well developed, nourished and groomed; neck is supple  CARDIOVASCULAR: Regular rate and rhythm, no murmurs, no carotid bruits  NEUROLOGIC: MENTAL STATUS: awake, alert, language fluent, comprehension intact, naming intact, fund of knowledge appropriate CRANIAL NERVE: no papilledema on fundoscopic exam, pupils  PINPOINT and reactive to light, visual fields full to confrontation, extraocular muscles intact, no nystagmus, facial sensation and strength symmetric, hearing intact, palate elevates symmetrically, uvula midline, shoulder shrug symmetric, tongue midline. MOTOR: normal bulk and tone, full strength in the BUE, BLE SENSORY: normal and symmetric to light touch COORDINATION: finger-nose-finger, fine finger movements normal REFLEXES: deep tendon reflexes present and symmetric GAIT/STATION: narrow based gait     DIAGNOSTIC DATA (LABS, IMAGING, TESTING) - I reviewed patient records, labs, notes, testing and imaging myself where available.  Lab Results  Component Value Date   WBC 7.2 01/12/2012   HGB 13.9 07/05/2012   HCT 41.0 07/05/2012   MCV 99.0 01/12/2012   PLT 217 01/12/2012      Component Value Date/Time   NA 137 07/05/2012 2051   NA 147* 09/09/2008 1450   K 3.6 07/05/2012 2051   K 5.0* 09/09/2008 1450   CL 97 07/05/2012 2051   CL 111* 09/09/2008 1450   CO2 29 01/13/2012 0950   CO2 26 09/09/2008 1450   GLUCOSE 81 07/05/2012 2051   GLUCOSE 106 09/09/2008 1450   BUN 10 07/05/2012 2051   BUN 51* 09/09/2008 1450   CREATININE 3.20* 07/05/2012 2051   CREATININE 4.7* 09/09/2008 1450   CALCIUM 9.3 01/13/2012 0950   CALCIUM 10.8* 09/09/2008 1450   PROT 4.7* 09/16/2012 1453   PROT 6.6 01/12/2012 1710   PROT 6.4 09/09/2008 1450   ALBUMIN 3.5 01/12/2012 1710   AST 27 09/16/2012 1453   AST 24 09/09/2008 1450   ALT 45* 09/16/2012 1453   ALT 21 09/09/2008 1450   ALKPHOS 68 09/16/2012 1453   ALKPHOS 38 09/09/2008 1450   BILITOT 0.3 09/16/2012 1453   BILITOT 0.70 09/09/2008 1450   GFRNONAA 13* 01/13/2012 0950   GFRAA 15* 01/13/2012 0950   Lab Results  Component Value Date   CHOL 171 08/17/2008   HDL 41 08/17/2008   LDLCALC 107* 08/17/2008   TRIG 115 08/17/2008   CHOLHDL 4.2 Ratio 08/17/2008   No results found for: HGBA1C No results found for: [id number] Lab Results    Component  Value Date   TSH 2.046 01/13/2012    06/20/06 MRI brain - slightly smaller right hippocamus than left; 2 small curvilinear areas of T1 and FLAIR signal in the right posterior parietal gray matter, 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 Suboptimal study due to motion. Endotracheal tube is partially visualized.    ASSESSMENT AND PLAN  54 y.o. year old male here with past medical history of Congenital solitary kidney; Chronic renal insufficiency; Nonischemic cardiomyopathy; Seizure disorder; Gout; Atrial fibrillation; Thyroid disorder; High cholesterol; Seizures; Congestive heart failure; Hyperparathyroidism, recent kidney transplant; transplant failure, now ESRD on HD. 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 Last seizure 07/05/12.   PLAN: - continue LEV 500mg  BID, LTG 25mg  BID, VPA 250mg  qhs - annual CBC, CMP per PCP or nephrology  Meds ordered this encounter  Medications  . divalproex (DEPAKOTE ER) 250 MG 24 hr tablet    Sig: Take 1 tablet (250 mg total) by mouth daily.    Dispense:  90 tablet    Refill:  4  . lamoTRIgine (LAMICTAL) 25 MG tablet    Sig: Take 1 tablet (25 mg total) by mouth 2 (two) times daily.    Dispense:  180 tablet    Refill:  4  . levETIRAcetam (KEPPRA) 500 MG tablet    Sig: Take 1 tablet (500 mg total) by mouth 2 (two) times daily.    Dispense:  180 tablet    Refill:  4   Return in about 1 year (around 08/16/2015).  I spent 15 minutes of face to face time with patient. Greater than 50% of time was spent in counseling and coordination of care with patient.     Penni Bombard, MD [id number], [id number] PM Certified in Neurology, Neurophysiology and Neuroimaging  [facility] [address] [place], [place] [address] [phone]

## 2014-08-16 NOTE — Patient Instructions (Signed)
Continue current medications. 

## 2014-09-30 ENCOUNTER — Telehealth: Payer: Self-pay | Admitting: Diagnostic Neuroimaging

## 2014-09-30 NOTE — Telephone Encounter (Signed)
Ok to proceed with night dialysis. -VRP

## 2014-09-30 NOTE — Telephone Encounter (Signed)
Spoke with patient and discussed possible seizure triggers such as stress, lack of sleep, being over tired. He stated he is aware of those. He states that he can usually tell a couple days ahead due to having petit mal seizures or due to stress, that he may have a seizure. He states he "is covering all his bases" before making a decision. He states "they have been pressuring me for a couple of weeks to make a decision". Informed him this RN will discuss with Dr Leta Baptist and call him back no later than 2 pm tomorrow. He verbalized understanding, appreciation.

## 2014-09-30 NOTE — Telephone Encounter (Signed)
Spoke with patient's wife and informed her that Dr Leta Baptist stated "Ok to proceed with night dialysis". She verbalized understanding, appreciation for quick response.

## 2014-09-30 NOTE — Telephone Encounter (Signed)
Patient called stating he has been asked to go to night shift for dialysis which is 8 hours (half the time and half the flow rate) for better cleaning. He is concerned whether this will or could trigger any seizure episodes. Please call and advise. Patient can be reached [phone]. He stated can talk with wife Lattie Haw.

## 2014-09-30 NOTE — Telephone Encounter (Signed)
Pt called back to confirm going to his night dialysis . Pt was told yes per previous telephone call note. SS

## 2015-08-17 ENCOUNTER — Ambulatory Visit (INDEPENDENT_AMBULATORY_CARE_PROVIDER_SITE_OTHER): Payer: Medicare Other | Admitting: Diagnostic Neuroimaging

## 2015-08-17 ENCOUNTER — Encounter: Payer: Self-pay | Admitting: Diagnostic Neuroimaging

## 2015-08-17 VITALS — BP 106/71 | HR 101 | Ht 63.0 in | Wt 163.6 lb

## 2015-08-17 DIAGNOSIS — Z992 Dependence on renal dialysis: Secondary | ICD-10-CM | POA: Diagnosis not present

## 2015-08-17 DIAGNOSIS — G40909 Epilepsy, unspecified, not intractable, without status epilepticus: Secondary | ICD-10-CM | POA: Diagnosis not present

## 2015-08-17 DIAGNOSIS — N186 End stage renal disease: Secondary | ICD-10-CM | POA: Diagnosis not present

## 2015-08-17 MED ORDER — LAMOTRIGINE 25 MG PO TABS: 25.0000 mg | ORAL_TABLET | Freq: Two times a day (BID) | ORAL | Status: DC

## 2015-08-17 MED ORDER — DIVALPROEX SODIUM ER 250 MG PO TB24: 250.0000 mg | ORAL_TABLET | Freq: Every day | ORAL | Status: DC

## 2015-08-17 MED ORDER — LEVETIRACETAM 500 MG PO TABS: 500.0000 mg | ORAL_TABLET | Freq: Two times a day (BID) | ORAL | Status: DC

## 2015-08-17 NOTE — Progress Notes (Signed)
[facility]  PATIENT: Peter Sloan DOB: 1960/11/11  REFERRING CLINICIAN:  HISTORY FROM: patient and wife  REASON FOR VISIT: follow up   HISTORICAL  CHIEF COMPLAINT:  Chief Complaint  Patient presents with  . Seizures    rm 8, wife- Lattie Haw  . Follow-up    yearly    HISTORY OF PRESENT ILLNESS:   UPDATE 08/17/15: Since last visit, no seizures. Tolerating medications. Still struggling.   UPDATE 08/16/14: Since last visit, doing well. No seizures. Tolerating meds.   UPDATE 02/10/14: Since last visit, doing well. No tremors. Hair is growing back thicker. No seizures. Tolerating meds.  UPDATE 08/07/13 (LL): Since last visit no further seizures, doing well. No noted tremors. On depakote ER 250mg  daily, lamotrigine 25mg  BID, and levetiracetam 250mg  BID. Since Depakote dose has been reduced his hair has returned. iPTH level rose from 126 to 927 since Dec 2014. No new complaints.   UPDATE 06/05/13 (LL): Since last visit, no further seizures. Tremors are gone. On depakote ER 250mg  BID, lamotrigine 25mg  BID, and levetiracetam 250mg  BID. Exercising daily at the [facility], feels good.   UPDATE 01/05/13 (VP): Since last visit, no further seizures. Kidney transplant failed unfortunately. Tremors are better. On depakote ER 250mg  BID, lamotrigine 25mg  BID, and levetiracetam 250mg  BID.   UPDATE 09/16/12: Patient had kidney transplant on Aug 14, 2012. Hospital admission at [facility] was 10 days. Ureter stent was placed during transplant surgery which had to be replaced and prolonged the admission. Discharged with foley catheter, he could not void in 6 hours so foley was replaced. He currently has foley. Kidney is functioning, no more HD needed. He is on week 3 of 6 weeks of in-home therapy. Ambulates with walker but needed wheelchair for long distances. Has had 9 falls in 12 days. Appears very deconditioned. Tremors are worse since surgery, affecting writing, feeding self. Currently on VPA  1000mg  BID and LTG 25mg  BID   UPDATE 07/28/12: Since last visit, had breakthrough sz after dialysis session (told me that double volume was removed inadvertantly). Since then I re-adjusted his meds to VPA 1000mg  BID and LTG 50mg  BID. Now in review of records, I think he may still be on 25mg  BID, but he is not sure.   UPDATE 05/28/12: Since last visit, no sz. Tolerating meds. No new events.   UPDATE 02/20/12: Since last visit, now on dialysis. Also, had breakthrough sz on 01/11/12 (admitted x 1 day). Now on incr VPA 750mg  BID and continues on LTG 25mg  BID. No triggering factors for breakthrough sz.   UPDATE 06/26/11: Last convulsive seizure was in February 2012. Most recent abnormal spell was 05/05/2011, following a stress test. He had been laying down at home, felt a abnormal sensation in the left occipital region with a pop sensation, followed by a rush sensation in his face down to his stomach with a sour stomach sensation. He thinks this may have been a petit mall seizure. He was fully aware and conscious during the episode. Episode was similar but somewhat different than his prior episodes. In response to this event he was started on low dose Lamictal 25 mg twice a day. Since that time he has had no further events.   PRIOR HPI: 55 -year-old, right handed, Caucasian male who returns today for followup for history of longstanding seizure disorder. He has a single kidney with renal dysfunction. 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 He has never required dialysis however he is on the transplant  list at [facility]. His last major seizure was 05/15/10 and seen in the ER. No loss of B/B, did not bite his tongue. He has had a total of 2 seizures over the last few years. He has had problems with thrombocytopenia in the past . His Depakote dose was reduced and he was seen by a hematologist. The platelet count has returned to normal. He has history of HTN, atrial fib, hypercholesterolemia , and renal  failure. Recent labs reviewed from Dr. Deterding's office. Lamictal added at last visit, low dose. No further seizure activity. See ROS.    REVIEW OF SYSTEMS: Full 14 system review of systems performed and negative except for: decr activity light sens cough freq urination.    ALLERGIES: Allergies  Allergen Reactions  . Diphenhydramine Hcl Palpitations    HOME MEDICATIONS: Outpatient Prescriptions Prior to Visit  Medication Sig Dispense Refill  . allopurinol (ZYLOPRIM) 300 MG tablet Take 300 mg by mouth daily.    Marland Kitchen amiodarone (PACERONE) 200 MG tablet Take 200 mg by mouth daily.    Marland Kitchen aspirin 81 MG tablet Take 81 mg by mouth every morning.     Marland Kitchen b complex-vitamin c-folic acid (NEPHRO-VITE) 0.8 MG TABS tablet Take 0.8 mg by mouth daily.     . divalproex (DEPAKOTE ER) 250 MG 24 hr tablet Take 1 tablet (250 mg total) by mouth daily. 90 tablet 4  . lamoTRIgine (LAMICTAL) 25 MG tablet Take 1 tablet (25 mg total) by mouth 2 (two) times daily. 180 tablet 4  . levETIRAcetam (KEPPRA) 500 MG tablet Take 1 tablet (500 mg total) by mouth 2 (two) times daily. 180 tablet 4  . Omega-3 Fatty Acids (FISH OIL) 1000 MG CAPS Take by mouth.    . cinacalcet (SENSIPAR) 30 MG tablet Take 30 mg by mouth daily.      No facility-administered medications prior to visit.    PAST MEDICAL HISTORY: Past Medical History  Diagnosis Date  . Congenital solitary kidney   . Chronic renal insufficiency     2/2 ischemic ATN, GFR 15-17 ml/min, baseline Cr: 4-4.5, never had a renal biopsy, s/p AV fistula placement in 2007, f/b nephrologist, Dr. Dorothe Pea, [place]  . Nonischemic cardiomyopathy (HCC)     EF 20%(9/06) with most recent EF 50%(3/08), valves normal per 2D echo (3/08), normal doppler and color flow study(3/08) s/p endocmyocardial  biopsy: mild myocyte hypertrophy no myocyte damage or inflammation  . Seizure disorder ([place])     complex- partial, h/o sz disorder as a child  . Gout     h/o: many in Rt big toe  and Rt Knee, never had aspirartion diagnosis  . Atrial fibrillation ([place])   . Thyroid disorder   . High cholesterol   . Congestive heart failure ([place])   . Hyperparathyroidism ([place])   . End stage renal disease on dialysis ([place])     fistula left arm  . 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 Severe protein-calorie malnutrition ([place])   . Physical debility   . Seizures ([place])     last sz 07/05/12 grand mal    PAST SURGICAL HISTORY: Past Surgical History  Procedure Laterality Date  . Kidney surgery    . Tonsillectomy    . Hernia repair    . Kidney transplant  08/14/12  . Nephrectomy transplanted organ  10/14/12    FAMILY HISTORY: Family History  Problem Relation Age of Onset  . Osteoarthritis Mother   . Hypertension  Father   . Muscular dystrophy Brother   . Heart disease Child     one 47 y/o son has MVP    SOCIAL HISTORY:  Social History   Social History  . Marital Status: Married    Spouse Name: Lattie Haw  . Number of Children: 1  . Years of Education: HS   Occupational History  . Currently unemployed     used to [employer] as Scientist, clinical (histocompatibility and immunogenetics) in office in [place]  . Smoking status: Never Smoker   . Smokeless tobacco: Never Used  . Alcohol Use: No     Comment: social drinker  . Drug Use: No  . Sexual Activity: Yes    Birth Control/ Protection: None   Other Topics Concern  . Not on file   Social History Narrative   Moved from [place] on 08/12/06.   Lives with parents in [place].   Has been married for 44yrs.                   PHYSICAL EXAM  Filed Vitals:   08/17/15 1359  BP: 106/71  Pulse: 101  Height: 5\' 3"  (1.6 m)  Weight: 163 lb 9.6 oz (74.208 kg)    Body mass index is 28.99 kg/(m^2).  No exam data present  No flowsheet data found.  GENERAL EXAM: Patient is in no distress; well developed, nourished and groomed; neck is supple  CARDIOVASCULAR: Regular rate and rhythm, no murmurs, no  carotid bruits  NEUROLOGIC: MENTAL STATUS: awake, alert, language fluent, comprehension intact, naming intact, fund of knowledge appropriate CRANIAL NERVE: no papilledema on fundoscopic exam, pupils PINPOINT and reactive to light, visual fields full to confrontation, extraocular muscles intact, no nystagmus, facial sensation and strength symmetric, hearing intact, palate elevates symmetrically, uvula midline, shoulder shrug symmetric, tongue midline. MOTOR: normal bulk and tone, full strength in the BUE, BLE SENSORY: normal and symmetric to light touch COORDINATION: finger-nose-finger, fine finger movements normal REFLEXES: deep tendon reflexes present and symmetric GAIT/STATION: narrow based gait     DIAGNOSTIC DATA (LABS, IMAGING, TESTING) - I reviewed patient records, labs, notes, testing and imaging myself where available.  Lab Results  Component Value Date   WBC 7.2 01/12/2012   HGB 13.9 07/05/2012   HCT 41.0 07/05/2012   MCV 99.0 01/12/2012   PLT 217 01/12/2012      Component Value Date/Time   NA 137 07/05/2012 2051   NA 147* 09/09/2008 1450   K 3.6 07/05/2012 2051   K 5.0* 09/09/2008 1450   CL 97 07/05/2012 2051   CL 111* 09/09/2008 1450   CO2 29 01/13/2012 0950   CO2 26 09/09/2008 1450   GLUCOSE 81 07/05/2012 2051   GLUCOSE 106 09/09/2008 1450   BUN 10 07/05/2012 2051   BUN 51* 09/09/2008 1450   CREATININE 3.20* 07/05/2012 2051   CREATININE 4.7* 09/09/2008 1450   CALCIUM 9.3 01/13/2012 0950   CALCIUM 10.8* 09/09/2008 1450   PROT 4.7* 09/16/2012 1453   PROT 6.6 01/12/2012 1710   PROT 6.4 09/09/2008 1450   ALBUMIN 3.2* 09/16/2012 1453   ALBUMIN 3.5 01/12/2012 1710   AST 27 09/16/2012 1453   AST 24 09/09/2008 1450   ALT 45* 09/16/2012 1453   ALT 21 09/09/2008 1450   ALKPHOS 68 09/16/2012 1453   ALKPHOS 38 09/09/2008 1450   BILITOT 0.3 09/16/2012 1453   BILITOT 0.70 09/09/2008 1450   GFRNONAA 13* 01/13/2012 0950   GFRAA 15* 01/13/2012 0950   Lab Results  Component Value Date   CHOL 171 08/17/2008   HDL 41 08/17/2008   LDLCALC 107* 08/17/2008   TRIG 115 08/17/2008   CHOLHDL 4.2 Ratio 08/17/2008   No results found for: HGBA1C No results found for: VITAMINB12 Lab Results  Component Value Date   TSH 2.046 01/13/2012    06/20/06 MRI brain - slightly smaller right hippocamus than left; 2 small curvilinear areas of T1 and FLAIR signal in the right posterior parietal gray matter, 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 Suboptimal study due to motion. Endotracheal tube is partially visualized.    ASSESSMENT AND PLAN  55 y.o. 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   S/p kidney transplant; transplant failure, now ESRD on HD (Tu, Thurs, Sat). 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 Last seizure 07/05/12.   Dx:  Seizure disorder ([place])  ESRD on dialysis ([place])    PLAN: I spent 25 minutes of face to face time with patient. Greater than 50% of time was spent in counseling and coordination of care with patient. In summary we discussed:   - continue LEV 500mg  BID, LTG 25mg  BID, VPA 250mg  qhs - annual CBC, CMP per PCP or nephrology  Meds ordered this encounter  Medications  . levETIRAcetam (KEPPRA) 500 MG tablet    Sig: Take 1 tablet (500 mg total) by mouth 2 (two) times daily.    Dispense:  180 tablet    Refill:  4  . lamoTRIgine (LAMICTAL) 25 MG tablet    Sig: Take 1 tablet (25 mg  total) by mouth 2 (two) times daily.    Dispense:  180 tablet    Refill:  4  . divalproex (DEPAKOTE ER) 250 MG 24 hr tablet    Sig: Take 1 tablet (250 mg total) by mouth daily.    Dispense:  90 tablet    Refill:  4   Return in about 1 year (around 08/16/2016).    Penni Bombard, MD [id number], [id number] PM Certified in Neurology, Neurophysiology and Neuroimaging  [facility] [address] [place], [place] [address] [phone]

## 2015-08-17 NOTE — Patient Instructions (Signed)
-  continue current medications

## 2015-10-30 ENCOUNTER — Other Ambulatory Visit: Payer: Self-pay | Admitting: Diagnostic Neuroimaging

## 2016-08-08 ENCOUNTER — Telehealth: Payer: Self-pay | Admitting: *Deleted

## 2016-08-08 NOTE — Telephone Encounter (Signed)
LVM informing patient his FU on 08/15/16 needs to be rescheduled; dr will be out of office. Advised Dr Leta Baptist can see him this Friday or another time further out if that is not convenient.  Requested he call back; left number.

## 2016-08-15 ENCOUNTER — Ambulatory Visit: Payer: Self-pay | Admitting: Diagnostic Neuroimaging

## 2016-09-16 ENCOUNTER — Other Ambulatory Visit: Payer: Self-pay | Admitting: Diagnostic Neuroimaging

## 2016-09-17 ENCOUNTER — Encounter: Payer: Self-pay | Admitting: Diagnostic Neuroimaging

## 2016-09-17 ENCOUNTER — Ambulatory Visit (INDEPENDENT_AMBULATORY_CARE_PROVIDER_SITE_OTHER): Payer: Medicare Other | Admitting: Diagnostic Neuroimaging

## 2016-09-17 VITALS — BP 129/80 | HR 81 | Wt 157.0 lb

## 2016-09-17 DIAGNOSIS — G40909 Epilepsy, unspecified, not intractable, without status epilepticus: Secondary | ICD-10-CM

## 2016-09-17 DIAGNOSIS — N186 End stage renal disease: Secondary | ICD-10-CM | POA: Diagnosis not present

## 2016-09-17 DIAGNOSIS — Z992 Dependence on renal dialysis: Secondary | ICD-10-CM | POA: Diagnosis not present

## 2016-09-17 MED ORDER — LAMOTRIGINE 25 MG PO TABS: 25.0000 mg | ORAL_TABLET | Freq: Two times a day (BID) | ORAL | 4 refills | Status: DC

## 2016-09-17 MED ORDER — DIVALPROEX SODIUM ER 250 MG PO TB24: 250.0000 mg | ORAL_TABLET | Freq: Every day | ORAL | 4 refills | Status: DC

## 2016-09-17 MED ORDER — LEVETIRACETAM 500 MG PO TABS: 500.0000 mg | ORAL_TABLET | Freq: Two times a day (BID) | ORAL | 4 refills | Status: DC

## 2016-09-17 NOTE — Progress Notes (Signed)
[facility]  PATIENT: Peter Sloan DOB: December 20, 1960  REFERRING CLINICIAN:  HISTORY FROM: patient REASON FOR VISIT: follow up   HISTORICAL  CHIEF COMPLAINT:  Chief Complaint  Patient presents with  . Seizures    rm 6, "doing well, no seizure activity; need refills on medications"  . Follow-up    one year    HISTORY OF PRESENT ILLNESS:   UPDATE 09/17/16: Since last visit, tolerating meds. No seizures. Notes some changes in urination related to his keppra medication.   UPDATE 08/17/15: Since last visit, no seizures. Tolerating medications.   UPDATE 08/16/14: Since last visit, doing well. No seizures. Tolerating meds.   UPDATE 02/10/14: Since last visit, doing well. No tremors. Hair is growing back thicker. No seizures. Tolerating meds.  UPDATE 08/07/13 (LL): Since last visit no further seizures, doing well. No noted tremors. On depakote ER 250mg  daily, lamotrigine 25mg  BID, and levetiracetam 250mg  BID. Since Depakote dose has been reduced his hair has returned. iPTH level rose from 126 to 927 since Dec 2014. No new complaints.   UPDATE 06/05/13 (LL): Since last visit, no further seizures. Tremors are gone. On depakote ER 250mg  BID, lamotrigine 25mg  BID, and levetiracetam 250mg  BID. Exercising daily at the [facility], feels good.   UPDATE 01/05/13 (VP): Since last visit, no further seizures. Kidney transplant failed unfortunately. Tremors are better. On depakote ER 250mg  BID, lamotrigine 25mg  BID, and levetiracetam 250mg  BID.   UPDATE 09/16/12: Patient had kidney transplant on Aug 14, 2012. Hospital admission at [facility] was 10 days. Ureter stent was placed during transplant surgery which had to be replaced and prolonged the admission. Discharged with foley catheter, he could not void in 6 hours so foley was replaced. He currently has foley. Kidney is functioning, no more HD needed. He is on week 3 of 6 weeks of in-home therapy. Ambulates with walker but needed wheelchair  for long distances. Has had 9 falls in 12 days. Appears very deconditioned. Tremors are worse since surgery, affecting writing, feeding self. Currently on VPA 1000mg  BID and LTG 25mg  BID   UPDATE 07/28/12: Since last visit, had breakthrough sz after dialysis session (told me that double volume was removed inadvertantly). Since then I re-adjusted his meds to VPA 1000mg  BID and LTG 50mg  BID. Now in review of records, I think he may still be on 25mg  BID, but he is not sure.   UPDATE 05/28/12: Since last visit, no sz. Tolerating meds. No new events.   UPDATE 02/20/12: Since last visit, now on dialysis. Also, had breakthrough sz on 01/11/12 (admitted x 1 day). Now on incr VPA 750mg  BID and continues on LTG 25mg  BID. No triggering factors for breakthrough sz.   UPDATE 06/26/11: Last convulsive seizure was in February 2012. Most recent abnormal spell was 05/05/2011, following a stress test. He had been laying down at home, felt a abnormal sensation in the left occipital region with a pop sensation, followed by a rush sensation in his face down to his stomach with a sour stomach sensation. He thinks this may have been a petit mall seizure. He was fully aware and conscious during the episode. Episode was similar but somewhat different than his prior episodes. In response to this event he was started on low dose Lamictal 25 mg twice a day. Since that time he has had no further events.   PRIOR HPI: 56 -year-old, right handed, Caucasian male who returns today for followup for history of longstanding seizure disorder. He has a single kidney  with renal dysfunction. 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 He has never required dialysis however he is on the transplant list at [facility]. His last major seizure was 05/15/10 and seen in the ER. No loss of B/B, did not bite his tongue. He has had a total of 2 seizures over the last few years. He has had problems with thrombocytopenia in the past . His Depakote dose  was reduced and he was seen by a hematologist. The platelet count has returned to normal. He has history of HTN, atrial fib, hypercholesterolemia , and renal failure. Recent labs reviewed from Dr. Deterding's office. Lamictal added at last visit, low dose. No further seizure activity. See ROS.    REVIEW OF SYSTEMS: Full 14 system review of systems performed and negative except for: constipation nausea env allergies.    ALLERGIES: Allergies  Allergen Reactions  . Diphenhydramine Hcl Palpitations    HOME MEDICATIONS: Outpatient Medications Prior to Visit  Medication Sig Dispense Refill  . allopurinol (ZYLOPRIM) 300 MG tablet Take 300 mg by mouth daily.    Marland Kitchen amiodarone (PACERONE) 200 MG tablet Take 200 mg by mouth daily.    . Ascorbic Acid (VITAMIN C) 1000 MG tablet Take 1,000 mg by mouth daily.    Marland Kitchen aspirin 81 MG tablet Take 81 mg by mouth every morning.     Marland Kitchen b complex-vitamin c-folic acid (NEPHRO-VITE) 0.8 MG TABS tablet Take 0.8 mg by mouth daily.     . divalproex (DEPAKOTE ER) 250 MG 24 hr tablet Take 1 tablet (250 mg total) by mouth daily. 90 tablet 4  . lamoTRIgine (LAMICTAL) 25 MG tablet Take 1 tablet (25 mg total) by mouth 2 (two) times daily. 180 tablet 4  . levETIRAcetam (KEPPRA) 500 MG tablet Take 1 tablet (500 mg total) by mouth 2 (two) times daily. 180 tablet 4  . Omega-3 Fatty Acids (FISH OIL) 1000 MG CAPS Take by mouth.    . SENSIPAR 60 MG tablet Take 60 mg by mouth daily.  3  . RENVELA 800 MG tablet 3 with each meal ,1 with snack  10   No facility-administered medications prior to visit.     PAST MEDICAL HISTORY: Past Medical History:  Diagnosis Date  . Atrial fibrillation ([place])   . Chronic renal insufficiency    2/2 ischemic ATN, GFR 15-17 ml/min, baseline Cr: 4-4.5, never had a renal biopsy, s/p AV fistula placement in 2007, f/b nephrologist, Dr. Dorothe Pea, [place]  . Congenital solitary kidney   . Congestive heart failure ([place])   . End stage renal disease  on dialysis [facility])    fistula left arm, dialysis on Tues, Thurs, Sat.  . Gout    h/o: many in Rt big toe and Rt Knee, never had aspirartion diagnosis  . High cholesterol   . Hyperparathyroidism ([place])   . 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 Nonischemic cardiomyopathy (H[facility])    EF 20%(9/06) with most recent EF 50%(3/08), valves normal per 2D echo (3/08), normal doppler and color flow study(3/08) s/p endocmyocardial  biopsy: mild myocyte hypertrophy no myocyte damage or inflammation  . Physical debility   . 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 Seizure disorder ([place])    complex- partial, h/o sz disorder as a child  . Seizures ([place])    last sz 07/05/12 grand mal  . Severe protein-calorie malnutrition ([place])   . Thyroid disorder     PAST SURGICAL HISTORY: Past Surgical History:  Procedure Laterality Date  .  HERNIA REPAIR    . KIDNEY SURGERY    . KIDNEY TRANSPLANT  08/14/12  . NEPHRECTOMY TRANSPLANTED ORGAN  10/14/12  . TONSILLECTOMY      FAMILY HISTORY: Family History  Problem Relation Age of Onset  . Osteoarthritis Mother   . Hypertension Father   . Muscular dystrophy Brother   . Heart disease Child        one 70 y/o son has MVP    SOCIAL HISTORY:  Social History   Social History  . Marital status: Married    Spouse name: Lattie Haw  . Number of children: 1  . Years of education: HS   Occupational History  . Currently unemployed Disabled    used to [employer] as Scientist, clinical (histocompatibility and immunogenetics) in office in [place]  . Smoking status: Never Smoker  . Smokeless tobacco: Never Used  . Alcohol use No     Comment: social drinker  . Drug use: No  . Sexual activity: Yes    Birth control/ protection: None   Other Topics Concern  . Not on file   Social History Narrative   Moved from [place] on 08/12/06.   Lives with parents in [place].   Has been married for 58yrs.                   PHYSICAL EXAM  Vitals:   09/17/16 0855  BP: 129/80  Pulse: 81  Weight: 157 lb  (71.2 kg)   Body mass index is 27.81 kg/m.  No exam data present  Wt Readings from Last 3 Encounters:  09/17/16 157 lb (71.2 kg)  08/17/15 163 lb 9.6 oz (74.2 kg)  08/16/14 161 lb 9.6 oz (73.3 kg)   No flowsheet data found.  GENERAL EXAM: - Patient is in no distress; well developed, nourished and groomed; neck is supple  CARDIOVASCULAR: - Regular rate and rhythm, no murmurs, no carotid bruits - LEFT FOREARM FISTULA  NEUROLOGIC: MENTAL STATUS: awake, alert, language fluent, comprehension intact, naming intact, fund of knowledge appropriate CRANIAL NERVE: no papilledema on fundoscopic exam, pupils PINPOINT and reactive to light, visual fields full to confrontation, extraocular muscles intact, no nystagmus, facial sensation and strength symmetric, hearing intact, palate elevates symmetrically, uvula midline, shoulder shrug symmetric, tongue midline. MOTOR: normal bulk and tone, full strength in the BUE, BLE; MILD POSTURAL TREMOR SENSORY: normal and symmetric to light touch COORDINATION: finger-nose-finger, fine finger movements --> MILD INTENTION TREMOR IN LUE > RUE REFLEXES: deep tendon reflexes present and symmetric GAIT/STATION: narrow based gait     DIAGNOSTIC DATA (LABS, IMAGING, TESTING) - I reviewed patient records, labs, notes, testing and imaging myself where available.  Lab Results  Component Value Date   WBC 7.2 01/12/2012   HGB 13.9 07/05/2012   HCT 41.0 07/05/2012   MCV 99.0 01/12/2012   PLT 217 01/12/2012      Component Value Date/Time   NA 137 07/05/2012 2051   NA 147 (H) 09/09/2008 1450   K 3.6 07/05/2012 2051   K 5.0 (H) 09/09/2008 1450   CL 97 07/05/2012 2051   CL 111 (H) 09/09/2008 1450   CO2 29 01/13/2012 0950   CO2 26 09/09/2008 1450   GLUCOSE 81 07/05/2012 2051   GLUCOSE 106 09/09/2008 1450   BUN 10 07/05/2012 2051   BUN 51 (H) 09/09/2008 1450   CREATININE 3.20 (H) 07/05/2012 2051   CREATININE 4.7 (H) 09/09/2008 1450   CALCIUM 9.3  01/13/2012 0950   CALCIUM 10.8 (H) 09/09/2008  1450   PROT 4.7 (L) 09/16/2012 1453   PROT 6.4 09/09/2008 1450   ALBUMIN 3.2 (L) 09/16/2012 1453   AST 27 09/16/2012 1453   AST 24 09/09/2008 1450   ALT 45 (H) 09/16/2012 1453   ALT 21 09/09/2008 1450   ALKPHOS 68 09/16/2012 1453   ALKPHOS 38 09/09/2008 1450   BILITOT 0.3 09/16/2012 1453   BILITOT 0.70 09/09/2008 1450   GFRNONAA 13 (L) 01/13/2012 0950   GFRAA 15 (L) 01/13/2012 0950   Lab Results  Component Value Date   CHOL 171 08/17/2008   HDL 41 08/17/2008   LDLCALC 107 (H) 08/17/2008   TRIG 115 08/17/2008   CHOLHDL 4.2 Ratio 08/17/2008   No results found for: HGBA1C No results found for: VITAMINB12 Lab Results  Component Value Date   TSH 2.046 01/13/2012    06/20/06 MRI brain - slightly smaller right hippocamus than left; 2 small curvilinear areas of T1 and FLAIR signal in the right posterior parietal gray matter, 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 Suboptimal study due to motion. Endotracheal tube is partially visualized.    ASSESSMENT AND PLAN  56 y.o. 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   S/p kidney transplant; transplant failure, now ESRD on HD (Tu, Thurs, Sat). 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 Last seizure 07/05/12.    Dx:  Seizure disorder ([place])  ESRD on dialysis ([place])    PLAN: I spent 15 minutes of face to face time with patient. Greater than  50% of time was spent in counseling and coordination of care with patient. In summary we discussed:   SEIZURE DISORDER (established problem, stable) - continue levetiracetam 500mg  twice a day, lamotrigine 25mg  twice a day, divalproex 250mg  at bedtime - annual CBC, CMP per PCP or nephrology  Meds ordered this encounter  Medications  . levETIRAcetam (KEPPRA) 500 MG tablet    Sig: Take 1 tablet (500 mg total) by mouth 2 (two) times daily.    Dispense:  180 tablet    Refill:  4  . lamoTRIgine (LAMICTAL) 25 MG tablet    Sig: Take 1 tablet (25 mg total) by mouth 2 (two) times daily.    Dispense:  180 tablet    Refill:  4  . divalproex (DEPAKOTE ER) 250 MG 24 hr tablet    Sig: Take 1 tablet (250 mg total) by mouth daily.    Dispense:  90 tablet    Refill:  4   Return in about 1 year (around 09/17/2017).    Penni Bombard, MD 03/28/7251, 6:64 AM Certified in Neurology, Neurophysiology and Neuroimaging  [facility] [address], [place], [place] [address] [phone]

## 2017-08-20 ENCOUNTER — Encounter (HOSPITAL_COMMUNITY): Payer: Self-pay | Admitting: Emergency Medicine

## 2017-08-20 ENCOUNTER — Ambulatory Visit (HOSPITAL_COMMUNITY)
Admission: EM | Admit: 2017-08-20 | Discharge: 2017-08-20 | Disposition: A | Discharge status: Home / Self Care | Payer: Medicare Other | Attending: Internal Medicine | Admitting: Internal Medicine

## 2017-08-20 DIAGNOSIS — H00011 Hordeolum externum right upper eyelid: Secondary | ICD-10-CM | POA: Diagnosis not present

## 2017-08-20 MED ORDER — SULFACETAMIDE SODIUM 10 % OP SOLN: 1.0000 [drp] | OPHTHALMIC | 0 refills | Status: AC

## 2017-08-20 NOTE — ED Provider Notes (Signed)
Peter Sloan    CSN: [id number] Arrival date & time: 08/20/17  1819     History   Chief Complaint Chief Complaint  Patient presents with  . Eye Pain    HPI [name] is a 57 y.o. male.   He presents today with pain, redness, swelling to the medial aspect of the right upper lid, with some discharge.  This is been going on for couple of days.  No real change in vision.  He has been doing eyelid scrubs at home, without sufficient relief.    HPI  Past Medical History:  Diagnosis Date  . Atrial fibrillation ([place])   . Chronic renal insufficiency    2/2 ischemic ATN, GFR 15-17 ml/min, baseline Cr: 4-4.5, never had a renal biopsy, s/p AV fistula placement in 2007, f/b nephrologist, Dr. Dorothe Pea, [place]  . Congenital solitary kidney   . Congestive heart failure ([place])   . End stage renal disease on dialysis [facility])    fistula left arm, dialysis on Tues, Thurs, Sat.  . Gout    h/o: many in Rt big toe and Rt Knee, never had aspirartion diagnosis  . High cholesterol   . Hyperparathyroidism ([place])   . 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 Nonischemic cardiomyopathy (HCC)    EF 20%(9/06) with most recent EF 50%(3/08), valves normal per 2D echo (3/08), normal doppler and color flow study(3/08) s/p endocmyocardial  biopsy: mild myocyte hypertrophy no myocyte damage or inflammation  . Physical debility   . 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 Seizure disorder ([place])    complex- partial, h/o sz disorder as a child  . Seizures ([place])    last sz 07/05/12 grand mal  . Severe protein-calorie malnutrition ([place])   . Thyroid disorder     Patient Active Problem List   Diagnosis Date Noted  . Postoperative anemia due to acute blood loss 11/18/2012  . End stage renal disease on dialysis ([place])   . 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 Severe protein-calorie malnutrition ([place])   . Atrial fibrillation ([place])   . Physical debility   . 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 Seizure disorder ([place]) 07/28/2012  . Hypokalemia 01/12/2012  . Hyperlipidemia 01/12/2012  . THROMBOCYTOPENIA 08/17/2008  . GOUT 09/18/2006  . CARDIOMYOPATHY 09/18/2006  . RENAL INSUFFICIENCY, CHRONIC 09/18/2006  . SEIZURE DISORDER 09/18/2006    Past Surgical History:  Procedure Laterality Date  . HERNIA REPAIR    . KIDNEY SURGERY    . KIDNEY TRANSPLANT  08/14/12  . NEPHRECTOMY TRANSPLANTED ORGAN  10/14/12  . TONSILLECTOMY         Home Medications    Prior to Admission medications   Medication Sig Start Date End Date Taking? Authorizing Provider  allopurinol (ZYLOPRIM) 300 MG tablet Take 300 mg by mouth daily. 09/29/12   [provider]  amiodarone (PACERONE) 200 MG tablet Take 200 mg by mouth daily.    [provider]  Ascorbic Acid (VITAMIN C) 1000 MG tablet Take 1,000 mg by mouth daily.    [provider]  aspirin 81 MG tablet Take 81 mg by mouth every morning.     [provider]  b complex-vitamin c-folic acid (NEPHRO-VITE) 0.8 MG TABS tablet Take 0.8 mg by mouth daily.     [provider]  divalproex (DEPAKOTE ER) 250 MG 24 hr tablet Take 1 tablet (250 mg total) by mouth daily. 09/17/16   Penumalli, Earlean Polka, MD  lamoTRIgine (  LAMICTAL) 25 MG tablet Take 1 tablet (25 mg total) by mouth 2 (two) times daily. 09/17/16   Penumalli, Earlean Polka, MD  levETIRAcetam (KEPPRA) 500 MG tablet Take 1 tablet (500 mg total) by mouth 2 (two) times daily. 09/17/16   Penumalli, Earlean Polka, MD  Omega-3 Fatty Acids (FISH OIL) 1000 MG CAPS Take by mouth.    [provider]  SENSIPAR 60 MG tablet Take 60 mg by mouth daily. 07/21/15   [provider]  sulfacetamide (BLEPH-10) 10 % ophthalmic solution Place 1-2 drops into the right eye every 2 (two) hours while awake for 5 days. 08/20/17 08/25/17  Wynona Luna, MD  UNABLE TO FIND Med Name: Regan Lemming, phosphate binder    [provider]    Family History Family History    Problem Relation Age of Onset  . Osteoarthritis Mother   . Hypertension Father   . Muscular dystrophy Brother   . Heart disease Child        one 48 y/o son has MVP    Social History Social History   Tobacco Use  . Smoking status: Never Smoker  . Smokeless tobacco: Never Used  Substance Use Topics  . Alcohol use: No    Comment: social drinker  . Drug use: No     Allergies   Diphenhydramine hcl   Review of Systems Review of Systems  All other systems reviewed and are negative.    Physical Exam Triage Vital Signs ED Triage Vitals [08/20/17 1845]  Enc Vitals Group     BP (!) 141/94     Pulse Rate 93     Resp 18     Temp 98.3 F (36.8 C)     Temp Source Oral     SpO2 97 %     Weight      Height      Pain Score      Pain Loc    Updated Vital Signs BP (!) 141/94 (BP Location: Right Arm)   Pulse 93   Temp 98.3 F (36.8 C) (Oral)   Resp 18   SpO2 97%  Physical Exam  Constitutional: He is oriented to person, place, and time. No distress.  Alert, nicely groomed  HENT:  Head: Atraumatic.  Eyes:  Conjugate gaze, no conjunctival injection appreciated.  The right upper lid is mildly puffy with some erythema and focal swelling at the medial aspect.  This is mildly tender to palpation, and palpation produces discharge at the medial canthus.  Neck: Neck supple.  Cardiovascular: Normal rate.  Pulmonary/Chest: No respiratory distress.  Lungs clear, symmetric breath sounds  Abdominal: He exhibits no distension.  Musculoskeletal: Normal range of motion.  Neurological: He is alert and oriented to person, place, and time.  Skin: Skin is warm and dry.  No cyanosis  Nursing note and vitals reviewed.    UC Treatments / Results  Labs (all labs ordered are listed, but only abnormal results are displayed) Labs Reviewed - No data to display  EKG None  Radiology No results found.  Procedures Procedures (including critical care time)  Medications Ordered in  UC Medications - No data to display  Final Clinical Impressions(s) / UC Diagnoses   Final diagnoses:  Hordeolum externum of right upper eyelid     Discharge Instructions     Anticipate gradual improvement in irritation and swelling from stye over the next several days.  Prescription for eyedrops was sent to the pharmacy.  Recheck or follow-up with your primary  care provider if symptoms are not improving as expected.   ED Prescriptions    Medication Sig Dispense Auth. Provider   sulfacetamide (BLEPH-10) 10 % ophthalmic solution Place 1-2 drops into the right eye every 2 (two) hours while awake for 5 days. 4.5 mL Wynona Luna, MD       Wynona Luna, MD 08/24/17 [phone]

## 2017-08-20 NOTE — Discharge Instructions (Addendum)
Anticipate gradual improvement in irritation and swelling from stye over the next several days.  Prescription for eyedrops was sent to the pharmacy.  Recheck or follow-up with your primary care provider if symptoms are not improving as expected.

## 2017-08-20 NOTE — ED Triage Notes (Signed)
Pt here for right eye pain and swelling to eye lid; pt dialysis pt with last treatment today

## 2017-09-18 ENCOUNTER — Ambulatory Visit (INDEPENDENT_AMBULATORY_CARE_PROVIDER_SITE_OTHER): Payer: Medicare Other | Admitting: Diagnostic Neuroimaging

## 2017-09-18 ENCOUNTER — Encounter: Payer: Self-pay | Admitting: Diagnostic Neuroimaging

## 2017-09-18 ENCOUNTER — Other Ambulatory Visit: Payer: Self-pay

## 2017-09-18 ENCOUNTER — Emergency Department (HOSPITAL_COMMUNITY)
Admission: EM | Admit: 2017-09-18 | Discharge: 2017-09-18 | Disposition: A | Discharge status: Home / Self Care | Payer: Medicare Other | Attending: Emergency Medicine | Admitting: Emergency Medicine

## 2017-09-18 ENCOUNTER — Encounter (HOSPITAL_COMMUNITY): Payer: Self-pay | Admitting: Emergency Medicine

## 2017-09-18 VITALS — BP 112/75 | HR 97 | Ht 63.0 in | Wt 157.2 lb

## 2017-09-18 DIAGNOSIS — Z940 Kidney transplant status: Secondary | ICD-10-CM | POA: Insufficient documentation

## 2017-09-18 DIAGNOSIS — T82838A Hemorrhage of vascular prosthetic devices, implants and grafts, initial encounter: Secondary | ICD-10-CM | POA: Diagnosis not present

## 2017-09-18 DIAGNOSIS — Z992 Dependence on renal dialysis: Secondary | ICD-10-CM | POA: Insufficient documentation

## 2017-09-18 DIAGNOSIS — I509 Heart failure, unspecified: Secondary | ICD-10-CM | POA: Insufficient documentation

## 2017-09-18 DIAGNOSIS — Y929 Unspecified place or not applicable: Secondary | ICD-10-CM | POA: Diagnosis not present

## 2017-09-18 DIAGNOSIS — Y658 Other specified misadventures during surgical and medical care: Secondary | ICD-10-CM | POA: Insufficient documentation

## 2017-09-18 DIAGNOSIS — S51802A Unspecified open wound of left forearm, initial encounter: Secondary | ICD-10-CM | POA: Diagnosis present

## 2017-09-18 DIAGNOSIS — N186 End stage renal disease: Secondary | ICD-10-CM | POA: Insufficient documentation

## 2017-09-18 DIAGNOSIS — Q6101 Congenital single renal cyst: Secondary | ICD-10-CM | POA: Diagnosis not present

## 2017-09-18 DIAGNOSIS — Y9389 Activity, other specified: Secondary | ICD-10-CM | POA: Diagnosis not present

## 2017-09-18 DIAGNOSIS — G40909 Epilepsy, unspecified, not intractable, without status epilepticus: Secondary | ICD-10-CM | POA: Diagnosis not present

## 2017-09-18 DIAGNOSIS — Y999 Unspecified external cause status: Secondary | ICD-10-CM | POA: Insufficient documentation

## 2017-09-18 DIAGNOSIS — Z79899 Other long term (current) drug therapy: Secondary | ICD-10-CM | POA: Diagnosis not present

## 2017-09-18 DIAGNOSIS — I132 Hypertensive heart and chronic kidney disease with heart failure and with stage 5 chronic kidney disease, or end stage renal disease: Secondary | ICD-10-CM | POA: Insufficient documentation

## 2017-09-18 MED ORDER — LEVETIRACETAM 500 MG PO TABS: 500.0000 mg | ORAL_TABLET | Freq: Two times a day (BID) | ORAL | 4 refills | Status: DC

## 2017-09-18 MED ORDER — LAMOTRIGINE 25 MG PO TABS: 25.0000 mg | ORAL_TABLET | Freq: Two times a day (BID) | ORAL | 4 refills | Status: DC

## 2017-09-18 MED ORDER — LIDOCAINE-EPINEPHRINE (PF) 2 %-1:200000 IJ SOLN
10.0000 mL | Freq: Once | INTRAMUSCULAR | Status: AC
  Administered 2017-09-18: 10 mL
  Filled 2017-09-18: qty 20

## 2017-09-18 MED ORDER — DIVALPROEX SODIUM ER 250 MG PO TB24: 250.0000 mg | ORAL_TABLET | Freq: Every day | ORAL | 4 refills | Status: DC

## 2017-09-18 NOTE — ED Provider Notes (Signed)
Patient placed in Quick Look pathway, seen and evaluated   Chief Complaint: Bleeding from fistula  HPI:   Dialysis yesterday.Oozing since  ROS: bleeding  (one)  Physical Exam:   Gen: No distress  Neuro: Awake and Alert  Skin: Warm    Focused Exam: oozinf from superior fisutla   Initiation of care has begun. The patient has been counseled on the process, plan, and necessity for staying for the completion/evaluation, and the remainder of the medical screening examination    Margarita Mail, PA-C 09/18/17 1716    Mesner, Corene Cornea, MD 09/19/17 [phone]

## 2017-09-18 NOTE — Discharge Instructions (Signed)
They can take the suture out dialysis on Saturday.  Continue home care as directed by dialysis staff.  Please return to emergency department if you develop any new or worsening symptoms, or if bleeding returns.

## 2017-09-18 NOTE — Progress Notes (Signed)
[facility]  PATIENT: Peter Sloan DOB: 1960-04-03  REFERRING CLINICIAN:  HISTORY FROM: patient REASON FOR VISIT: follow up   HISTORICAL  CHIEF COMPLAINT:  Chief Complaint  Patient presents with  . Seizures    rm 7, wife-[name], "no seizure activity"  . Follow-up    1 year    HISTORY OF PRESENT ILLNESS:   UPDATE (09/18/17, VRP): Since last visit, doing well. Tolerating meds. No alleviating or aggravating factors. No seizures.  UPDATE 09/17/16: Since last visit, tolerating meds. No seizures. Notes some changes in urination related to his keppra medication.   UPDATE 08/17/15: Since last visit, no seizures. Tolerating medications.   UPDATE 08/16/14: Since last visit, doing well. No seizures. Tolerating meds.   UPDATE 02/10/14: Since last visit, doing well. No tremors. Hair is growing back thicker. No seizures. Tolerating meds.  UPDATE 08/07/13 (LL): Since last visit no further seizures, doing well. No noted tremors. On depakote ER 250mg  daily, lamotrigine 25mg  BID, and levetiracetam 250mg  BID. Since Depakote dose has been reduced his hair has returned. iPTH level rose from 126 to 927 since Dec 2014. No new complaints.   UPDATE 06/05/13 (LL): Since last visit, no further seizures. Tremors are gone. On depakote ER 250mg  BID, lamotrigine 25mg  BID, and levetiracetam 250mg  BID. Exercising daily at the [facility], feels good.   UPDATE 01/05/13 (VP): Since last visit, no further seizures. Kidney transplant failed unfortunately. Tremors are better. On depakote ER 250mg  BID, lamotrigine 25mg  BID, and levetiracetam 250mg  BID.   UPDATE 09/16/12: Patient had kidney transplant on Aug 14, 2012. Hospital admission at [facility] was 10 days. Ureter stent was placed during transplant surgery which had to be replaced and prolonged the admission. Discharged with foley catheter, he could not void in 6 hours so foley was replaced. He currently has foley. Kidney is functioning, no more HD needed.  He is on week 3 of 6 weeks of in-home therapy. Ambulates with walker but needed wheelchair for long distances. Has had 9 falls in 12 days. Appears very deconditioned. Tremors are worse since surgery, affecting writing, feeding self. Currently on VPA 1000mg  BID and LTG 25mg  BID   UPDATE 07/28/12: Since last visit, had breakthrough sz after dialysis session (told me that double volume was removed inadvertantly). Since then I re-adjusted his meds to VPA 1000mg  BID and LTG 50mg  BID. Now in review of records, I think he may still be on 25mg  BID, but he is not sure.   UPDATE 05/28/12: Since last visit, no sz. Tolerating meds. No new events.   UPDATE 02/20/12: Since last visit, now on dialysis. Also, had breakthrough sz on 01/11/12 (admitted x 1 day). Now on incr VPA 750mg  BID and continues on LTG 25mg  BID. No triggering factors for breakthrough sz.   UPDATE 06/26/11: Last convulsive seizure was in February 2012. Most recent abnormal spell was 05/05/2011, following a stress test. He had been laying down at home, felt a abnormal sensation in the left occipital region with a pop sensation, followed by a rush sensation in his face down to his stomach with a sour stomach sensation. He thinks this may have been a petit mall seizure. He was fully aware and conscious during the episode. Episode was similar but somewhat different than his prior episodes. In response to this event he was started on low dose Lamictal 25 mg twice a day. Since that time he has had no further events.   PRIOR HPI: 57 -year-old, right handed, Caucasian male who returns today  for followup for history of longstanding seizure disorder. He has a single kidney with renal dysfunction. 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 He has never required dialysis however he is on the transplant list at [facility]. His last major seizure was 05/15/10 and seen in the ER. No loss of B/B, did not bite his tongue. He has had a total of 2 seizures over the  last few years. He has had problems with thrombocytopenia in the past . His Depakote dose was reduced and he was seen by a hematologist. The platelet count has returned to normal. He has history of HTN, atrial fib, hypercholesterolemia , and renal failure. Recent labs reviewed from Dr. Deterding's office. Lamictal added at last visit, low dose. No further seizure activity. See ROS.    REVIEW OF SYSTEMS: Full 14 system review of systems performed and negative except FGH:WEXH bruising env allergies constipation.    ALLERGIES: Allergies  Allergen Reactions  . Diphenhydramine Hcl Palpitations    HOME MEDICATIONS: Outpatient Medications Prior to Visit  Medication Sig Dispense Refill  . allopurinol (ZYLOPRIM) 300 MG tablet Take 300 mg by mouth daily.    Marland Kitchen amiodarone (PACERONE) 200 MG tablet Take 200 mg by mouth daily.    . Ascorbic Acid (VITAMIN C) 1000 MG tablet Take 1,000 mg by mouth daily.    Marland Kitchen aspirin 81 MG tablet Take 81 mg by mouth every morning.     Marland Kitchen b complex-vitamin c-folic acid (NEPHRO-VITE) 0.8 MG TABS tablet Take 0.8 mg by mouth daily.     . divalproex (DEPAKOTE ER) 250 MG 24 hr tablet Take 1 tablet (250 mg total) by mouth daily. 90 tablet 4  . lamoTRIgine (LAMICTAL) 25 MG tablet Take 1 tablet (25 mg total) by mouth 2 (two) times daily. 180 tablet 4  . levETIRAcetam (KEPPRA) 500 MG tablet Take 1 tablet (500 mg total) by mouth 2 (two) times daily. 180 tablet 4  . Omega-3 Fatty Acids (FISH OIL) 1000 MG CAPS Take by mouth.    . SENSIPAR 60 MG tablet Take 60 mg by mouth daily.  3  . UNABLE TO FIND Med Name: Regan Lemming, phosphate binder     No facility-administered medications prior to visit.     PAST MEDICAL HISTORY: Past Medical History:  Diagnosis Date  . Atrial fibrillation ([place])   . Chronic renal insufficiency    2/2 ischemic ATN, GFR 15-17 ml/min, baseline Cr: 4-4.5, never had a renal biopsy, s/p AV fistula placement in 2007, f/b nephrologist, Dr. Dorothe Pea, [place]  .  Congenital solitary kidney   . Congestive heart failure ([place])   . End stage renal disease on dialysis [facility])    fistula left arm, dialysis on Tues, Thurs, Sat.  . Gout    h/o: many in Rt big toe and Rt Knee, never had aspirartion diagnosis  . High cholesterol   . Hyperparathyroidism ([place])   . 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 Nonischemic cardiomyopathy (HCC)    EF 20%(9/06) with most recent EF 50%(3/08), valves normal per 2D echo (3/08), normal doppler and color flow study(3/08) s/p endocmyocardial  biopsy: mild myocyte hypertrophy no myocyte damage or inflammation  . Physical debility   . 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 Seizure disorder ([place])    complex- partial, h/o sz disorder as a child  . Seizures ([place])    last sz 07/05/12 grand mal  . Severe protein-calorie malnutrition ([place])   . Thyroid disorder  PAST SURGICAL HISTORY: Past Surgical History:  Procedure Laterality Date  . HERNIA REPAIR    . KIDNEY SURGERY    . KIDNEY TRANSPLANT  08/14/12  . NEPHRECTOMY TRANSPLANTED ORGAN  10/14/12  . TONSILLECTOMY      FAMILY HISTORY: Family History  Problem Relation Age of Onset  . Osteoarthritis Mother   . Hypertension Father   . Muscular dystrophy Brother   . Heart disease Child        one 74 y/o son has MVP    SOCIAL HISTORY:  Social History   Socioeconomic History  . Marital status: Married    Spouse name: Lattie Haw  . Number of children: 1  . Years of education: HS  . Highest education level: Not on file  Occupational History  . Occupation: Currently unemployed    Employer: DISABLED    Comment: used to [employer] as Scientist, clinical (histocompatibility and immunogenetics) in office in [place]  . Financial resource strain: Not on file  . Food insecurity:    Worry: Not on file    Inability: Not on file  . Transportation needs:    Medical: Not on file    Non-medical: Not on file  Tobacco Use  . Smoking status: Never Smoker  . Smokeless tobacco: Never Used  Substance and Sexual Activity  .  Alcohol use: No    Comment: social drinker  . Drug use: No  . Sexual activity: Yes    Birth control/protection: None  Lifestyle  . Physical activity:    Days per week: Not on file    Minutes per session: Not on file  . Stress: Not on file  Relationships  . Social connections:    Talks on phone: Not on file    Gets together: Not on file    Attends religious service: Not on file    Active member of club or organization: Not on file    Attends meetings of clubs or organizations: Not on file    Relationship status: Not on file  . Intimate partner violence:    Fear of current or ex partner: Not on file    Emotionally abused: Not on file    Physically abused: Not on file    Forced sexual activity: Not on file  Other Topics Concern  . Not on file  Social History Narrative   Moved from [place] on 08/12/06.   Lives with parents in [place].   Has been married for 89yrs.                   PHYSICAL EXAM  Vitals:   09/18/17 1352  BP: 112/75  Pulse: 97  Weight: 157 lb 3.2 oz (71.3 kg)  Height: 5\' 3"  (1.6 m)   Body mass index is 27.85 kg/m.  No exam data present  Wt Readings from Last 3 Encounters:  09/18/17 157 lb 3.2 oz (71.3 kg)  09/17/16 157 lb (71.2 kg)  08/17/15 163 lb 9.6 oz (74.2 kg)   No flowsheet data found.  GENERAL EXAM: - Patient is in no distress; well developed, nourished and groomed; neck is supple  CARDIOVASCULAR: - Regular rate and rhythm, no murmurs, no carotid bruits - LEFT FOREARM FISTULA  NEUROLOGIC: MENTAL STATUS: awake, alert, language fluent, comprehension intact, naming intact, fund of knowledge appropriate CRANIAL NERVE: no papilledema on fundoscopic exam, pupils PINPOINT and reactive to light, visual fields full to confrontation, extraocular muscles intact, no nystagmus, facial sensation and strength symmetric, hearing intact, palate elevates symmetrically, uvula midline, shoulder  shrug symmetric, tongue midline. MOTOR: normal bulk and  tone, full strength in the BUE, BLE; MILD POSTURAL TREMOR SENSORY: normal and symmetric to light touch COORDINATION: finger-nose-finger, fine finger movements --> MILD INTENTION TREMOR IN LUE > RUE REFLEXES: deep tendon reflexes present and symmetric GAIT/STATION: narrow based gait     DIAGNOSTIC DATA (LABS, IMAGING, TESTING) - I reviewed patient records, labs, notes, testing and imaging myself where available.  Lab Results  Component Value Date   WBC 7.2 01/12/2012   HGB 13.9 07/05/2012   HCT 41.0 07/05/2012   MCV 99.0 01/12/2012   PLT 217 01/12/2012      Component Value Date/Time   NA 137 07/05/2012 2051   NA 147 (H) 09/09/2008 1450   K 3.6 07/05/2012 2051   K 5.0 (H) 09/09/2008 1450   CL 97 07/05/2012 2051   CL 111 (H) 09/09/2008 1450   CO2 29 01/13/2012 0950   CO2 26 09/09/2008 1450   GLUCOSE 81 07/05/2012 2051   GLUCOSE 106 09/09/2008 1450   BUN 10 07/05/2012 2051   BUN 51 (H) 09/09/2008 1450   CREATININE 3.20 (H) 07/05/2012 2051   CREATININE 4.7 (H) 09/09/2008 1450   CALCIUM 9.3 01/13/2012 0950   CALCIUM 10.8 (H) 09/09/2008 1450   PROT 4.7 (L) 09/16/2012 1453   PROT 6.4 09/09/2008 1450   ALBUMIN 3.2 (L) 09/16/2012 1453   AST 27 09/16/2012 1453   AST 24 09/09/2008 1450   ALT 45 (H) 09/16/2012 1453   ALT 21 09/09/2008 1450   ALKPHOS 68 09/16/2012 1453   ALKPHOS 38 09/09/2008 1450   BILITOT 0.3 09/16/2012 1453   BILITOT 0.70 09/09/2008 1450   GFRNONAA 13 (L) 01/13/2012 0950   GFRAA 15 (L) 01/13/2012 0950   Lab Results  Component Value Date   CHOL 171 08/17/2008   HDL 41 08/17/2008   LDLCALC 107 (H) 08/17/2008   TRIG 115 08/17/2008   CHOLHDL 4.2 Ratio 08/17/2008   No results found for: HGBA1C No results found for: VITAMINB12 Lab Results  Component Value Date   TSH 2.046 01/13/2012    06/20/06 MRI brain - slightly smaller right hippocamus than left; 2 small curvilinear areas of T1 and FLAIR signal in the right posterior parietal gray matter, 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 Suboptimal study due to motion. Endotracheal tube is partially visualized.    ASSESSMENT AND PLAN  56 y.o. 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   S/p kidney transplant; transplant failure, now ESRD on HD (Tu, Thurs, Sat). 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 Last seizure 07/05/12.    Dx:  Seizure disorder ([place])  ESRD on dialysis [facility])    PLAN:  SEIZURE DISORDER (established problem, stable) - continue levetiracetam 500mg  twice a day, lamotrigine 25mg  twice a day, divalproex 250mg  at bedtime - annual CBC, CMP per PCP or nephrology  Meds ordered this encounter  Medications  . levETIRAcetam (KEPPRA) 500 MG tablet    Sig: Take 1 tablet (500 mg total) by mouth 2 (two) times daily.    Dispense:  180 tablet  Refill:  4  . lamoTRIgine (LAMICTAL) 25 MG tablet    Sig: Take 1 tablet (25 mg total) by mouth 2 (two) times daily.    Dispense:  180 tablet    Refill:  4  . divalproex (DEPAKOTE ER) 250 MG 24 hr tablet    Sig: Take 1 tablet (250 mg total) by mouth daily.    Dispense:  90 tablet    Refill:  4   Return in about 1 year (around 09/19/2018) for with NP.    Penni Bombard, MD 9/86/1483, 0:73 PM Certified in Neurology, Neurophysiology and [facility] [address], [place], [place] [address] [phone]

## 2017-09-18 NOTE — ED Triage Notes (Signed)
Patient to ED c/o continued oozing from fistula site in L arm - states it has been bleeding since yesterday after dialysis. Patient's home bandage removed - oozing noted. Pressure bandage applied in triage.

## 2017-09-18 NOTE — ED Provider Notes (Signed)
[facility] EMERGENCY DEPARTMENT Provider Note   CSN: [id number] Arrival date & time: 09/18/17  1629     History   Chief Complaint Chief Complaint  Patient presents with  . Bleeding Fistula    HPI Peter Sloan is a 57 y.o. male with history of atrial fibrillation, ESRD on dialysis Tuesday, Thursday, Saturday presents with bleeding fistula since dialysis yesterday.  He reports it has been oozing since despite pressure dressings at home.  He denies any pain.  He denies any chest pain, shortness of breath, lightheadedness.  HPI  Past Medical History:  Diagnosis Date  . Atrial fibrillation ([place])   . Chronic renal insufficiency    2/2 ischemic ATN, GFR 15-17 ml/min, baseline Cr: 4-4.5, never had a renal biopsy, s/p AV fistula placement in 2007, f/b nephrologist, Dr. Dorothe Pea, [place]  . Congenital solitary kidney   . Congestive heart failure ([place])   . End stage renal disease on dialysis [facility])    fistula left arm, dialysis on Tues, Thurs, Sat.  . Gout    h/o: many in Rt big toe and Rt Knee, never had aspirartion diagnosis  . High cholesterol   . Hyperparathyroidism ([place])   . 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 Nonischemic cardiomyopathy (HCC)    EF 20%(9/06) with most recent EF 50%(3/08), valves normal per 2D echo (3/08), normal doppler and color flow study(3/08) s/p endocmyocardial  biopsy: mild myocyte hypertrophy no myocyte damage or inflammation  . Physical debility   . 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 Seizure disorder ([place])    complex- partial, h/o sz disorder as a child  . Seizures ([place])    last sz 07/05/12 grand mal  . Severe protein-calorie malnutrition ([place])   . Thyroid disorder     Patient Active Problem List   Diagnosis Date Noted  . Postoperative anemia due to acute blood loss 11/18/2012  . End stage renal disease on dialysis ([place])   . 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 Severe protein-calorie malnutrition ([place])   .  Atrial fibrillation ([place])   . Physical debility   . 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 Seizure disorder ([place]) 07/28/2012  . Hypokalemia 01/12/2012  . Hyperlipidemia 01/12/2012  . THROMBOCYTOPENIA 08/17/2008  . GOUT 09/18/2006  . CARDIOMYOPATHY 09/18/2006  . RENAL INSUFFICIENCY, CHRONIC 09/18/2006  . SEIZURE DISORDER 09/18/2006    Past Surgical History:  Procedure Laterality Date  . HERNIA REPAIR    . KIDNEY SURGERY    . KIDNEY TRANSPLANT  08/14/12  . NEPHRECTOMY TRANSPLANTED ORGAN  10/14/12  . TONSILLECTOMY          Home Medications    Prior to Admission medications   Medication Sig Start Date End Date Taking? Authorizing Provider  allopurinol (ZYLOPRIM) 300 MG tablet Take 300 mg by mouth daily. 09/29/12   [provider]  amiodarone (PACERONE) 200 MG tablet Take 200 mg by mouth daily.    [provider]  Ascorbic Acid (VITAMIN C) 1000 MG tablet Take 1,000 mg by mouth daily.    [provider]  aspirin 81 MG tablet Take 81 mg by mouth every morning.     [provider]  b complex-vitamin c-folic acid (NEPHRO-VITE) 0.8 MG TABS tablet Take 0.8 mg by mouth daily.     [provider]  divalproex (DEPAKOTE ER) 250 MG 24 hr tablet Take 1 tablet (250 mg total) by mouth daily. 09/18/17   Penumalli, Earlean Polka, MD  lamoTRIgine (  LAMICTAL) 25 MG tablet Take 1 tablet (25 mg total) by mouth 2 (two) times daily. 09/18/17   Penumalli, Earlean Polka, MD  levETIRAcetam (KEPPRA) 500 MG tablet Take 1 tablet (500 mg total) by mouth 2 (two) times daily. 09/18/17   Penumalli, Earlean Polka, MD  Omega-3 Fatty Acids (FISH OIL) 1000 MG CAPS Take by mouth.    [provider]  SENSIPAR 60 MG tablet Take 60 mg by mouth daily. 07/21/15   [provider]  UNABLE TO FIND Med Name: Regan Lemming, phosphate binder    [provider]    Family History Family History  Problem Relation Age of Onset  . Osteoarthritis Mother   . Hypertension Father   .  Muscular dystrophy Brother   . Heart disease Child        one 56 y/o son has MVP    Social History Social History   Tobacco Use  . Smoking status: Never Smoker  . Smokeless tobacco: Never Used  Substance Use Topics  . Alcohol use: No    Comment: social drinker  . Drug use: No     Allergies   Diphenhydramine hcl   Review of Systems Review of Systems  Respiratory: Negative for shortness of breath.   Cardiovascular: Negative for chest pain.  Skin: Positive for wound.  Neurological: Negative for dizziness and light-headedness.     Physical Exam Updated Vital Signs BP 134/88 (BP Location: Right Arm)   Pulse 99   Temp 98.3 F (36.8 C) (Oral)   Resp 16   SpO2 94%   Physical Exam  Constitutional: He appears well-developed and well-nourished. No distress.  HENT:  Head: Normocephalic and atraumatic.  Eyes: Pupils are equal, round, and reactive to light. Conjunctivae are normal. Right eye exhibits no discharge. Left eye exhibits no discharge. No scleral icterus.  Neck: Normal range of motion. Neck supple. No thyromegaly present.  Cardiovascular: Normal rate, regular rhythm and normal heart sounds. Exam reveals no gallop and no friction rub.  No murmur heard. Pulmonary/Chest: Effort normal and breath sounds normal. No stridor. No respiratory distress. He has no wheezes. He has no rales.  Abdominal: Soft. Bowel sounds are normal. He exhibits no distension. There is no tenderness. There is no guarding.  Musculoskeletal: He exhibits no edema.  Lymphadenopathy:    He has no cervical adenopathy.  Neurological: He is alert. Coordination normal.  Skin: Skin is warm and dry. No rash noted. He is not diaphoretic. No pallor.  Pinpoint wound of the left fistula, oozing  Psychiatric: He has a normal mood and affect.  Nursing note and vitals reviewed.    ED Treatments / Results  Labs (all labs ordered are listed, but only abnormal results are displayed) Labs Reviewed - No data  to display  EKG None  Radiology No results found.  Procedures .Marland KitchenLaceration Repair Date/Time: 09/18/2017 6:47 PM Performed by: Frederica Kuster, PA-C Authorized by: Frederica Kuster, PA-C   Consent:    Consent obtained:  Verbal   Consent given by:  Patient   Risks discussed:  Infection   Alternatives discussed:  No treatment Anesthesia (see MAR for exact dosages):    Anesthesia method:  Local infiltration   Local anesthetic:  Lidocaine 2% WITH epi Laceration details:    Location:  Shoulder/arm   Shoulder/arm location:  L lower arm   Length (cm):  0.1 Repair type:    Repair type:  Simple Pre-procedure details:    Preparation:  Patient was prepped and draped in  usual sterile fashion Exploration:    Hemostasis achieved with:  Direct pressure   Wound exploration: wound explored through full range of motion     Wound extent: no foreign bodies/material noted     Contaminated: no   Treatment:    Wound cleansed with: Chrlorprep. Skin repair:    Repair method:  Sutures   Suture size:  5-0   Wound skin closure material used: Ethilon.   Suture technique:  Horizontal mattress   Number of sutures:  2 Approximation:    Approximation:  Close Post-procedure details:    Dressing:  Bulky dressing   Patient tolerance of procedure:  Tolerated well, no immediate complications   (including critical care time)  Medications Ordered in ED Medications  lidocaine-EPINEPHrine (XYLOCAINE W/EPI) 2 %-1:200000 (PF) injection 10 mL (10 mLs Infiltration Given by Other 09/18/17 1757)     Initial Impression / Assessment and Plan / ED Course  I have reviewed the triage vital signs and the nursing notes.  Pertinent labs & imaging results that were available during my care of the patient were reviewed by me and considered in my medical decision making (see chart for details).     Patient with oozing fistula.  Repaired with horizontal mattress sutures.  Bleeding controlled.  Pressure dressing  placed.  Patient advised to have suture removed in 48 hours at dialysis.  Return precautions discussed.  Patient understands and agrees with plan.  Patient vitals stable.  Course and discharged in satisfactory condition.  Patient also evaluated by Dr. Jeneen Rinks who guided the patient's management and agrees with plan.  Final Clinical Impressions(s) / ED Diagnoses   Final diagnoses:  Bleeding due to dialysis catheter placement, initial encounter [facility])    ED Discharge Orders    None       Frederica Kuster, PA-C 09/18/17 1850    Tanna Furry, MD 09/20/17 [phone]

## 2017-09-18 NOTE — ED Notes (Signed)
Pts left forearm wrapped in koban, bleeding controlled at this time. PMS is intact.

## 2017-09-18 NOTE — ED Notes (Signed)
Alex PA at bedside

## 2017-11-26 ENCOUNTER — Ambulatory Visit (HOSPITAL_COMMUNITY)
Admission: EM | Admit: 2017-11-26 | Discharge: 2017-11-26 | Disposition: A | Discharge status: Home / Self Care | Payer: Medicare Other | Attending: Family Medicine | Admitting: Family Medicine

## 2017-11-26 ENCOUNTER — Ambulatory Visit (INDEPENDENT_AMBULATORY_CARE_PROVIDER_SITE_OTHER): Payer: Medicare Other

## 2017-11-26 ENCOUNTER — Encounter (HOSPITAL_COMMUNITY): Payer: Self-pay | Admitting: Emergency Medicine

## 2017-11-26 DIAGNOSIS — R2231 Localized swelling, mass and lump, right upper limb: Secondary | ICD-10-CM | POA: Diagnosis not present

## 2017-11-26 DIAGNOSIS — M79644 Pain in right finger(s): Secondary | ICD-10-CM | POA: Diagnosis not present

## 2017-11-26 NOTE — ED Triage Notes (Signed)
PT reports pain in right thumb. No known injury. Had gout several years ago. Full ROM

## 2017-11-26 NOTE — Discharge Instructions (Signed)
X-ray was negative for fracture or dislocation.  Continue to ice compress, and wear wrist splint during activity.  He can take Tylenol for pain if needed.  Follow-up with PCP for further evaluation if symptoms not improving.

## 2017-11-26 NOTE — ED Provider Notes (Signed)
[place]    CSN: [id number] Arrival date & time: 11/26/17  1631     History   Chief Complaint Chief Complaint  Patient presents with  . Hand Pain    HPI Peter Sloan is a 57 y.o. male.   57 year old male comes in for 3 day history of of right thumb pain. States jammed his finger 3 days ago with swelling to the first MCP joint. States able to move finger, but with pain and with decreased strength. Denies numbness/tingling. States did ice compress at dialysis which help with the swelling. Has not taken anything for pain.  Dialysis T, Thurs, Sat.      Past Medical History:  Diagnosis Date  . Atrial fibrillation ([place])   . Chronic renal insufficiency    2/2 ischemic ATN, GFR 15-17 ml/min, baseline Cr: 4-4.5, never had a renal biopsy, s/p AV fistula placement in 2007, f/b nephrologist, Dr. Dorothe Pea, [place]  . Congenital solitary kidney   . Congestive heart failure ([place])   . End stage renal disease on dialysis [facility])    fistula left arm, dialysis on Tues, Thurs, Sat.  . Gout    h/o: many in Rt big toe and Rt Knee, never had aspirartion diagnosis  . High cholesterol   . Hyperparathyroidism ([place])   . 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 Nonischemic cardiomyopathy ([facility])    EF 20%(9/06) with most recent EF 50%(3/08), valves normal per 2D echo (3/08), normal doppler and color flow study(3/08) s/p endocmyocardial  biopsy: mild myocyte hypertrophy no myocyte damage or inflammation  . Physical debility   . 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 Seizure disorder ([place])    complex- partial, h/o sz disorder as a child  . Seizures ([place])    last sz 07/05/12 grand mal  . Severe protein-calorie malnutrition ([place])   . Thyroid disorder     Patient Active Problem List   Diagnosis Date Noted  . Postoperative anemia due to acute blood loss 11/18/2012  . End stage renal disease on dialysis ([place])   . 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 Severe protein-calorie  malnutrition ([place])   . Atrial fibrillation ([place])   . Physical debility   . 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 Seizure disorder ([place]) 07/28/2012  . Hypokalemia 01/12/2012  . Hyperlipidemia 01/12/2012  . THROMBOCYTOPENIA 08/17/2008  . GOUT 09/18/2006  . CARDIOMYOPATHY 09/18/2006  . RENAL INSUFFICIENCY, CHRONIC 09/18/2006  . SEIZURE DISORDER 09/18/2006    Past Surgical History:  Procedure Laterality Date  . HERNIA REPAIR    . KIDNEY SURGERY    . KIDNEY TRANSPLANT  08/14/12  . NEPHRECTOMY TRANSPLANTED ORGAN  10/14/12  . TONSILLECTOMY         Home Medications    Prior to Admission medications   Medication Sig Start Date End Date Taking? Authorizing Provider  allopurinol (ZYLOPRIM) 300 MG tablet Take 300 mg by mouth daily. 09/29/12   [provider]  amiodarone (PACERONE) 200 MG tablet Take 200 mg by mouth daily.    [provider]  Ascorbic Acid (VITAMIN C) 1000 MG tablet Take 1,000 mg by mouth daily.    [provider]  aspirin 81 MG tablet Take 81 mg by mouth every morning.     [provider]  b complex-vitamin c-folic acid (NEPHRO-VITE) 0.8 MG TABS tablet Take 0.8 mg by mouth daily.     [provider]  divalproex (DEPAKOTE ER) 250 MG 24 hr tablet  Take 1 tablet (250 mg total) by mouth daily. 09/18/17   Penumalli, Earlean Polka, MD  lamoTRIgine (LAMICTAL) 25 MG tablet Take 1 tablet (25 mg total) by mouth 2 (two) times daily. 09/18/17   Penumalli, Earlean Polka, MD  levETIRAcetam (KEPPRA) 500 MG tablet Take 1 tablet (500 mg total) by mouth 2 (two) times daily. 09/18/17   Penumalli, Earlean Polka, MD  Omega-3 Fatty Acids (FISH OIL) 1000 MG CAPS Take by mouth.    [provider]  SENSIPAR 60 MG tablet Take 60 mg by mouth daily. 07/21/15   [provider]  UNABLE TO FIND Med Name: Regan Lemming, phosphate binder    [provider]    Family History Family History  Problem Relation Age of Onset  . Osteoarthritis Mother   .  Hypertension Father   . Muscular dystrophy Brother   . Heart disease Child        one 65 y/o son has MVP    Social History Social History   Tobacco Use  . Smoking status: Never Smoker  . Smokeless tobacco: Never Used  Substance Use Topics  . Alcohol use: No    Comment: social drinker  . Drug use: No     Allergies   Diphenhydramine hcl   Review of Systems Review of Systems  Reason unable to perform ROS: See HPI as above.     Physical Exam Triage Vital Signs ED Triage Vitals  Enc Vitals Group     BP 11/26/17 1722 (!) 154/87     Pulse Rate 11/26/17 1722 88     Resp 11/26/17 1722 16     Temp 11/26/17 1722 97.6 F (36.4 C)     Temp Source 11/26/17 1722 Oral     SpO2 11/26/17 1722 100 %     Weight 11/26/17 1723 153 lb (69.4 kg)     Height --      Head Circumference --      Peak Flow --      Pain Score 11/26/17 1723 2     Pain Loc --      Pain Edu? --      Excl. in [place]? --    No data found.  Updated Vital Signs BP (!) 154/87   Pulse 88   Temp 97.6 F (36.4 C) (Oral)   Resp 16   Wt 153 lb (69.4 kg)   SpO2 100%   BMI 27.10 kg/m   Physical Exam  Constitutional: He is oriented to person, place, and time. He appears well-developed and well-nourished. No distress.  HENT:  Head: Normocephalic and atraumatic.  Eyes: Pupils are equal, round, and reactive to light. Conjunctivae are normal.  Musculoskeletal:  Swelling along thumb of left hand. No erythema, warmth. Tenderness to palpation 1st MCP joint of left hand. Full ROM of finger and wrist. Strength decreased. Sensation intact. Radial pulse 2+, cap refill<2s  Neurological: He is alert and oriented to person, place, and time.  Skin: He is not diaphoretic.     UC Treatments / Results  Labs (all labs ordered are listed, but only abnormal results are displayed) Labs Reviewed - No data to display  EKG None  Radiology Dg Hand Complete Right  Result Date: 11/26/2017 CLINICAL DATA:  57 year old male with  injury to right wrist and hand 4 days ago. EXAM: RIGHT HAND - COMPLETE 3+ VIEW COMPARISON:  No priors. FINDINGS: There is no evidence of fracture or dislocation. There is no evidence of arthropathy or other focal bone abnormality.  Soft tissues are unremarkable. IMPRESSION: Negative. Electronically Signed   By: Vinnie Langton M.D.   On: 11/26/2017 18:33    Procedures Procedures (including critical care time)  Medications Ordered in UC Medications - No data to display  Initial Impression / Assessment and Plan / UC Course  I have reviewed the triage vital signs and the nursing notes.  Pertinent labs & imaging results that were available during my care of the patient were reviewed by me and considered in my medical decision making (see chart for details).    X-ray negative for fracture or dislocation.  Patient to continue ice compress, Tylenol for pain.  Wrist splint during activity. Return precautions given. Patient expresses understanding and agrees to plan. Final Clinical Impressions(s) / UC Diagnoses   Final diagnoses:  Thumb pain, right    ED Prescriptions    None        Ok Edwards, PA-C 11/26/17 2049

## 2018-09-18 ENCOUNTER — Telehealth: Payer: Self-pay

## 2018-09-18 NOTE — Telephone Encounter (Signed)
Unable to get in contact with the patient to convert their office appt with Amy on 09/22/2018 into a mychart video visit. I left a voicemail asking the patient to return my call. Office number was provided.    If patient calls back please convert their office visit into a mychart video visit.

## 2018-09-22 ENCOUNTER — Ambulatory Visit: Payer: Medicare Other | Admitting: Nurse Practitioner

## 2018-09-22 ENCOUNTER — Encounter: Payer: Self-pay | Admitting: Family Medicine

## 2018-09-22 ENCOUNTER — Telehealth (INDEPENDENT_AMBULATORY_CARE_PROVIDER_SITE_OTHER): Payer: Medicare Other | Admitting: Family Medicine

## 2018-09-22 DIAGNOSIS — G40909 Epilepsy, unspecified, not intractable, without status epilepticus: Secondary | ICD-10-CM

## 2018-09-22 NOTE — Progress Notes (Signed)
PATIENT: Peter Sloan DOB: [birth year]  REASON FOR VISIT: follow up HISTORY FROM: patient  Virtual Visit via Telephone Note  I connected with Peter Sloan on 09/22/18 at  2:00 PM EDT by telephone and verified that I am speaking with the correct person using two identifiers.   I discussed the limitations, risks, security and privacy concerns of performing an evaluation and management service by telephone and the availability of in person appointments. I also discussed with the patient that there may be a patient responsible charge related to this service. The patient expressed understanding and agreed to proceed.   History of Present Illness:  09/22/18 Peter Sloan is a 58 y.o. male here today for follow up for seizure. He continues levetiracetam 500mg  twice daily, lamtrogine 25mg  twice daily and divalproex 250mg  twice daily. He is doing very well without adverse effects. He continues dialysis on Tu, Th and Sa. 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 Dry weight 70.5KG.    History (copied from Dr Gladstone Lighter note on 09/18/2017)  UPDATE (09/18/17, VRP): Since last visit, doing well. Tolerating meds. No alleviating or aggravating factors. No seizures.  UPDATE 09/17/16: Since last visit, tolerating meds. No seizures. Notes some changes in urination related to his keppra medication.   UPDATE 08/17/15: Since last visit, no seizures. Tolerating medications.   UPDATE 08/16/14: Since last visit, doing well. No seizures. Tolerating meds.   UPDATE 02/10/14: Since last visit, doing well. No tremors. Hair is growing back thicker. No seizures. Tolerating meds.  UPDATE 08/07/13 (LL): Since last visit no further seizures, doing well. No noted tremors. On depakote ER 250mg  daily, lamotrigine 25mg  BID, and levetiracetam 250mg  BID. Since Depakote dose has been reduced his hair has returned. iPTH level rose from 126 to 927 since Dec 2014. No new complaints.   UPDATE 06/05/13  (LL): Since last visit, no further seizures. Tremors are gone. On depakote ER 250mg  BID, lamotrigine 25mg  BID, and levetiracetam 250mg  BID. Exercising daily at the [facility], feels good.   UPDATE 01/05/13 (VP): Since last visit, no further seizures. Kidney transplant failed unfortunately. Tremors are better. On depakote ER 250mg  BID, lamotrigine 25mg  BID, and levetiracetam 250mg  BID.   UPDATE 09/16/12: Patient had kidney transplant on Aug 14, 2012. Hospital admission at [facility] was 10 days. Ureter stent was placed during transplant surgery which had to be replaced and prolonged the admission. Discharged with foley catheter, he could not void in 6 hours so foley was replaced. He currently has foley. Kidney is functioning, no more HD needed. He is on week 3 of 6 weeks of in-home therapy. Ambulates with walker but needed wheelchair for long distances. Has had 9 falls in 12 days. Appears very deconditioned. Tremors are worse since surgery, affecting writing, feeding self. Currently on VPA 1000mg  BID and LTG 25mg  BID   UPDATE 07/28/12: Since last visit, had breakthrough sz after dialysis session (told me that double volume was removed inadvertantly). Since then I re-adjusted his meds to VPA 1000mg  BID and LTG 50mg  BID. Now in review of records, I think he may still be on 25mg  BID, but he is not sure.   UPDATE 05/28/12: Since last visit, no sz. Tolerating meds. No new events.   UPDATE 02/20/12: Since last visit, now on dialysis. Also, had breakthrough sz on 01/11/12 (admitted x 1 day). Now on incr VPA 750mg  BID and continues on LTG 25mg  BID. No triggering factors for breakthrough sz.   UPDATE 06/26/11: Last convulsive seizure was in  February 2012. Most recent abnormal spell was 05/05/2011, following a stress test. He had been laying down at home, felt a abnormal sensation in the left occipital region with a pop sensation, followed by a rush sensation in his face down to his stomach with a sour stomach sensation. He  thinks this may have been a petit mall seizure. He was fully aware and conscious during the episode. Episode was similar but somewhat different than his prior episodes. In response to this event he was started on low dose Lamictal 25 mg twice a day. Since that time he has had no further events.   PRIOR HPI: 51 -year-old, right handed, Caucasian male who returns today for followup for history of longstanding seizure disorder. He has a single kidney with renal dysfunction. 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 He has never required dialysis however he is on the transplant list at [facility]. His last major seizure was 05/15/10 and seen in the ER. No loss of B/B, did not bite his tongue. He has had a total of 2 seizures over the last few years. He has had problems with thrombocytopenia in the past . His Depakote dose was reduced and he was seen by a hematologist. The platelet count has returned to normal. He has history of HTN, atrial fib, hypercholesterolemia , and renal failure. Recent labs reviewed from Dr. Deterding's office. Lamictal added at last visit, low dose. No further seizure activity. See ROS.    Observations/Objective:  Generalized: Well developed, in no acute distress  Mentation: Alert oriented to time, place, history taking. Follows all commands speech and language fluent   Assessment and Plan:  58 y.o. 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 here with    ICD-10-CM   1. Seizure disorder ([place])  G40.909    He continues to do well  on current regimen. Last seizure in 2014. We will continue levetiracetam 500mg  twice daily, lamotrigine 25mg  twice daily and divalproex 250mg  twice daily. Annual follow up advised. He verbalizes understanding and agreement with this plan.   No orders of the defined types were placed in this encounter.   No orders of the defined types were placed in this encounter.    Follow Up Instructions:  I discussed the assessment and treatment plan with the patient. The patient was provided an opportunity to ask questions and all were answered. The patient agreed with the plan and demonstrated an understanding of the instructions.   The patient was advised to call back or seek an in-person evaluation if the symptoms worsen or if the condition fails to improve as anticipated.  I provided 20 minutes of non-face-to-face time during this encounter. Patient is located at his place of residence during video visit. Provider is located in the office. Maryelizabeth Kaufmann, CMA helped to facilitate visit.    Debbora Presto, NP

## 2018-09-22 NOTE — Progress Notes (Signed)
I reviewed note and agree with plan.   Penni Bombard, MD 04/25/4386, 8:75 PM Certified in Neurology, Neurophysiology and Neuroimaging  [facility] [address] [place], [place] [address] [phone]

## 2018-10-04 ENCOUNTER — Emergency Department (HOSPITAL_COMMUNITY)
Admission: EM | Admit: 2018-10-04 | Discharge: 2018-10-04 | Disposition: A | Discharge status: Home / Self Care | Payer: Medicare Other | Attending: Emergency Medicine | Admitting: Emergency Medicine

## 2018-10-04 ENCOUNTER — Other Ambulatory Visit: Payer: Self-pay

## 2018-10-04 DIAGNOSIS — Y929 Unspecified place or not applicable: Secondary | ICD-10-CM | POA: Diagnosis not present

## 2018-10-04 DIAGNOSIS — X58XXXA Exposure to other specified factors, initial encounter: Secondary | ICD-10-CM | POA: Insufficient documentation

## 2018-10-04 DIAGNOSIS — Y999 Unspecified external cause status: Secondary | ICD-10-CM | POA: Diagnosis not present

## 2018-10-04 DIAGNOSIS — Z940 Kidney transplant status: Secondary | ICD-10-CM | POA: Insufficient documentation

## 2018-10-04 DIAGNOSIS — I120 Hypertensive chronic kidney disease with stage 5 chronic kidney disease or end stage renal disease: Secondary | ICD-10-CM | POA: Diagnosis not present

## 2018-10-04 DIAGNOSIS — Y9389 Activity, other specified: Secondary | ICD-10-CM | POA: Insufficient documentation

## 2018-10-04 DIAGNOSIS — Z7982 Long term (current) use of aspirin: Secondary | ICD-10-CM | POA: Insufficient documentation

## 2018-10-04 DIAGNOSIS — Q600 Renal agenesis, unilateral: Secondary | ICD-10-CM | POA: Diagnosis not present

## 2018-10-04 DIAGNOSIS — Z992 Dependence on renal dialysis: Secondary | ICD-10-CM | POA: Insufficient documentation

## 2018-10-04 DIAGNOSIS — N186 End stage renal disease: Secondary | ICD-10-CM | POA: Insufficient documentation

## 2018-10-04 DIAGNOSIS — Z79899 Other long term (current) drug therapy: Secondary | ICD-10-CM | POA: Diagnosis not present

## 2018-10-04 DIAGNOSIS — S09302A Unspecified injury of left middle and inner ear, initial encounter: Secondary | ICD-10-CM | POA: Insufficient documentation

## 2018-10-04 MED ORDER — OFLOXACIN 0.3 % OT SOLN: 5.0000 [drp] | Freq: Every day | OTIC | 0 refills | Status: AC

## 2018-10-04 NOTE — ED Provider Notes (Signed)
[facility] EMERGENCY DEPARTMENT Provider Note   CSN: [id number] Arrival date & time: 10/04/18  [place]    History   Chief Complaint Chief Complaint  Patient presents with  . Otalgia    HPI Peter Sloan is a 58 y.o. male with hx a fib on amiodarone, CHF, ESRD on dialysis T Th S who presents to the ED today complaining of bleeding from left ear. Pt reports that he was cleaning his ears around 10 AM this morning when he felt a sudden pain in his left ear; he assumed he went too far with the q tip. A couple of hours later while patient was at dialysis he felt a wet sensation in his left ear and when he stuck his finger inside he noticed blood. Pt reports the ear has been bleeding intermittently since onset but he has had no pain since; the pain only lasted a couple of seconds prior to dissipating. Pt is not on any anticoagulants. Denies hearing loss, tinnitus, fever, chills, headache, vision changes, or any other associated symptoms.        Past Medical History:  Diagnosis Date  . Atrial fibrillation ([place])   . Chronic renal insufficiency    2/2 ischemic ATN, GFR 15-17 ml/min, baseline Cr: 4-4.5, never had a renal biopsy, s/p AV fistula placement in 2007, f/b nephrologist, Dr. Dorothe Pea, [place]  . Congenital solitary kidney   . Congestive heart failure ([place])   . End stage renal disease on dialysis [facility])    fistula left arm, dialysis on Tues, Thurs, Sat.  . Gout    h/o: many in Rt big toe and Rt Knee, never had aspirartion diagnosis  . High cholesterol   . Hyperparathyroidism ([place])   . 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 Nonischemic cardiomyopathy ([facility])    EF 20%(9/06) with most recent EF 50%(3/08), valves normal per 2D echo (3/08), normal doppler and color flow study(3/08) s/p endocmyocardial  biopsy: mild myocyte hypertrophy no myocyte damage or inflammation  . Physical debility   . 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 Seizure disorder ([place])    complex- partial, h/o sz disorder as a child  . Seizures ([place])    last sz 07/05/12 grand mal  . Severe protein-calorie malnutrition ([place])   . Thyroid disorder     Patient Active Problem List   Diagnosis Date Noted  . Postoperative anemia due to acute blood loss 11/18/2012  . End stage renal disease on dialysis ([place])   . 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 Severe protein-calorie malnutrition ([place])   . Atrial fibrillation ([place])   . Physical debility   . 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 Seizure disorder ([place]) 07/28/2012  . Hypokalemia 01/12/2012  . Hyperlipidemia 01/12/2012  . THROMBOCYTOPENIA 08/17/2008  . GOUT 09/18/2006  . CARDIOMYOPATHY 09/18/2006  . RENAL INSUFFICIENCY, CHRONIC 09/18/2006  . SEIZURE DISORDER 09/18/2006    Past Surgical History:  Procedure Laterality Date  . HERNIA REPAIR    . KIDNEY SURGERY    . KIDNEY TRANSPLANT  08/14/12  . NEPHRECTOMY TRANSPLANTED ORGAN  10/14/12  . TONSILLECTOMY          Home Medications    Prior to Admission medications   Medication Sig Start Date End Date Taking? Authorizing Provider  allopurinol (ZYLOPRIM) 300 MG tablet Take 300 mg by mouth daily. 09/29/12   [provider]  amiodarone (PACERONE) 200 MG tablet Take 200 mg by mouth daily.    [provider]  Ascorbic Acid (VITAMIN C) 1000 MG tablet Take 1,000 mg by mouth daily.    [provider]  aspirin 81 MG tablet Take 81 mg by mouth every morning.     [provider]  b complex-vitamin c-folic acid (NEPHRO-VITE) 0.8 MG TABS tablet Take 0.8 mg by mouth daily.     [provider]  divalproex (DEPAKOTE ER) 250 MG 24 hr tablet Take 1 tablet (250 mg total) by mouth daily. 09/18/17   Penumalli, Earlean Polka, MD  lamoTRIgine (LAMICTAL) 25 MG tablet Take 1 tablet (25 mg total) by mouth 2 (two) times daily. 09/18/17   Penumalli, Earlean Polka, MD  levETIRAcetam (KEPPRA) 500 MG tablet Take 1 tablet (500 mg total) by mouth 2 (two) times  daily. 09/18/17   Penumalli, Earlean Polka, MD  ofloxacin (FLOXIN) 0.3 % OTIC solution Place 5 drops into the left ear daily for 7 days. 10/04/18 10/11/18  Eustaquio Maize, PA-C  Omega-3 Fatty Acids (FISH OIL) 1000 MG CAPS Take by mouth.    [provider]  SENSIPAR 60 MG tablet Take 60 mg by mouth daily. 07/21/15   [provider]  UNABLE TO FIND Med Name: Regan Lemming, phosphate binder    [provider]    Family History Family History  Problem Relation Age of Onset  . Osteoarthritis Mother   . Hypertension Father   . Muscular dystrophy Brother   . Heart disease Child        one 76 y/o son has MVP    Social History Social History   Tobacco Use  . Smoking status: Never Smoker  . Smokeless tobacco: Never Used  Substance Use Topics  . Alcohol use: No    Comment: social drinker  . Drug use: No     Allergies   Diphenhydramine hcl   Review of Systems Review of Systems  Constitutional: Negative for chills and fever.  HENT: Positive for ear discharge (blood) and ear pain (resolved). Negative for hearing loss, sinus pressure, sinus pain and sore throat.   Eyes: Negative for redness.  Respiratory: Negative for cough.   Cardiovascular: Negative for leg swelling.  Gastrointestinal: Negative for vomiting.  Musculoskeletal: Negative for myalgias.  Skin: Negative for rash.  Neurological: Negative for syncope and headaches.  Hematological: Does not bruise/bleed easily.     Physical Exam Updated Vital Signs BP (!) 148/95   Pulse 100   Temp 97.9 F (36.6 C) (Oral)   Ht 5\' 4"  (1.626 m)   Wt 68.9 kg   SpO2 100%   BMI 26.09 kg/m   Physical Exam Vitals signs and nursing note reviewed.  Constitutional:      Appearance: He is not ill-appearing.  HENT:     Head: Normocephalic.     Right Ear: Hearing, tympanic membrane and ear canal normal.     Left Ear: Hearing normal.     Ears:     Comments: Small amount of blood noted to left ear canal; when removed TM  visualized without perforation; no tenderness to external ear along pinna, tragus, or mastoid Eyes:     Conjunctiva/sclera: Conjunctivae normal.  Cardiovascular:     Rate and Rhythm: Normal rate and regular rhythm.  Pulmonary:     Effort: Pulmonary effort is normal.     Breath sounds: Normal breath sounds.  Skin:    General: Skin is warm and dry.     Coloration: Skin is not jaundiced.  Neurological:     Mental Status: He is alert.  ED Treatments / Results  Labs (all labs ordered are listed, but only abnormal results are displayed) Labs Reviewed - No data to display  EKG None  Radiology No results found.  Procedures Procedures (including critical care time)  Medications Ordered in ED Medications - No data to display   Initial Impression / Assessment and Plan / ED Course  I have reviewed the triage vital signs and the nursing notes.  Pertinent labs & imaging results that were available during my care of the patient were reviewed by me and considered in my medical decision making (see chart for details).    58 year old male presenting to the ED today for complaints of sudden left ear pain with residual bleeding afterwards. No other concerning symptoms today. Pt is not anticoagulated. No head trauma - reports using q tip and went in too far. Ear canal with blood visualized in vault; patient laid on his left side to drain the blood. TM visualized afterwards without perforation. Will place patient on ciprofloxacin otic drops to cover prophylactically for infection. Pt to follow up with ENT Dr. Janace Hoard regarding ED visit. He is in agreement with plan at this time and stable for discharge home.        Final Clinical Impressions(s) / ED Diagnoses   Final diagnoses:  Injury of tympanic membrane of left ear, initial encounter    ED Discharge Orders         Ordered    ofloxacin (FLOXIN) 0.3 % OTIC solution  Daily     10/04/18 1956           Eustaquio Maize, PA-C  10/04/18 2021    Pattricia Boss, MD 10/07/18 [id number]

## 2018-10-04 NOTE — Discharge Instructions (Signed)
You were seen in the ED today for bleeding from your left ear; there was no perforation visualized to your TM but regardless please follow up with ENT Dr. Janace Hoard. Please use antibiotic drops as prescribed to prevent infection.

## 2018-10-04 NOTE — ED Triage Notes (Signed)
Pt POV d/t possible ruptured L. Ear drum. Pt states he was cleaning his ears this AM w/ a Q-tip and went too far.   Hx HD T,Th,Sat

## 2018-10-06 ENCOUNTER — Other Ambulatory Visit: Payer: Self-pay | Admitting: Diagnostic Neuroimaging

## 2019-03-29 DIAGNOSIS — N2581 Secondary hyperparathyroidism of renal origin: Secondary | ICD-10-CM | POA: Diagnosis not present

## 2019-03-29 DIAGNOSIS — Z992 Dependence on renal dialysis: Secondary | ICD-10-CM | POA: Diagnosis not present

## 2019-03-29 DIAGNOSIS — N186 End stage renal disease: Secondary | ICD-10-CM | POA: Diagnosis not present

## 2019-03-31 DIAGNOSIS — N2581 Secondary hyperparathyroidism of renal origin: Secondary | ICD-10-CM | POA: Diagnosis not present

## 2019-03-31 DIAGNOSIS — Z992 Dependence on renal dialysis: Secondary | ICD-10-CM | POA: Diagnosis not present

## 2019-03-31 DIAGNOSIS — N186 End stage renal disease: Secondary | ICD-10-CM | POA: Diagnosis not present

## 2019-04-02 DIAGNOSIS — N186 End stage renal disease: Secondary | ICD-10-CM | POA: Diagnosis not present

## 2019-04-02 DIAGNOSIS — Z992 Dependence on renal dialysis: Secondary | ICD-10-CM | POA: Diagnosis not present

## 2019-04-02 DIAGNOSIS — N2581 Secondary hyperparathyroidism of renal origin: Secondary | ICD-10-CM | POA: Diagnosis not present

## 2019-04-04 DIAGNOSIS — N186 End stage renal disease: Secondary | ICD-10-CM | POA: Diagnosis not present

## 2019-04-04 DIAGNOSIS — N2581 Secondary hyperparathyroidism of renal origin: Secondary | ICD-10-CM | POA: Diagnosis not present

## 2019-04-04 DIAGNOSIS — Z992 Dependence on renal dialysis: Secondary | ICD-10-CM | POA: Diagnosis not present

## 2019-04-07 DIAGNOSIS — N186 End stage renal disease: Secondary | ICD-10-CM | POA: Diagnosis not present

## 2019-04-07 DIAGNOSIS — Z992 Dependence on renal dialysis: Secondary | ICD-10-CM | POA: Diagnosis not present

## 2019-04-07 DIAGNOSIS — N2581 Secondary hyperparathyroidism of renal origin: Secondary | ICD-10-CM | POA: Diagnosis not present

## 2019-04-09 DIAGNOSIS — N186 End stage renal disease: Secondary | ICD-10-CM | POA: Diagnosis not present

## 2019-04-09 DIAGNOSIS — Z992 Dependence on renal dialysis: Secondary | ICD-10-CM | POA: Diagnosis not present

## 2019-04-09 DIAGNOSIS — N2581 Secondary hyperparathyroidism of renal origin: Secondary | ICD-10-CM | POA: Diagnosis not present

## 2019-04-11 DIAGNOSIS — N186 End stage renal disease: Secondary | ICD-10-CM | POA: Diagnosis not present

## 2019-04-11 DIAGNOSIS — Z992 Dependence on renal dialysis: Secondary | ICD-10-CM | POA: Diagnosis not present

## 2019-04-11 DIAGNOSIS — N2581 Secondary hyperparathyroidism of renal origin: Secondary | ICD-10-CM | POA: Diagnosis not present

## 2019-04-14 DIAGNOSIS — N2581 Secondary hyperparathyroidism of renal origin: Secondary | ICD-10-CM | POA: Diagnosis not present

## 2019-04-14 DIAGNOSIS — Z992 Dependence on renal dialysis: Secondary | ICD-10-CM | POA: Diagnosis not present

## 2019-04-14 DIAGNOSIS — N186 End stage renal disease: Secondary | ICD-10-CM | POA: Diagnosis not present

## 2019-04-16 DIAGNOSIS — N2581 Secondary hyperparathyroidism of renal origin: Secondary | ICD-10-CM | POA: Diagnosis not present

## 2019-04-16 DIAGNOSIS — Z992 Dependence on renal dialysis: Secondary | ICD-10-CM | POA: Diagnosis not present

## 2019-04-16 DIAGNOSIS — N186 End stage renal disease: Secondary | ICD-10-CM | POA: Diagnosis not present

## 2019-04-18 DIAGNOSIS — N2581 Secondary hyperparathyroidism of renal origin: Secondary | ICD-10-CM | POA: Diagnosis not present

## 2019-04-18 DIAGNOSIS — N186 End stage renal disease: Secondary | ICD-10-CM | POA: Diagnosis not present

## 2019-04-18 DIAGNOSIS — Z992 Dependence on renal dialysis: Secondary | ICD-10-CM | POA: Diagnosis not present

## 2019-04-21 DIAGNOSIS — N186 End stage renal disease: Secondary | ICD-10-CM | POA: Diagnosis not present

## 2019-04-21 DIAGNOSIS — N2581 Secondary hyperparathyroidism of renal origin: Secondary | ICD-10-CM | POA: Diagnosis not present

## 2019-04-21 DIAGNOSIS — Z992 Dependence on renal dialysis: Secondary | ICD-10-CM | POA: Diagnosis not present

## 2019-04-23 DIAGNOSIS — Z992 Dependence on renal dialysis: Secondary | ICD-10-CM | POA: Diagnosis not present

## 2019-04-23 DIAGNOSIS — N186 End stage renal disease: Secondary | ICD-10-CM | POA: Diagnosis not present

## 2019-04-23 DIAGNOSIS — N2581 Secondary hyperparathyroidism of renal origin: Secondary | ICD-10-CM | POA: Diagnosis not present

## 2019-04-25 DIAGNOSIS — N2581 Secondary hyperparathyroidism of renal origin: Secondary | ICD-10-CM | POA: Diagnosis not present

## 2019-04-25 DIAGNOSIS — Z992 Dependence on renal dialysis: Secondary | ICD-10-CM | POA: Diagnosis not present

## 2019-04-25 DIAGNOSIS — N186 End stage renal disease: Secondary | ICD-10-CM | POA: Diagnosis not present

## 2019-04-26 DIAGNOSIS — N186 End stage renal disease: Secondary | ICD-10-CM | POA: Diagnosis not present

## 2019-04-26 DIAGNOSIS — Z992 Dependence on renal dialysis: Secondary | ICD-10-CM | POA: Diagnosis not present

## 2019-04-26 DIAGNOSIS — I120 Hypertensive chronic kidney disease with stage 5 chronic kidney disease or end stage renal disease: Secondary | ICD-10-CM | POA: Diagnosis not present

## 2019-04-28 DIAGNOSIS — Z992 Dependence on renal dialysis: Secondary | ICD-10-CM | POA: Diagnosis not present

## 2019-04-28 DIAGNOSIS — N2581 Secondary hyperparathyroidism of renal origin: Secondary | ICD-10-CM | POA: Diagnosis not present

## 2019-04-28 DIAGNOSIS — N186 End stage renal disease: Secondary | ICD-10-CM | POA: Diagnosis not present

## 2019-04-30 DIAGNOSIS — N186 End stage renal disease: Secondary | ICD-10-CM | POA: Diagnosis not present

## 2019-04-30 DIAGNOSIS — N2581 Secondary hyperparathyroidism of renal origin: Secondary | ICD-10-CM | POA: Diagnosis not present

## 2019-04-30 DIAGNOSIS — Z992 Dependence on renal dialysis: Secondary | ICD-10-CM | POA: Diagnosis not present

## 2019-05-02 DIAGNOSIS — Z992 Dependence on renal dialysis: Secondary | ICD-10-CM | POA: Diagnosis not present

## 2019-05-02 DIAGNOSIS — N2581 Secondary hyperparathyroidism of renal origin: Secondary | ICD-10-CM | POA: Diagnosis not present

## 2019-05-02 DIAGNOSIS — N186 End stage renal disease: Secondary | ICD-10-CM | POA: Diagnosis not present

## 2019-05-05 DIAGNOSIS — N2581 Secondary hyperparathyroidism of renal origin: Secondary | ICD-10-CM | POA: Diagnosis not present

## 2019-05-05 DIAGNOSIS — Z992 Dependence on renal dialysis: Secondary | ICD-10-CM | POA: Diagnosis not present

## 2019-05-05 DIAGNOSIS — N186 End stage renal disease: Secondary | ICD-10-CM | POA: Diagnosis not present

## 2019-05-07 DIAGNOSIS — N2581 Secondary hyperparathyroidism of renal origin: Secondary | ICD-10-CM | POA: Diagnosis not present

## 2019-05-07 DIAGNOSIS — N186 End stage renal disease: Secondary | ICD-10-CM | POA: Diagnosis not present

## 2019-05-07 DIAGNOSIS — Z992 Dependence on renal dialysis: Secondary | ICD-10-CM | POA: Diagnosis not present

## 2019-05-09 DIAGNOSIS — Z992 Dependence on renal dialysis: Secondary | ICD-10-CM | POA: Diagnosis not present

## 2019-05-09 DIAGNOSIS — N2581 Secondary hyperparathyroidism of renal origin: Secondary | ICD-10-CM | POA: Diagnosis not present

## 2019-05-09 DIAGNOSIS — N186 End stage renal disease: Secondary | ICD-10-CM | POA: Diagnosis not present

## 2019-05-12 DIAGNOSIS — N2581 Secondary hyperparathyroidism of renal origin: Secondary | ICD-10-CM | POA: Diagnosis not present

## 2019-05-12 DIAGNOSIS — Z992 Dependence on renal dialysis: Secondary | ICD-10-CM | POA: Diagnosis not present

## 2019-05-12 DIAGNOSIS — N186 End stage renal disease: Secondary | ICD-10-CM | POA: Diagnosis not present

## 2019-05-13 DIAGNOSIS — N186 End stage renal disease: Secondary | ICD-10-CM | POA: Diagnosis not present

## 2019-05-13 DIAGNOSIS — I425 Other restrictive cardiomyopathy: Secondary | ICD-10-CM | POA: Diagnosis not present

## 2019-05-13 DIAGNOSIS — I2510 Atherosclerotic heart disease of native coronary artery without angina pectoris: Secondary | ICD-10-CM | POA: Diagnosis not present

## 2019-05-13 DIAGNOSIS — E7849 Other hyperlipidemia: Secondary | ICD-10-CM | POA: Diagnosis not present

## 2019-05-14 DIAGNOSIS — N2581 Secondary hyperparathyroidism of renal origin: Secondary | ICD-10-CM | POA: Diagnosis not present

## 2019-05-14 DIAGNOSIS — N186 End stage renal disease: Secondary | ICD-10-CM | POA: Diagnosis not present

## 2019-05-14 DIAGNOSIS — Z992 Dependence on renal dialysis: Secondary | ICD-10-CM | POA: Diagnosis not present

## 2019-05-16 DIAGNOSIS — N186 End stage renal disease: Secondary | ICD-10-CM | POA: Diagnosis not present

## 2019-05-16 DIAGNOSIS — N2581 Secondary hyperparathyroidism of renal origin: Secondary | ICD-10-CM | POA: Diagnosis not present

## 2019-05-16 DIAGNOSIS — Z992 Dependence on renal dialysis: Secondary | ICD-10-CM | POA: Diagnosis not present

## 2019-05-19 DIAGNOSIS — Z992 Dependence on renal dialysis: Secondary | ICD-10-CM | POA: Diagnosis not present

## 2019-05-19 DIAGNOSIS — N186 End stage renal disease: Secondary | ICD-10-CM | POA: Diagnosis not present

## 2019-05-19 DIAGNOSIS — N2581 Secondary hyperparathyroidism of renal origin: Secondary | ICD-10-CM | POA: Diagnosis not present

## 2019-05-21 DIAGNOSIS — N186 End stage renal disease: Secondary | ICD-10-CM | POA: Diagnosis not present

## 2019-05-21 DIAGNOSIS — N2581 Secondary hyperparathyroidism of renal origin: Secondary | ICD-10-CM | POA: Diagnosis not present

## 2019-05-21 DIAGNOSIS — Z992 Dependence on renal dialysis: Secondary | ICD-10-CM | POA: Diagnosis not present

## 2019-05-23 DIAGNOSIS — N2581 Secondary hyperparathyroidism of renal origin: Secondary | ICD-10-CM | POA: Diagnosis not present

## 2019-05-23 DIAGNOSIS — Z992 Dependence on renal dialysis: Secondary | ICD-10-CM | POA: Diagnosis not present

## 2019-05-23 DIAGNOSIS — N186 End stage renal disease: Secondary | ICD-10-CM | POA: Diagnosis not present

## 2019-05-24 DIAGNOSIS — N186 End stage renal disease: Secondary | ICD-10-CM | POA: Diagnosis not present

## 2019-05-24 DIAGNOSIS — Z992 Dependence on renal dialysis: Secondary | ICD-10-CM | POA: Diagnosis not present

## 2019-05-24 DIAGNOSIS — I120 Hypertensive chronic kidney disease with stage 5 chronic kidney disease or end stage renal disease: Secondary | ICD-10-CM | POA: Diagnosis not present

## 2019-05-26 DIAGNOSIS — N186 End stage renal disease: Secondary | ICD-10-CM | POA: Diagnosis not present

## 2019-05-26 DIAGNOSIS — N2581 Secondary hyperparathyroidism of renal origin: Secondary | ICD-10-CM | POA: Diagnosis not present

## 2019-05-26 DIAGNOSIS — Z992 Dependence on renal dialysis: Secondary | ICD-10-CM | POA: Diagnosis not present

## 2019-05-28 DIAGNOSIS — N186 End stage renal disease: Secondary | ICD-10-CM | POA: Diagnosis not present

## 2019-05-28 DIAGNOSIS — Z992 Dependence on renal dialysis: Secondary | ICD-10-CM | POA: Diagnosis not present

## 2019-05-28 DIAGNOSIS — N2581 Secondary hyperparathyroidism of renal origin: Secondary | ICD-10-CM | POA: Diagnosis not present

## 2019-05-30 DIAGNOSIS — N2581 Secondary hyperparathyroidism of renal origin: Secondary | ICD-10-CM | POA: Diagnosis not present

## 2019-05-30 DIAGNOSIS — Z992 Dependence on renal dialysis: Secondary | ICD-10-CM | POA: Diagnosis not present

## 2019-05-30 DIAGNOSIS — N186 End stage renal disease: Secondary | ICD-10-CM | POA: Diagnosis not present

## 2019-06-02 DIAGNOSIS — N186 End stage renal disease: Secondary | ICD-10-CM | POA: Diagnosis not present

## 2019-06-02 DIAGNOSIS — N2581 Secondary hyperparathyroidism of renal origin: Secondary | ICD-10-CM | POA: Diagnosis not present

## 2019-06-02 DIAGNOSIS — Z992 Dependence on renal dialysis: Secondary | ICD-10-CM | POA: Diagnosis not present

## 2019-06-04 DIAGNOSIS — N2581 Secondary hyperparathyroidism of renal origin: Secondary | ICD-10-CM | POA: Diagnosis not present

## 2019-06-04 DIAGNOSIS — N186 End stage renal disease: Secondary | ICD-10-CM | POA: Diagnosis not present

## 2019-06-04 DIAGNOSIS — Z992 Dependence on renal dialysis: Secondary | ICD-10-CM | POA: Diagnosis not present

## 2019-06-06 DIAGNOSIS — N2581 Secondary hyperparathyroidism of renal origin: Secondary | ICD-10-CM | POA: Diagnosis not present

## 2019-06-06 DIAGNOSIS — Z992 Dependence on renal dialysis: Secondary | ICD-10-CM | POA: Diagnosis not present

## 2019-06-06 DIAGNOSIS — N186 End stage renal disease: Secondary | ICD-10-CM | POA: Diagnosis not present

## 2019-06-09 DIAGNOSIS — N186 End stage renal disease: Secondary | ICD-10-CM | POA: Diagnosis not present

## 2019-06-09 DIAGNOSIS — Z992 Dependence on renal dialysis: Secondary | ICD-10-CM | POA: Diagnosis not present

## 2019-06-09 DIAGNOSIS — N2581 Secondary hyperparathyroidism of renal origin: Secondary | ICD-10-CM | POA: Diagnosis not present

## 2019-06-11 DIAGNOSIS — Z992 Dependence on renal dialysis: Secondary | ICD-10-CM | POA: Diagnosis not present

## 2019-06-11 DIAGNOSIS — N2581 Secondary hyperparathyroidism of renal origin: Secondary | ICD-10-CM | POA: Diagnosis not present

## 2019-06-11 DIAGNOSIS — N186 End stage renal disease: Secondary | ICD-10-CM | POA: Diagnosis not present

## 2019-06-13 DIAGNOSIS — N2581 Secondary hyperparathyroidism of renal origin: Secondary | ICD-10-CM | POA: Diagnosis not present

## 2019-06-13 DIAGNOSIS — N186 End stage renal disease: Secondary | ICD-10-CM | POA: Diagnosis not present

## 2019-06-13 DIAGNOSIS — Z992 Dependence on renal dialysis: Secondary | ICD-10-CM | POA: Diagnosis not present

## 2019-06-16 DIAGNOSIS — N186 End stage renal disease: Secondary | ICD-10-CM | POA: Diagnosis not present

## 2019-06-16 DIAGNOSIS — N2581 Secondary hyperparathyroidism of renal origin: Secondary | ICD-10-CM | POA: Diagnosis not present

## 2019-06-16 DIAGNOSIS — Z992 Dependence on renal dialysis: Secondary | ICD-10-CM | POA: Diagnosis not present

## 2019-06-18 DIAGNOSIS — N186 End stage renal disease: Secondary | ICD-10-CM | POA: Diagnosis not present

## 2019-06-18 DIAGNOSIS — N2581 Secondary hyperparathyroidism of renal origin: Secondary | ICD-10-CM | POA: Diagnosis not present

## 2019-06-18 DIAGNOSIS — Z992 Dependence on renal dialysis: Secondary | ICD-10-CM | POA: Diagnosis not present

## 2019-06-20 DIAGNOSIS — N186 End stage renal disease: Secondary | ICD-10-CM | POA: Diagnosis not present

## 2019-06-20 DIAGNOSIS — N2581 Secondary hyperparathyroidism of renal origin: Secondary | ICD-10-CM | POA: Diagnosis not present

## 2019-06-20 DIAGNOSIS — Z992 Dependence on renal dialysis: Secondary | ICD-10-CM | POA: Diagnosis not present

## 2019-06-23 DIAGNOSIS — N186 End stage renal disease: Secondary | ICD-10-CM | POA: Diagnosis not present

## 2019-06-23 DIAGNOSIS — Z992 Dependence on renal dialysis: Secondary | ICD-10-CM | POA: Diagnosis not present

## 2019-06-23 DIAGNOSIS — N2581 Secondary hyperparathyroidism of renal origin: Secondary | ICD-10-CM | POA: Diagnosis not present

## 2019-06-24 DIAGNOSIS — N186 End stage renal disease: Secondary | ICD-10-CM | POA: Diagnosis not present

## 2019-06-24 DIAGNOSIS — I120 Hypertensive chronic kidney disease with stage 5 chronic kidney disease or end stage renal disease: Secondary | ICD-10-CM | POA: Diagnosis not present

## 2019-06-24 DIAGNOSIS — Z992 Dependence on renal dialysis: Secondary | ICD-10-CM | POA: Diagnosis not present

## 2019-06-25 DIAGNOSIS — N186 End stage renal disease: Secondary | ICD-10-CM | POA: Diagnosis not present

## 2019-06-25 DIAGNOSIS — N2581 Secondary hyperparathyroidism of renal origin: Secondary | ICD-10-CM | POA: Diagnosis not present

## 2019-06-25 DIAGNOSIS — Z992 Dependence on renal dialysis: Secondary | ICD-10-CM | POA: Diagnosis not present

## 2019-06-27 DIAGNOSIS — Z992 Dependence on renal dialysis: Secondary | ICD-10-CM | POA: Diagnosis not present

## 2019-06-27 DIAGNOSIS — N186 End stage renal disease: Secondary | ICD-10-CM | POA: Diagnosis not present

## 2019-06-27 DIAGNOSIS — N2581 Secondary hyperparathyroidism of renal origin: Secondary | ICD-10-CM | POA: Diagnosis not present

## 2019-06-30 DIAGNOSIS — N2581 Secondary hyperparathyroidism of renal origin: Secondary | ICD-10-CM | POA: Diagnosis not present

## 2019-06-30 DIAGNOSIS — N186 End stage renal disease: Secondary | ICD-10-CM | POA: Diagnosis not present

## 2019-06-30 DIAGNOSIS — Z992 Dependence on renal dialysis: Secondary | ICD-10-CM | POA: Diagnosis not present

## 2019-07-02 DIAGNOSIS — Z992 Dependence on renal dialysis: Secondary | ICD-10-CM | POA: Diagnosis not present

## 2019-07-02 DIAGNOSIS — N186 End stage renal disease: Secondary | ICD-10-CM | POA: Diagnosis not present

## 2019-07-02 DIAGNOSIS — N2581 Secondary hyperparathyroidism of renal origin: Secondary | ICD-10-CM | POA: Diagnosis not present

## 2019-07-04 DIAGNOSIS — N186 End stage renal disease: Secondary | ICD-10-CM | POA: Diagnosis not present

## 2019-07-04 DIAGNOSIS — N2581 Secondary hyperparathyroidism of renal origin: Secondary | ICD-10-CM | POA: Diagnosis not present

## 2019-07-04 DIAGNOSIS — Z992 Dependence on renal dialysis: Secondary | ICD-10-CM | POA: Diagnosis not present

## 2019-07-07 DIAGNOSIS — N186 End stage renal disease: Secondary | ICD-10-CM | POA: Diagnosis not present

## 2019-07-07 DIAGNOSIS — Z992 Dependence on renal dialysis: Secondary | ICD-10-CM | POA: Diagnosis not present

## 2019-07-07 DIAGNOSIS — N2581 Secondary hyperparathyroidism of renal origin: Secondary | ICD-10-CM | POA: Diagnosis not present

## 2019-07-09 DIAGNOSIS — Z992 Dependence on renal dialysis: Secondary | ICD-10-CM | POA: Diagnosis not present

## 2019-07-09 DIAGNOSIS — N186 End stage renal disease: Secondary | ICD-10-CM | POA: Diagnosis not present

## 2019-07-09 DIAGNOSIS — N2581 Secondary hyperparathyroidism of renal origin: Secondary | ICD-10-CM | POA: Diagnosis not present

## 2019-07-11 DIAGNOSIS — N2581 Secondary hyperparathyroidism of renal origin: Secondary | ICD-10-CM | POA: Diagnosis not present

## 2019-07-11 DIAGNOSIS — N186 End stage renal disease: Secondary | ICD-10-CM | POA: Diagnosis not present

## 2019-07-11 DIAGNOSIS — Z992 Dependence on renal dialysis: Secondary | ICD-10-CM | POA: Diagnosis not present

## 2019-07-13 DIAGNOSIS — N186 End stage renal disease: Secondary | ICD-10-CM | POA: Diagnosis not present

## 2019-07-13 DIAGNOSIS — L729 Follicular cyst of the skin and subcutaneous tissue, unspecified: Secondary | ICD-10-CM | POA: Diagnosis not present

## 2019-07-13 DIAGNOSIS — Z940 Kidney transplant status: Secondary | ICD-10-CM | POA: Diagnosis not present

## 2019-07-14 DIAGNOSIS — N2581 Secondary hyperparathyroidism of renal origin: Secondary | ICD-10-CM | POA: Diagnosis not present

## 2019-07-14 DIAGNOSIS — Z992 Dependence on renal dialysis: Secondary | ICD-10-CM | POA: Diagnosis not present

## 2019-07-14 DIAGNOSIS — N186 End stage renal disease: Secondary | ICD-10-CM | POA: Diagnosis not present

## 2019-07-16 DIAGNOSIS — N2581 Secondary hyperparathyroidism of renal origin: Secondary | ICD-10-CM | POA: Diagnosis not present

## 2019-07-16 DIAGNOSIS — Z992 Dependence on renal dialysis: Secondary | ICD-10-CM | POA: Diagnosis not present

## 2019-07-16 DIAGNOSIS — N186 End stage renal disease: Secondary | ICD-10-CM | POA: Diagnosis not present

## 2019-07-18 DIAGNOSIS — Z992 Dependence on renal dialysis: Secondary | ICD-10-CM | POA: Diagnosis not present

## 2019-07-18 DIAGNOSIS — N2581 Secondary hyperparathyroidism of renal origin: Secondary | ICD-10-CM | POA: Diagnosis not present

## 2019-07-18 DIAGNOSIS — N186 End stage renal disease: Secondary | ICD-10-CM | POA: Diagnosis not present

## 2019-07-21 DIAGNOSIS — Z992 Dependence on renal dialysis: Secondary | ICD-10-CM | POA: Diagnosis not present

## 2019-07-21 DIAGNOSIS — N186 End stage renal disease: Secondary | ICD-10-CM | POA: Diagnosis not present

## 2019-07-21 DIAGNOSIS — N2581 Secondary hyperparathyroidism of renal origin: Secondary | ICD-10-CM | POA: Diagnosis not present

## 2019-07-23 DIAGNOSIS — Z992 Dependence on renal dialysis: Secondary | ICD-10-CM | POA: Diagnosis not present

## 2019-07-23 DIAGNOSIS — N2581 Secondary hyperparathyroidism of renal origin: Secondary | ICD-10-CM | POA: Diagnosis not present

## 2019-07-23 DIAGNOSIS — N186 End stage renal disease: Secondary | ICD-10-CM | POA: Diagnosis not present

## 2019-07-24 DIAGNOSIS — Z992 Dependence on renal dialysis: Secondary | ICD-10-CM | POA: Diagnosis not present

## 2019-07-24 DIAGNOSIS — I120 Hypertensive chronic kidney disease with stage 5 chronic kidney disease or end stage renal disease: Secondary | ICD-10-CM | POA: Diagnosis not present

## 2019-07-24 DIAGNOSIS — N186 End stage renal disease: Secondary | ICD-10-CM | POA: Diagnosis not present

## 2019-07-25 DIAGNOSIS — N2581 Secondary hyperparathyroidism of renal origin: Secondary | ICD-10-CM | POA: Diagnosis not present

## 2019-07-25 DIAGNOSIS — N186 End stage renal disease: Secondary | ICD-10-CM | POA: Diagnosis not present

## 2019-07-25 DIAGNOSIS — Z992 Dependence on renal dialysis: Secondary | ICD-10-CM | POA: Diagnosis not present

## 2019-07-28 DIAGNOSIS — Z992 Dependence on renal dialysis: Secondary | ICD-10-CM | POA: Diagnosis not present

## 2019-07-28 DIAGNOSIS — N2581 Secondary hyperparathyroidism of renal origin: Secondary | ICD-10-CM | POA: Diagnosis not present

## 2019-07-28 DIAGNOSIS — N186 End stage renal disease: Secondary | ICD-10-CM | POA: Diagnosis not present

## 2019-07-30 DIAGNOSIS — Z992 Dependence on renal dialysis: Secondary | ICD-10-CM | POA: Diagnosis not present

## 2019-07-30 DIAGNOSIS — N186 End stage renal disease: Secondary | ICD-10-CM | POA: Diagnosis not present

## 2019-07-30 DIAGNOSIS — N2581 Secondary hyperparathyroidism of renal origin: Secondary | ICD-10-CM | POA: Diagnosis not present

## 2019-08-01 DIAGNOSIS — Z992 Dependence on renal dialysis: Secondary | ICD-10-CM | POA: Diagnosis not present

## 2019-08-01 DIAGNOSIS — N186 End stage renal disease: Secondary | ICD-10-CM | POA: Diagnosis not present

## 2019-08-01 DIAGNOSIS — N2581 Secondary hyperparathyroidism of renal origin: Secondary | ICD-10-CM | POA: Diagnosis not present

## 2019-08-04 DIAGNOSIS — N2581 Secondary hyperparathyroidism of renal origin: Secondary | ICD-10-CM | POA: Diagnosis not present

## 2019-08-04 DIAGNOSIS — N186 End stage renal disease: Secondary | ICD-10-CM | POA: Diagnosis not present

## 2019-08-04 DIAGNOSIS — Z992 Dependence on renal dialysis: Secondary | ICD-10-CM | POA: Diagnosis not present

## 2019-08-06 DIAGNOSIS — N2581 Secondary hyperparathyroidism of renal origin: Secondary | ICD-10-CM | POA: Diagnosis not present

## 2019-08-06 DIAGNOSIS — Z992 Dependence on renal dialysis: Secondary | ICD-10-CM | POA: Diagnosis not present

## 2019-08-06 DIAGNOSIS — N186 End stage renal disease: Secondary | ICD-10-CM | POA: Diagnosis not present

## 2019-08-08 DIAGNOSIS — Z992 Dependence on renal dialysis: Secondary | ICD-10-CM | POA: Diagnosis not present

## 2019-08-08 DIAGNOSIS — N186 End stage renal disease: Secondary | ICD-10-CM | POA: Diagnosis not present

## 2019-08-08 DIAGNOSIS — N2581 Secondary hyperparathyroidism of renal origin: Secondary | ICD-10-CM | POA: Diagnosis not present

## 2019-08-10 ENCOUNTER — Other Ambulatory Visit: Payer: Self-pay

## 2019-08-10 ENCOUNTER — Emergency Department (HOSPITAL_COMMUNITY): Payer: Medicare HMO

## 2019-08-10 ENCOUNTER — Encounter (HOSPITAL_COMMUNITY): Payer: Self-pay

## 2019-08-10 ENCOUNTER — Emergency Department (HOSPITAL_COMMUNITY)
Admission: EM | Admit: 2019-08-10 | Discharge: 2019-08-10 | Disposition: A | Discharge status: Home / Self Care | Payer: Medicare HMO | Attending: Emergency Medicine | Admitting: Emergency Medicine

## 2019-08-10 DIAGNOSIS — M25562 Pain in left knee: Secondary | ICD-10-CM | POA: Diagnosis not present

## 2019-08-10 DIAGNOSIS — I120 Hypertensive chronic kidney disease with stage 5 chronic kidney disease or end stage renal disease: Secondary | ICD-10-CM | POA: Insufficient documentation

## 2019-08-10 DIAGNOSIS — Z79899 Other long term (current) drug therapy: Secondary | ICD-10-CM | POA: Insufficient documentation

## 2019-08-10 DIAGNOSIS — Z992 Dependence on renal dialysis: Secondary | ICD-10-CM | POA: Diagnosis not present

## 2019-08-10 DIAGNOSIS — Z940 Kidney transplant status: Secondary | ICD-10-CM | POA: Diagnosis not present

## 2019-08-10 DIAGNOSIS — Q600 Renal agenesis, unilateral: Secondary | ICD-10-CM | POA: Diagnosis not present

## 2019-08-10 DIAGNOSIS — N186 End stage renal disease: Secondary | ICD-10-CM | POA: Diagnosis not present

## 2019-08-10 DIAGNOSIS — M25552 Pain in left hip: Secondary | ICD-10-CM | POA: Diagnosis not present

## 2019-08-10 DIAGNOSIS — Z7982 Long term (current) use of aspirin: Secondary | ICD-10-CM | POA: Insufficient documentation

## 2019-08-10 DIAGNOSIS — M79652 Pain in left thigh: Secondary | ICD-10-CM | POA: Insufficient documentation

## 2019-08-10 MED ORDER — DICLOFENAC SODIUM 1 % EX GEL: 4.0000 g | Freq: Four times a day (QID) | CUTANEOUS | 0 refills | Status: DC | PRN

## 2019-08-10 MED ORDER — LIDOCAINE 5 % EX PTCH: 1.0000 | MEDICATED_PATCH | CUTANEOUS | 0 refills | Status: DC

## 2019-08-10 NOTE — ED Triage Notes (Signed)
Pt reports pain to left hip since last Thursday, pt unsure if he slept on it wrong but he heard a "pop' on Saturday. Pt able to ambulate but with pain. Denies numbness or tingling.

## 2019-08-10 NOTE — ED Provider Notes (Signed)
[facility] EMERG[facility]MENT Provider Note   CSN: [id number] Arrival date & time: 08/10/19  8144     History Chief Complaint  Patient presents with  . Hip Pain    Peter Sloan is a 59 y.o. male with a history of afib not anticoagulated, ESRD on dialysis (T/Th/Sat- last dialyzed Saturday), hypercholesterolemia, seizures, and prior renal transplant s/p nephrectomy of transplanted kidney who presents to the ED with complaints of left lower extremity pain x 4 days. Patient states pain is located in the L hip and radiates down the lateral thigh to the knee. Pain is constant, better in certain positions, worse with ambulation. He was concerned it was dislocated so he tried to push it back in. Denies other traumatic injury. He thinks he had been sleeping on it wrong. Denies numbness, tingling, weakness, open wounds, rashes, or leg swelling.   HPI     Past Medical History:  Diagnosis Date  . Atrial fibrillation ([place])   . Chronic renal insufficiency    2/2 ischemic ATN, GFR 15-17 ml/min, baseline Cr: 4-4.5, never had a renal biopsy, s/p AV fistula placement in 2007, f/b nephrologist, Dr. Dorothe Pea, [place]  . Congenital solitary kidney   . Congestive heart failure ([place])   . End stage renal disease on dialysis [facility])    fistula left arm, dialysis on Tues, Thurs, Sat.  . Gout    h/o: many in Rt big toe and Rt Knee, never had aspirartion diagnosis  . High cholesterol   . Hyperparathyroidism ([place])   . 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 Nonischemic cardiomyopathy ([facility])    EF 20%(9/06) with most recent EF 50%(3/08), valves normal per 2D echo (3/08), normal doppler and color flow study(3/08) s/p endocmyocardial  biopsy: mild myocyte hypertrophy no myocyte damage or inflammation  . Physical debility   . 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 Seizure disorder ([place])    complex- partial, h/o sz disorder as a child  . Seizures ([place])    last sz 07/05/12 grand mal   . Severe protein-calorie malnutrition ([place])   . Thyroid disorder     Patient Active Problem List   Diagnosis Date Noted  . Postoperative anemia due to acute blood loss 11/18/2012  . End stage renal disease on dialysis ([place])   . 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 Severe protein-calorie malnutrition ([place])   . Atrial fibrillation ([place])   . Physical debility   . 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 Seizure disorder ([place]) 07/28/2012  . Hypokalemia 01/12/2012  . Hyperlipidemia 01/12/2012  . THROMBOCYTOPENIA 08/17/2008  . GOUT 09/18/2006  . CARDIOMYOPATHY 09/18/2006  . RENAL INSUFFICIENCY, CHRONIC 09/18/2006  . SEIZURE DISORDER 09/18/2006    Past Surgical History:  Procedure Laterality Date  . HERNIA REPAIR    . KIDNEY SURGERY    . KIDNEY TRANSPLANT  08/14/12  . NEPHRECTOMY TRANSPLANTED ORGAN  10/14/12  . TONSILLECTOMY         Family History  Problem Relation Age of Onset  . Osteoarthritis Mother   . Hypertension Father   . Muscular dystrophy Brother   . Heart disease Child        one 81 y/o son has MVP    Social History   Tobacco Use  . Smoking status: Never Smoker  . Smokeless tobacco: Never Used  Substance Use Topics  . Alcohol use: No    Comment: social drinker  . Drug use: No    Home Medications  Prior to Admission medications   Medication Sig Start Date End Date Taking? Authorizing Provider  allopurinol (ZYLOPRIM) 300 MG tablet Take 300 mg by mouth daily. 09/29/12   [provider]  amiodarone (PACERONE) 200 MG tablet Take 200 mg by mouth daily.    [provider]  Ascorbic Acid (VITAMIN C) 1000 MG tablet Take 1,000 mg by mouth daily.    [provider]  aspirin 81 MG tablet Take 81 mg by mouth every morning.     [provider]  b complex-vitamin c-folic acid (NEPHRO-VITE) 0.8 MG TABS tablet Take 0.8 mg by mouth daily.     [provider]  divalproex (DEPAKOTE ER) 250 MG 24 hr tablet TAKE 1 TABLET BY  MOUTH EVERY DAY 10/06/18   Penumalli, Earlean Polka, MD  lamoTRIgine (LAMICTAL) 25 MG tablet TAKE 1 TABLET BY MOUTH TWICE A DAY 10/06/18   Penumalli, Vikram R, MD  levETIRAcetam (KEPPRA) 500 MG tablet TAKE 1 TABLET BY MOUTH TWICE A DAY 10/06/18   Penumalli, Earlean Polka, MD  Omega-3 Fatty Acids (FISH OIL) 1000 MG CAPS Take by mouth.    [provider]  SENSIPAR 60 MG tablet Take 60 mg by mouth daily. 07/21/15   [provider]  UNABLE TO FIND Med Name: Regan Lemming, phosphate binder    [provider]    Allergies    Diphenhydramine hcl  Review of Systems   Review of Systems  Constitutional: Negative for chills and fever.  Respiratory: Negative for shortness of breath.   Cardiovascular: Negative for chest pain and leg swelling.  Musculoskeletal: Positive for arthralgias and myalgias.  Skin: Negative for color change, rash and wound.  Neurological: Negative for weakness and numbness.    Physical Exam Updated Vital Signs BP (!) 165/121 (BP Location: Right Arm)   Pulse 63   Temp 98.4 F (36.9 C) (Oral)   Resp 16   SpO2 100%   Physical Exam Vitals and nursing note reviewed.  Constitutional:      General: He is not in acute distress.    Appearance: He is not ill-appearing or toxic-appearing.  HENT:     Head: Normocephalic and atraumatic.  Cardiovascular:     Pulses:          Dorsalis pedis pulses are 2+ on the right side and 2+ on the left side.       Posterior tibial pulses are 2+ on the right side and 2+ on the left side.  Pulmonary:     Effort: Pulmonary effort is normal.  Musculoskeletal:     Comments: Lower extremities: No obvious deformity, appreciable swelling, edema, erythema, ecchymosis, warmth, or open wounds. Patient has intact AROM to bilateral hips, knees, ankles, and all digits. Tender to palpation to the lateral left hip over the greater trochanter extending to the lateral thigh and lateral joint line of the knee. Otherwise nontender. Compartments are  soft. No obvious joint instability.   Skin:    General: Skin is warm and dry.     Capillary Refill: Capillary refill takes less than 2 seconds.  Neurological:     Mental Status: He is alert.     Comments: Alert. Clear speech. Sensation grossly intact to bilateral lower extremities. 5/5 strength with plantar/dorsiflexion bilaterally. Patient ambulatory.   Psychiatric:        Mood and Affect: Mood normal.        Behavior: Behavior normal.    ED Results / Procedures / Treatments   Labs (all labs ordered  are listed, but only abnormal results are displayed) Labs Reviewed - No data to display  EKG None  Radiology DG Knee Complete 4 Views Left  Result Date: 08/10/2019 CLINICAL DATA:  Pain, LEFT knee pain radiating down lateral surface of LEFT leg EXAM: LEFT KNEE - COMPLETE 4+ VIEW COMPARISON:  None FINDINGS: No evidence of fracture, dislocation, or joint effusion. No evidence of arthropathy or other focal bone abnormality. Soft tissues are unremarkable. IMPRESSION: Negative evaluation of the LEFT knee. Electronically Signed   By: Zetta Bills M.D.   On: 08/10/2019 12:59   DG Hip Unilat With Pelvis 2-3 Views Left  Result Date: 08/10/2019 CLINICAL DATA:  Left hip pain EXAM: DG HIP (WITH OR WITHOUT PELVIS) 2-3V LEFT COMPARISON:  None. FINDINGS: No acute fracture. No dislocation. Left hip joint space is maintained. Small conglomeration of ovoid mineralized densities adjacent to the left greater trochanter in total measuring 1.8 x 0.6 cm likely reflecting hydroxyapatite deposition within the gluteal tendons. SI joints and pubic symphysis intact and unremarkable. In the vascular stent and multiple surgical clips noted within the right hemipelvis. IMPRESSION: 1. No acute osseous abnormality. 2. 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 Electronically Signed   By: Davina Poke D.O.   On: 08/10/2019 10:44    Procedures Procedures (including critical care  time)  Medications Ordered in ED Medications - No data to display  ED Course  I have reviewed the triage vital signs and the nursing notes.  Pertinent labs & imaging results that were available during my care of the patient were reviewed by me and considered in my medical decision making (see chart for details).    MDM Rules/Calculators/A&P                     Patient presents to the emergency department with concern for left lower extremity pain from the hip to the knee.  Patient is nontoxic, resting comfortably, vitals WNL with the exception of elevated blood pressure, low suspicion for hypertensive emergency.  Range of motion is preserved, patient is afebrile, there is no warmth or erythema, do not suspect septic joint at this time.  No rash to raise concern for shingles.  X-rays were obtained and negative for fracture or dislocation.  No edema noted, low suspicion for DVT.  Symmetric pulses, doubt arterial emboli.  No neuro deficits to raise concern for spinal cord compression or cauda equina.  Suspect muscular in nature, potentially IT band given distribution. Will trial lidoderm patch & diclofenac gel. Muscle relaxants not given due to history of seizures. He is requesting to follow up with Dr. Ninfa Linden- information will be provided. I discussed results, treatment plan, need for follow-up, and return precautions with the patient. Provided opportunity for questions, patient confirmed understanding and is in agreement with plan.    Final Clinical Impression(s) / ED Diagnoses Final diagnoses:  Left hip pain    Rx / DC Orders ED Discharge Orders         Ordered    lidocaine (LIDODERM) 5 %  Every 24 hours     08/10/19 1328    diclofenac Sodium (VOLTAREN) 1 % GEL  4 times daily PRN     08/10/19 1328           Almond Fitzgibbon, [place] R, PA-C 08/10/19 1332    [place], DO 08/10/19 1456

## 2019-08-10 NOTE — Discharge Instructions (Addendum)
Please read and follow all provided instructions.  You have been seen today for left leg/hip pain.  Tests performed today include: An x-ray of the affected area - does NOT show any broken bones or dislocations.  Vital signs. See below for your results today.   Home care instructions: -- Please apply heat to the effected area to help loosen the muscle, you may also try the attached IT band exercises.  Medications:  -Lidoderm patch: Please apply 1 patch to the area of most significant pain once per day, be sure to remove within 12 hours -Diclofenac gel: Please apply to the affected area 4 times per day as needed for pain. You make take Tylenol per over the counter dosing with these medications.   We have prescribed you new medication(s) today. Discuss the medications prescribed today with your pharmacist as they can have adverse effects and interactions with your other medicines including over the counter and prescribed medications. Seek medical evaluation if you start to experience new or abnormal symptoms after taking one of these medicines, seek care immediately if you start to experience difficulty breathing, feeling of your throat closing, facial swelling, or rash as these could be indications of a more serious allergic reaction   Follow-up instructions: Please follow-up with your primary care provider or the provided orthopedic physician (bone specialist) if you continue to have significant pain in 1 week. In this case you may have a more severe injury that requires further care.   Return instructions:  Please return if your digits or extremity are numb or tingling, appear gray or blue, or you have severe pain  Please return if you have redness, rashes, or fevers.  Please return if you have significant swelling. Please return to the Emergency Department if you experience worsening symptoms.  Please return if you have any other emergent concerns. Additional Information:  Your vital  signs today were: BP (!) 165/121 (BP Location: Right Arm)   Pulse 63   Temp 98.4 F (36.9 C) (Oral)   Resp 16   SpO2 100%  If your blood pressure (BP) was elevated above 135/85 this visit, please have this repeated by your doctor within one month. ---------------

## 2019-08-12 ENCOUNTER — Encounter (HOSPITAL_COMMUNITY): Payer: Self-pay

## 2019-08-12 ENCOUNTER — Observation Stay (HOSPITAL_COMMUNITY)
Admission: EM | Admit: 2019-08-12 | Discharge: 2019-08-13 | Disposition: A | Discharge status: Home / Self Care | Payer: Medicare HMO | Attending: Internal Medicine | Admitting: Internal Medicine

## 2019-08-12 DIAGNOSIS — Q600 Renal agenesis, unilateral: Secondary | ICD-10-CM | POA: Insufficient documentation

## 2019-08-12 DIAGNOSIS — I2510 Atherosclerotic heart disease of native coronary artery without angina pectoris: Secondary | ICD-10-CM | POA: Diagnosis not present

## 2019-08-12 DIAGNOSIS — E7800 Pure hypercholesterolemia, unspecified: Secondary | ICD-10-CM | POA: Diagnosis not present

## 2019-08-12 DIAGNOSIS — Z791 Long term (current) use of non-steroidal anti-inflammatories (NSAID): Secondary | ICD-10-CM | POA: Diagnosis not present

## 2019-08-12 DIAGNOSIS — Z79899 Other long term (current) drug therapy: Secondary | ICD-10-CM | POA: Diagnosis not present

## 2019-08-12 DIAGNOSIS — I132 Hypertensive heart and chronic kidney disease with heart failure and with stage 5 chronic kidney disease, or end stage renal disease: Secondary | ICD-10-CM | POA: Insufficient documentation

## 2019-08-12 DIAGNOSIS — I491 Atrial premature depolarization: Secondary | ICD-10-CM | POA: Diagnosis not present

## 2019-08-12 DIAGNOSIS — M25552 Pain in left hip: Secondary | ICD-10-CM | POA: Diagnosis not present

## 2019-08-12 DIAGNOSIS — N186 End stage renal disease: Secondary | ICD-10-CM

## 2019-08-12 DIAGNOSIS — Z7982 Long term (current) use of aspirin: Secondary | ICD-10-CM | POA: Diagnosis not present

## 2019-08-12 DIAGNOSIS — I428 Other cardiomyopathies: Secondary | ICD-10-CM | POA: Insufficient documentation

## 2019-08-12 DIAGNOSIS — Z992 Dependence on renal dialysis: Secondary | ICD-10-CM | POA: Diagnosis not present

## 2019-08-12 DIAGNOSIS — E875 Hyperkalemia: Secondary | ICD-10-CM | POA: Diagnosis not present

## 2019-08-12 DIAGNOSIS — Z20822 Contact with and (suspected) exposure to covid-19: Secondary | ICD-10-CM | POA: Diagnosis not present

## 2019-08-12 DIAGNOSIS — E785 Hyperlipidemia, unspecified: Secondary | ICD-10-CM | POA: Insufficient documentation

## 2019-08-12 DIAGNOSIS — I480 Paroxysmal atrial fibrillation: Secondary | ICD-10-CM | POA: Diagnosis not present

## 2019-08-12 DIAGNOSIS — Z86718 Personal history of other venous thrombosis and embolism: Secondary | ICD-10-CM | POA: Diagnosis not present

## 2019-08-12 DIAGNOSIS — M109 Gout, unspecified: Secondary | ICD-10-CM | POA: Diagnosis present

## 2019-08-12 DIAGNOSIS — I509 Heart failure, unspecified: Secondary | ICD-10-CM | POA: Insufficient documentation

## 2019-08-12 DIAGNOSIS — R5381 Other malaise: Secondary | ICD-10-CM | POA: Diagnosis not present

## 2019-08-12 DIAGNOSIS — R609 Edema, unspecified: Secondary | ICD-10-CM | POA: Diagnosis not present

## 2019-08-12 DIAGNOSIS — Z940 Kidney transplant status: Secondary | ICD-10-CM | POA: Insufficient documentation

## 2019-08-12 DIAGNOSIS — R52 Pain, unspecified: Secondary | ICD-10-CM | POA: Diagnosis not present

## 2019-08-12 DIAGNOSIS — G40909 Epilepsy, unspecified, not intractable, without status epilepticus: Secondary | ICD-10-CM | POA: Diagnosis not present

## 2019-08-12 DIAGNOSIS — E079 Disorder of thyroid, unspecified: Secondary | ICD-10-CM | POA: Insufficient documentation

## 2019-08-12 DIAGNOSIS — R000 Tachycardia, unspecified: Secondary | ICD-10-CM | POA: Diagnosis not present

## 2019-08-12 DIAGNOSIS — Z03818 Encounter for observation for suspected exposure to other biological agents ruled out: Secondary | ICD-10-CM | POA: Diagnosis not present

## 2019-08-12 DIAGNOSIS — E43 Unspecified severe protein-calorie malnutrition: Secondary | ICD-10-CM | POA: Diagnosis not present

## 2019-08-12 DIAGNOSIS — I4891 Unspecified atrial fibrillation: Secondary | ICD-10-CM | POA: Diagnosis present

## 2019-08-12 LAB — I-STAT CHEM 8, ED
BUN: 96 mg/dL — ABNORMAL HIGH (ref 6–20)
Calcium, Ion: 0.98 mmol/L — ABNORMAL LOW (ref 1.15–1.40)
Chloride: 109 mmol/L (ref 98–111)
Creatinine, Ser: 12.9 mg/dL — ABNORMAL HIGH (ref 0.61–1.24)
Glucose, Bld: 101 mg/dL — ABNORMAL HIGH (ref 70–99)
HCT: 30 % — ABNORMAL LOW (ref 39.0–52.0)
Hemoglobin: 10.2 g/dL — ABNORMAL LOW (ref 13.0–17.0)
Potassium: 6 mmol/L — ABNORMAL HIGH (ref 3.5–5.1)
Sodium: 134 mmol/L — ABNORMAL LOW (ref 135–145)
TCO2: 23 mmol/L (ref 22–32)

## 2019-08-12 LAB — BASIC METABOLIC PANEL
Anion gap: 16 — ABNORMAL HIGH (ref 5–15)
BUN: 81 mg/dL — ABNORMAL HIGH (ref 6–20)
CO2: 18 mmol/L — ABNORMAL LOW (ref 22–32)
Calcium: 8.6 mg/dL — ABNORMAL LOW (ref 8.9–10.3)
Chloride: 104 mmol/L (ref 98–111)
Creatinine, Ser: 11.51 mg/dL — ABNORMAL HIGH (ref 0.61–1.24)
GFR calc Af Amer: 5 mL/min — ABNORMAL LOW (ref >60)
GFR calc non Af Amer: 4 mL/min — ABNORMAL LOW (ref >60)
Glucose, Bld: 104 mg/dL — ABNORMAL HIGH (ref 70–99)
Potassium: 6.1 mmol/L — ABNORMAL HIGH (ref 3.5–5.1)
Sodium: 138 mmol/L (ref 135–145)

## 2019-08-12 MED ORDER — METHOCARBAMOL 500 MG PO TABS
1000.0000 mg | ORAL_TABLET | Freq: Once | ORAL | Status: AC
  Administered 2019-08-13: 1000 mg via ORAL
  Filled 2019-08-12: qty 2

## 2019-08-12 MED ORDER — SODIUM ZIRCONIUM CYCLOSILICATE 10 G PO PACK
10.0000 g | PACK | ORAL | Status: AC
  Administered 2019-08-13: 10 g via ORAL
  Filled 2019-08-12: qty 1

## 2019-08-12 NOTE — ED Provider Notes (Signed)
[facility] EMERGENCY DEPARTMENT Provider Note   CSN: [id number] Arrival date & time: 08/12/19  2041     History Chief Complaint  Patient presents with  . Hip Pain    Peter Sloan is a 59 y.o. male.  HPI  Patient is a 59 year old male with past medical history significant for chronic renal insufficiency on dialysis Tuesday Thursday Saturday with coronary CHF CAD, HTN, HLD, nonischemic cardiomyopathy.   Patient states that he is here because he missed dialysis yesterday as well as because he has left hip pain has been ongoing for several days.  He was evaluated for this in the emergency department 2 days ago and discharged with lidocaine patch and Voltaren gel.  He states that the pain has been so severe that he has been unable to ambulate and missed dialysis a result of this.  He states that he is concerned that his electrolytes to be abnormal because of this.  He denies any chest pain, shortness of breath, nausea, vomiting, diarrhea.  Denies any leg swelling.  He states he still makes urine and has been continue to make urine without difficulty.  No fevers or chills.     Past Medical History:  Diagnosis Date  . Atrial fibrillation ([place])   . Chronic renal insufficiency    2/2 ischemic ATN, GFR 15-17 ml/min, baseline Cr: 4-4.5, never had a renal biopsy, s/p AV fistula placement in 2007, f/b nephrologist, Dr. Dorothe Pea, [place]  . Congenital solitary kidney   . Congestive heart failure ([place])   . End stage renal disease on dialysis [facility])    fistula left arm, dialysis on Tues, Thurs, Sat.  . Gout    h/o: many in Rt big toe and Rt Knee, never had aspirartion diagnosis  . High cholesterol   . Hyperparathyroidism ([place])   . 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 Nonischemic cardiomyopathy ([facility])    EF 20%(9/06) with most recent EF 50%(3/08), valves normal per 2D echo (3/08), normal doppler and color flow study(3/08) s/p endocmyocardial  biopsy: mild myocyte  hypertrophy no myocyte damage or inflammation  . Physical debility   . 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 Seizure disorder ([place])    complex- partial, h/o sz disorder as a child  . Seizures ([place])    last sz 07/05/12 grand mal  . Severe protein-calorie malnutrition ([place])   . Thyroid disorder     Patient Active Problem List   Diagnosis Date Noted  . Hyperkalemia 08/13/2019  . Postoperative anemia due to acute blood loss 11/18/2012  . End stage renal disease on dialysis ([place])   . 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 Severe protein-calorie malnutrition ([place])   . Atrial fibrillation ([employer] [facility])   . Physical debility   . 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 Seizure disorder ([place]) 07/28/2012  . Hypokalemia 01/12/2012  . Hyperlipidemia 01/12/2012  . THROMBOCYTOPENIA 08/17/2008  . GOUT 09/18/2006  . CARDIOMYOPATHY 09/18/2006  . RENAL INSUFFICIENCY, CHRONIC 09/18/2006  . SEIZURE DISORDER 09/18/2006    Past Surgical History:  Procedure Laterality Date  . HERNIA REPAIR    . KIDNEY SURGERY    . KIDNEY TRANSPLANT  08/14/12  . NEPHRECTOMY TRANSPLANTED ORGAN  10/14/12  . TONSILLECTOMY         Family History  Problem Relation Age of Onset  . Osteoarthritis Mother   . Hypertension Father   . Muscular dystrophy Brother   . Heart disease Child  one 28 y/o son has MVP    Social History   Tobacco Use  . Smoking status: Never Smoker  . Smokeless tobacco: Never Used  Substance Use Topics  . Alcohol use: No    Comment: social drinker  . Drug use: No    Home Medications Prior to Admission medications   Medication Sig Start Date End Date Taking? Authorizing Provider  acetaminophen (TYLENOL) 500 MG tablet Take 500 mg by mouth every 6 (six) hours as needed for mild pain.   Yes [provider]  allopurinol (ZYLOPRIM) 300 MG tablet Take 300 mg by mouth daily. 09/29/12  Yes [provider]  amiodarone (PACERONE) 200 MG tablet Take 200 mg by mouth daily.    Yes [provider]  Ascorbic Acid (VITAMIN C) 1000 MG tablet Take 1,000 mg by mouth daily.   Yes [provider]  aspirin 81 MG tablet Take 81 mg by mouth every morning.    Yes [provider]  AURYXIA 1 GM 210 MG(Fe) tablet Take 420 mg by mouth daily as needed for constipation. 06/20/19  Yes [provider]  b complex-vitamin c-folic acid (NEPHRO-VITE) 0.8 MG TABS tablet Take 0.8 mg by mouth daily.    Yes [provider]  diclofenac Sodium (VOLTAREN) 1 % GEL Apply 4 g topically 4 (four) times daily as needed (pain). 08/10/19  Yes Petrucelli, Samantha R, PA-C  divalproex (DEPAKOTE ER) 250 MG 24 hr tablet TAKE 1 TABLET BY MOUTH EVERY DAY 10/06/18  Yes Penumalli, Vikram R, MD  ibuprofen (ADVIL) 200 MG tablet Take 200 mg by mouth every 6 (six) hours as needed for moderate pain.   Yes [provider]  lamoTRIgine (LAMICTAL) 25 MG tablet TAKE 1 TABLET BY MOUTH TWICE A DAY 10/06/18  Yes Penumalli, Vikram R, MD  levETIRAcetam (KEPPRA) 500 MG tablet TAKE 1 TABLET BY MOUTH TWICE A DAY 10/06/18  Yes Penumalli, Vikram R, MD  lidocaine (LIDODERM) 5 % Place 1 patch onto the skin daily. Place 1 patch over your area of most prominent pain once per day.  Remove & Discard patch within 12 hours. 08/10/19  Yes Petrucelli, Samantha R, PA-C  Omega-3 Fatty Acids (FISH OIL) 1000 MG CAPS Take by mouth.   Yes [provider]  SENSIPAR 60 MG tablet Take 180 mg by mouth daily.  07/21/15  Yes [provider]    Allergies    Diphenhydramine hcl  Review of Systems   Review of Systems  Constitutional: Negative for chills and fever.  HENT: Negative for congestion.   Respiratory: Negative for shortness of breath.   Cardiovascular: Negative for chest pain.  Gastrointestinal: Negative for abdominal pain.  Musculoskeletal: Negative for neck pain.       Left hip pain    Physical Exam Updated Vital Signs BP (!) 144/84   Pulse (!) 105   Temp 98.3 F (36.8  C) (Oral)   Resp 16   SpO2 97%   Physical Exam Vitals and nursing note reviewed.  Constitutional:      General: He is not in acute distress.    Comments: Patient appears uncomfortable but in no acute distress.  Pleasant, able to answer questions appropriate follow commands.  HENT:     Head: Normocephalic and atraumatic.     Nose: Nose normal.  Eyes:     General: No scleral icterus. Cardiovascular:     Rate and Rhythm: Normal rate and regular rhythm.     Pulses: Normal pulses.  Heart sounds: Normal heart sounds.     Comments: Fistula in the left forearm with good thrill  Mild tachycardia heart rate 106 on my exam Pulmonary:     Effort: Pulmonary effort is normal. No respiratory distress.     Breath sounds: No wheezing.  Abdominal:     Palpations: Abdomen is soft.     Tenderness: There is no abdominal tenderness.  Musculoskeletal:     Cervical back: Normal range of motion.     Right lower leg: No edema.     Left lower leg: No edema.     Comments: No LE edema  Skin:    General: Skin is warm and dry.     Capillary Refill: Capillary refill takes less than 2 seconds.  Neurological:     Mental Status: He is alert. Mental status is at baseline.  Psychiatric:        Mood and Affect: Mood normal.        Behavior: Behavior normal.     Comments: Patient shows normal behavior and mood however he does seem to have poor judgment and that he does not seem to understand the importance of dialysis.  When I stated my concern that he would not follow-up with dialysis center tomorrow he states "I feel the same way"     ED Results / Procedures / Treatments   Labs (all labs ordered are listed, but only abnormal results are displayed) Labs Reviewed  BASIC METABOLIC PANEL - Abnormal; Notable for the following components:      Result Value   Potassium 6.1 (*)    CO2 18 (*)    Glucose, Bld 104 (*)    BUN 81 (*)    Creatinine, Ser 11.51 (*)    Calcium 8.6 (*)    GFR calc non Af Amer 4  (*)    GFR calc Af Amer 5 (*)    Anion gap 16 (*)    All other components within normal limits  I-STAT CHEM 8, ED - Abnormal; Notable for the following components:   Sodium 134 (*)    Potassium 6.0 (*)    BUN 96 (*)    Creatinine, Ser 12.90 (*)    Glucose, Bld 101 (*)    Calcium, Ion 0.98 (*)    Hemoglobin 10.2 (*)    HCT 30.0 (*)    All other components within normal limits  SARS CORONAVIRUS 2 BY RT PCR (HOSPITAL ORDER, [place] [facility])  CBC WITH DIFFERENTIAL/PLATELET  CBC WITH DIFFERENTIAL/PLATELET    EKG EKG Interpretation  Date/Time:  Wednesday Aug 12 2019 22:54:05 EDT Ventricular Rate:  101 PR Interval:    QRS Duration: 98 QT Interval:  384 QTC Calculation: 498 R Axis:   -7 Text Interpretation: Sinus tachycardia Multiple premature complexes, vent & supraven Abnormal R-wave progression, early transition Borderline prolonged QT interval No STEMI Confirmed by Nanda Quinton [id number]) on 08/12/2019 11:00:09 PM   Radiology No results found.  Procedures .Critical Care Performed by: Tedd Sias, PA Authorized by: Tedd Sias, PA   Critical care provider statement:    Critical care time (minutes):  35   Critical care time was exclusive of:  Separately billable procedures and treating other patients and teaching time   Critical care was necessary to treat or prevent imminent or life-threatening deterioration of the following conditions: hyperkalemia    Critical care was time spent personally by me on the following activities:  Discussions with consultants, evaluation of patient's  response to treatment, examination of patient, review of old charts, re-evaluation of patient's condition, pulse oximetry, ordering and review of radiographic studies, ordering and review of laboratory studies and ordering and performing treatments and interventions   I assumed direction of critical care for this patient from another provider in my specialty: no      (including critical care time)  Medications Ordered in ED Medications  sodium zirconium cyclosilicate (LOKELMA) packet 10 g (has no administration in time range)  methocarbamol (ROBAXIN) tablet 1,000 mg (has no administration in time range)  insulin aspart (novoLOG) injection 5 Units (has no administration in time range)  dextrose 50 % solution 50 mL (has no administration in time range)    ED Course  I have reviewed the triage vital signs and the nursing notes.  Pertinent labs & imaging results that were available during my care of the patient were reviewed by me and considered in my medical decision making (see chart for details).  Patient is a 59 year old gentleman with a history of single kidney who presented today with left hip pain which he was evaluated for 2 days ago and found to have calcific tendinosis and has reassuring physical exam today with muscular tenderness palpation and was found to have hyperkalemia 6.1 which is a result of patient skipping dialysis Tuesday because of his pain.  EKG without signif EKG changes --no QRS normalities.  No QT abnormalities notable.  I will consult nephrology for recommendations for treatment of his hyperkalemia.   Clinical Course as of Aug 13 22  Wed Aug 12, 2019  2339 Discussed with Dr. Zelphia Cairo of nephrology.    [WF]    Clinical Course User Index [WF] Tedd Sias, [place]   Per Dr. Zelphia Cairo recommendations I will provide patient with 10 g of lokelma. Plan is to reconsult nephrology in the morning for dialysis. Plan to DC after.  Patient does not need any further work-up for hip pain.  Doubt occult fracture.  You may be reasonable to trial patient on Robaxin or relaxer at the discretion of the hospitalist team.  I discussed the case with Dr. Marlowe Sax of internal medicine who requested patient be started on insulin and D50.  Will provide patient given 1 amp of D50 and 5 units of NovoLog will recheck CBG in 1 hour.  Patient updated on plan and is  agreeable to this.  MDM Rules/Calculators/A&P                      I discussed this case with my attending physician who cosigned this note including patient's presenting symptoms, physical exam, and planned diagnostics and interventions. Attending physician stated agreement with plan or made changes to plan which were implemented.   Attending physician assessed patient at bedside.  Final Clinical Impression(s) / ED Diagnoses Final diagnoses:  Hyperkalemia    Rx / DC Orders ED Discharge Orders    None       Tedd Sias, [place] 08/13/19 Joycelyn Das, MD 08/13/19 [phone]

## 2019-08-12 NOTE — ED Triage Notes (Signed)
Pt BIB [facility] from home.   Pt endorses left hip pain, tender to the touch, unable to walk on it. No falls or injuries, no neurological deficits, left foot pulse +3.   T,TH,S dialysis. Missed Tuesday due to not being able to walk.   Pt given 14mcg Fentanyl with [facility].

## 2019-08-12 NOTE — ED Notes (Addendum)
Went in to ambulate patient and when trying to move patient says " no no I can't even move my left leg it hurts to much all I can do is lift it with my hand to move it and I don't know why they want me to walk" I tried to ask patient to see if they could move to sit on edge of bed but still refused.

## 2019-08-13 DIAGNOSIS — E875 Hyperkalemia: Secondary | ICD-10-CM | POA: Diagnosis not present

## 2019-08-13 DIAGNOSIS — N186 End stage renal disease: Secondary | ICD-10-CM | POA: Diagnosis not present

## 2019-08-13 DIAGNOSIS — Z992 Dependence on renal dialysis: Secondary | ICD-10-CM

## 2019-08-13 DIAGNOSIS — N2581 Secondary hyperparathyroidism of renal origin: Secondary | ICD-10-CM | POA: Diagnosis not present

## 2019-08-13 DIAGNOSIS — M25552 Pain in left hip: Secondary | ICD-10-CM

## 2019-08-13 LAB — BASIC METABOLIC PANEL
Anion gap: 18 — ABNORMAL HIGH (ref 5–15)
BUN: 91 mg/dL — ABNORMAL HIGH (ref 6–20)
CO2: 18 mmol/L — ABNORMAL LOW (ref 22–32)
Calcium: 9.4 mg/dL (ref 8.9–10.3)
Chloride: 103 mmol/L (ref 98–111)
Creatinine, Ser: 12.92 mg/dL — ABNORMAL HIGH (ref 0.61–1.24)
GFR calc Af Amer: 4 mL/min — ABNORMAL LOW (ref >60)
GFR calc non Af Amer: 4 mL/min — ABNORMAL LOW (ref >60)
Glucose, Bld: 99 mg/dL (ref 70–99)
Potassium: 3.6 mmol/L (ref 3.5–5.1)
Sodium: 139 mmol/L (ref 135–145)

## 2019-08-13 LAB — CBC WITH DIFFERENTIAL/PLATELET
Abs Immature Granulocytes: 0.04 10*3/uL (ref 0.00–0.07)
Basophils Absolute: 0 10*3/uL (ref 0.0–0.1)
Basophils Relative: 0 %
Eosinophils Absolute: 0.1 10*3/uL (ref 0.0–0.5)
Eosinophils Relative: 1 %
HCT: 33.9 % — ABNORMAL LOW (ref 39.0–52.0)
Hemoglobin: 11.6 g/dL — ABNORMAL LOW (ref 13.0–17.0)
Immature Granulocytes: 0 %
Lymphocytes Relative: 15 %
Lymphs Abs: 1.4 10*3/uL (ref 0.7–4.0)
MCH: 34.7 pg — ABNORMAL HIGH (ref 26.0–34.0)
MCHC: 34.2 g/dL (ref 30.0–36.0)
MCV: 101.5 fL — ABNORMAL HIGH (ref 80.0–100.0)
Monocytes Absolute: 1 10*3/uL (ref 0.1–1.0)
Monocytes Relative: 11 %
Neutro Abs: 6.6 10*3/uL (ref 1.7–7.7)
Neutrophils Relative %: 73 %
Platelets: 160 10*3/uL (ref 150–400)
RBC: 3.34 MIL/uL — ABNORMAL LOW (ref 4.22–5.81)
RDW: 14.1 % (ref 11.5–15.5)
WBC: 9.1 10*3/uL (ref 4.0–10.5)
nRBC: 0 % (ref 0.0–0.2)

## 2019-08-13 LAB — HIV ANTIBODY (ROUTINE TESTING W REFLEX): HIV Screen 4th Generation wRfx: NONREACTIVE

## 2019-08-13 LAB — SARS CORONAVIRUS 2 BY RT PCR (HOSPITAL ORDER, PERFORMED IN ~~LOC~~ HOSPITAL LAB): SARS Coronavirus 2: NEGATIVE

## 2019-08-13 LAB — CBG MONITORING, ED: Glucose-Capillary: 117 mg/dL — ABNORMAL HIGH (ref 70–99)

## 2019-08-13 MED ORDER — ACETAMINOPHEN 650 MG RE SUPP: 650.0000 mg | Freq: Four times a day (QID) | RECTAL | Status: DC | PRN

## 2019-08-13 MED ORDER — CINACALCET HCL 30 MG PO TABS
180.0000 mg | ORAL_TABLET | Freq: Every day | ORAL | Status: DC
  Filled 2019-08-13: qty 6

## 2019-08-13 MED ORDER — LIDOCAINE 5 % EX PTCH
1.0000 | MEDICATED_PATCH | CUTANEOUS | Status: DC
  Filled 2019-08-13: qty 1

## 2019-08-13 MED ORDER — ALLOPURINOL 100 MG PO TABS: 300.0000 mg | ORAL_TABLET | Freq: Every day | ORAL | Status: DC

## 2019-08-13 MED ORDER — AMIODARONE HCL 200 MG PO TABS: 200.0000 mg | ORAL_TABLET | Freq: Every day | ORAL | Status: DC

## 2019-08-13 MED ORDER — ACETAMINOPHEN 325 MG PO TABS: 650.0000 mg | ORAL_TABLET | Freq: Four times a day (QID) | ORAL | Status: DC | PRN

## 2019-08-13 MED ORDER — DEXTROSE 50 % IV SOLN
1.0000 | Freq: Once | INTRAVENOUS | Status: AC
  Administered 2019-08-13: 50 mL via INTRAVENOUS
  Filled 2019-08-13: qty 50

## 2019-08-13 MED ORDER — LAMOTRIGINE 25 MG PO TABS: 25.0000 mg | ORAL_TABLET | Freq: Two times a day (BID) | ORAL | Status: DC

## 2019-08-13 MED ORDER — LEVETIRACETAM 500 MG PO TABS
500.0000 mg | ORAL_TABLET | Freq: Two times a day (BID) | ORAL | Status: DC
  Filled 2019-08-13: qty 1

## 2019-08-13 MED ORDER — CYCLOBENZAPRINE HCL 5 MG PO TABS: 5.0000 mg | ORAL_TABLET | Freq: Two times a day (BID) | ORAL | 0 refills | Status: DC | PRN

## 2019-08-13 MED ORDER — HEPARIN SODIUM (PORCINE) 5000 UNIT/ML IJ SOLN: 5000.0000 [IU] | Freq: Three times a day (TID) | INTRAMUSCULAR | Status: DC

## 2019-08-13 MED ORDER — INSULIN ASPART 100 UNIT/ML ~~LOC~~ SOLN
5.0000 [IU] | Freq: Once | SUBCUTANEOUS | Status: AC
  Administered 2019-08-13: 5 [IU] via INTRAVENOUS

## 2019-08-13 MED ORDER — ASPIRIN EC 81 MG PO TBEC: 81.0000 mg | DELAYED_RELEASE_TABLET | Freq: Every day | ORAL | Status: DC

## 2019-08-13 MED ORDER — DICLOFENAC SODIUM 1 % EX GEL: 4.0000 g | Freq: Four times a day (QID) | CUTANEOUS | Status: DC | PRN

## 2019-08-13 MED ORDER — DIVALPROEX SODIUM ER 250 MG PO TB24: 250.0000 mg | ORAL_TABLET | Freq: Every day | ORAL | Status: DC

## 2019-08-13 NOTE — H&P (Signed)
History and Physical    Peter Sloan NUU:[id number] DOB: [birth year] DOA: 08/12/2019  PCP: Mauricia Area, MD Patient coming from: Home  Chief Complaint: Left hip pain, missed hemodialysis  HPI: Peter Sloan is a 59 y.o. male with medical history significant of congenital solitary kidney and ESRD on HD TTS, A. fib, CHF, hyperlipidemia, gout, hypertension, seizure disorder presenting to the ED after missing hemodialysis in the setting of left hip pain.  Patient reports 5-day history of pain in his left hip which is making it difficult for him to walk.  As such, he missed his last hemodialysis session on Tuesday.  Denies any recent falls or injury to his hip.  He has tried lidocaine patches for the pain which help somewhat.  He is also using Voltaren gel.  He has no other complaints.  Denies cough, shortness of breath, chest pain, nausea, vomiting, diarrhea, or abdominal pain.  ED Course: Afebrile.  Slightly tachycardic.  Potassium 6.1.  EKG without acute changes.  Bicarb 18.  Screening SARS-CoV-2 PCR test negative.  Patient was given Lokelma 10 g and NovoLog 5 units/D50.  Nephrology was consulted and is planning on taking the patient for dialysis in the morning.  Review of Systems:  All systems reviewed and apart from history of presenting illness, are negative.  Past Medical History:  Diagnosis Date  . Atrial fibrillation ([place])   . Chronic renal insufficiency    2/2 ischemic ATN, GFR 15-17 ml/min, baseline Cr: 4-4.5, never had a renal biopsy, s/p AV fistula placement in 2007, f/b nephrologist, Dr. Dorothe Pea, [place]  . Congenital solitary kidney   . Congestive heart failure ([place])   . End stage renal disease on dialysis [facility])    fistula left arm, dialysis on Tues, Thurs, Sat.  . Gout    h/o: many in Rt big toe and Rt Knee, never had aspirartion diagnosis  . High cholesterol   . Hyperparathyroidism ([place])   . 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 Nonischemic cardiomyopathy (HCC)     EF 20%(9/06) with most recent EF 50%(3/08), valves normal per 2D echo (3/08), normal doppler and color flow study(3/08) s/p endocmyocardial  biopsy: mild myocyte hypertrophy no myocyte damage or inflammation  . Physical debility   . 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 Seizure disorder ([place])    complex- partial, h/o sz disorder as a child  . Seizures ([place])    last sz 07/05/12 grand mal  . Severe protein-calorie malnutrition ([place])   . Thyroid disorder     Past Surgical History:  Procedure Laterality Date  . HERNIA REPAIR    . KIDNEY SURGERY    . KIDNEY TRANSPLANT  08/14/12  . NEPHRECTOMY TRANSPLANTED ORGAN  10/14/12  . TONSILLECTOMY       reports that he has never smoked. He has never used smokeless tobacco. He reports that he does not drink alcohol or use drugs.  Allergies  Allergen Reactions  . Diphenhydramine Hcl Palpitations    Family History  Problem Relation Age of Onset  . Osteoarthritis Mother   . Hypertension Father   . Muscular dystrophy Brother   . Heart disease Child        one 78 y/o son has MVP    Prior to Admission medications   Medication Sig Start Date End Date Taking? Authorizing Provider  acetaminophen (TYLENOL) 500 MG tablet Take 500 mg by mouth every 6 (six) hours as needed for mild pain.   Yes [provider]  allopurinol (ZYLOPRIM)  300 MG tablet Take 300 mg by mouth daily. 09/29/12  Yes [provider]  amiodarone (PACERONE) 200 MG tablet Take 200 mg by mouth daily.   Yes [provider]  Ascorbic Acid (VITAMIN C) 1000 MG tablet Take 1,000 mg by mouth daily.   Yes [provider]  aspirin 81 MG tablet Take 81 mg by mouth every morning.    Yes [provider]  AURYXIA 1 GM 210 MG(Fe) tablet Take 420 mg by mouth daily as needed for constipation. 06/20/19  Yes [provider]  b complex-vitamin c-folic acid (NEPHRO-VITE) 0.8 MG TABS tablet Take 0.8 mg by mouth daily.    Yes [provider]   diclofenac Sodium (VOLTAREN) 1 % GEL Apply 4 g topically 4 (four) times daily as needed (pain). 08/10/19  Yes Petrucelli, Samantha R, PA-C  divalproex (DEPAKOTE ER) 250 MG 24 hr tablet TAKE 1 TABLET BY MOUTH EVERY DAY 10/06/18  Yes Penumalli, Vikram R, MD  ibuprofen (ADVIL) 200 MG tablet Take 200 mg by mouth every 6 (six) hours as needed for moderate pain.   Yes [provider]  lamoTRIgine (LAMICTAL) 25 MG tablet TAKE 1 TABLET BY MOUTH TWICE A DAY 10/06/18  Yes Penumalli, Vikram R, MD  levETIRAcetam (KEPPRA) 500 MG tablet TAKE 1 TABLET BY MOUTH TWICE A DAY 10/06/18  Yes Penumalli, Vikram R, MD  lidocaine (LIDODERM) 5 % Place 1 patch onto the skin daily. Place 1 patch over your area of most prominent pain once per day.  Remove & Discard patch within 12 hours. 08/10/19  Yes Petrucelli, Samantha R, PA-C  Omega-3 Fatty Acids (FISH OIL) 1000 MG CAPS Take by mouth.   Yes [provider]  SENSIPAR 60 MG tablet Take 180 mg by mouth daily.  07/21/15  Yes [provider]    Physical Exam: Vitals:   08/13/19 0515 08/13/19 0530 08/13/19 0545 08/13/19 0600  BP: 116/76 129/77 128/84 123/69  Pulse: (!) 47 (!) 41 99 91  Resp: (!) 27 (!) 22 (!) 24 (!) 30  Temp:      TempSrc:      SpO2: 97% 98% 96% 98%    Physical Exam  Constitutional: He is oriented to person, place, and time. He appears well-developed and well-nourished. No distress.  HENT:  Head: Normocephalic.  Eyes: Right eye exhibits no discharge. Left eye exhibits no discharge.  Cardiovascular: Normal rate, regular rhythm and intact distal pulses.  Pulmonary/Chest: Effort normal and breath sounds normal. No respiratory distress. He has no wheezes. He has no rales.  Abdominal: Soft. Bowel sounds are normal. He exhibits no distension. There is no abdominal tenderness. There is no guarding.  Musculoskeletal:        General: No edema.     Cervical back: Neck supple.  Neurological: He is alert and oriented to person, place,  and time.  Skin: Skin is warm and dry. He is not diaphoretic.    Labs on Admission: I have personally reviewed following labs and imaging studies  CBC: Recent Labs  Lab 08/12/19 2214 08/12/19 2317  WBC  --  9.1  NEUTROABS  --  6.6  HGB 10.2* 11.6*  HCT 30.0* 33.9*  MCV  --  101.5*  PLT  --  034   Basic Metabolic Panel: Recent Labs  Lab 08/12/19 2214 08/12/19 2229 08/13/19 0426  NA 134* 138 139  K 6.0* 6.1* 3.6  CL 109 104 103  CO2  --  18* 18*  GLUCOSE 101* 104* 99  BUN 96* 81* 91*  CREATININE 12.90* 11.51* 12.92*  CALCIUM  --  8.6* 9.4   GFR: CrCl cannot be calculated (Unknown ideal weight.). Liver Function Tests: No results for input(s): AST, ALT, ALKPHOS, BILITOT, PROT, ALBUMIN in the last 168 hours. No results for input(s): LIPASE, AMYLASE in the last 168 hours. No results for input(s): AMMONIA in the last 168 hours. Coagulation Profile: No results for input(s): INR, PROTIME in the last 168 hours. Cardiac Enzymes: No results for input(s): CKTOTAL, CKMB, CKMBINDEX, TROPONINI in the last 168 hours. BNP (last 3 results) No results for input(s): PROBNP in the last 8760 hours. HbA1C: No results for input(s): HGBA1C in the last 72 hours. CBG: Recent Labs  Lab 08/13/19 0119  GLUCAP 117*   Lipid Profile: No results for input(s): CHOL, HDL, LDLCALC, TRIG, CHOLHDL, LDLDIRECT in the last 72 hours. Thyroid Function Tests: No results for input(s): TSH, T4TOTAL, FREET4, T3FREE, THYROIDAB in the last 72 hours. Anemia Panel: No results for input(s): VITAMINB12, FOLATE, FERRITIN, TIBC, IRON, RETICCTPCT in the last 72 hours. Urine analysis:    Component Value Date/Time   COLORURINE YELLOW 01/13/2012 0946   APPEARANCEUR CLEAR 01/13/2012 0946   LABSPEC 1.019 01/13/2012 0946   PHURINE 8.0 01/13/2012 0946   GLUCOSEU NEGATIVE 01/13/2012 0946   HGBUR NEGATIVE 01/13/2012 0946   BILIRUBINUR NEGATIVE 01/13/2012 0946   KETONESUR NEGATIVE 01/13/2012 0946   PROTEINUR 100  (A) 01/13/2012 0946   UROBILINOGEN 0.2 01/13/2012 0946   NITRITE NEGATIVE 01/13/2012 0946   LEUKOCYTESUR NEGATIVE 01/13/2012 0946    Radiological Exams on Admission: No results found.  EKG: Independently reviewed.  Sinus tachycardia, heart rate 101.  No acute changes.  Assessment/Plan Principal Problem:   Hyperkalemia Active Problems:   GOUT   End stage renal disease on dialysis [facility])   Atrial fibrillation (HCC)   Left hip pain   Hyperkalemia, ESRD and missed HD: Patient missed his last dialysis session on Tuesday due to hip pain.  Potassium 6.1 on labs.  EKG without acute changes.  He has been given medical treatment for hyperkalemia including Lokelma and insulin/D50.  Potassium 3.6 on repeat labs.  Bicarb 18.  Does not appear volume overloaded. -Nephrology is planning on taking him for dialysis in the morning.  Continue home renal supplements.  Left hip pain secondary to calcific tendinopathy: Patient was evaluated in the ED on 5/17 for hip pain and x-ray done at that time was negative for acute osseous abnormality.  Revealed findings suggestive of calcific tendinopathy of the left gluteal tendons. -PT evaluation.  Tylenol as needed for pain, lidocaine patches, and Voltaren gel.  Paroxysmal A. Fib: Currently in sinus rhythm.  Followed by cardiology and not on anticoagulation.  Continue amiodarone.  Seizure disorder: Stable.  Continue Depakote, Keppra, and Lamictal.  Gout: Continue home allopurinol  DVT prophylaxis: Subcutaneous heparin Code Status: Full code Family Communication: No family available at this time. Disposition Plan: Status is: Observation  The patient remains OBS appropriate and will d/c before 2 midnights.  Dispo: The patient is from: Home              Anticipated d/c is to: Home              Anticipated d/c date is: 1 day              Patient currently is not medically stable to d/c.   The medical decision making on this patient was of high complexity  and the patient is at high  risk for clinical deterioration, therefore this is a level 3 visit.  Shela Leff MD [facility]  If 7PM-7AM, please contact night-coverage [email or web address]  08/13/2019, 6:17 AM

## 2019-08-13 NOTE — Discharge Summary (Signed)
Physician Discharge Summary  Peter Sloan FTD:[id number] DOB: 02/14/1961 DOA: 08/12/2019  PCP: Mauricia Area, MD  Admit date: 08/12/2019 Discharge date: 08/13/2019  Admitted From: Home Disposition:  Home  Recommendations for Outpatient Follow-up:  Follow up with PCP in 2-3 weeks  Discharge Condition:Improved CODE STATUS:Full Diet recommendation: Renal   Brief/Interim Summary: 59 y.o. male with medical history significant of congenital solitary kidney and ESRD on HD TTS, A. fib, CHF, hyperlipidemia, gout, hypertension, seizure disorder presenting to the ED after missing hemodialysis in the setting of left hip pain.  Patient reports 5-day history of pain in his left hip which is making it difficult for him to walk.  As such, he missed his last hemodialysis session on Tuesday.  Denies any recent falls or injury to his hip.  He has tried lidocaine patches for the pain which help somewhat.  He is also using Voltaren gel.  He has no other complaints.  Denies cough, shortness of breath, chest pain, nausea, vomiting, diarrhea, or abdominal pain.  ED Course: Afebrile.  Slightly tachycardic.  Potassium 6.1.  EKG without acute changes.  Bicarb 18.  Screening SARS-CoV-2 PCR test negative.  Patient was given Lokelma 10 g and NovoLog 5 units/D50.  Nephrology was consulted   Discharge Diagnoses:  Principal Problem:   Hyperkalemia Active Problems:   GOUT   End stage renal disease on dialysis [facility])   Atrial fibrillation (HCC)   Left hip pain    Hyperkalemia, ESRD and missed HD:  -Patient missed his last dialysis session on Tuesday due to hip pain -Presenting potassium 6.1 on labs.  EKG without acute changes.   -Pt was given lokelma with insulin/D50.  Potassium 3.6 on repeat labs. -discussed case with Nephrology, plan to arrange outpatient HD today. Appreciate SW assistance  Left hip pain secondary to calcific tendinopathy:  -Patient was evaluated in the ED on 5/17 for hip pain and x-ray  done at that time was negative for acute osseous abnormality.   -Radiographic findings suggestive of calcific tendinopathy of the left gluteal tendons. -Pt reported much improvement with muscle relaxant and was able to ambulate to near his baseline with RN in ED -Will give limited quantity of low dose flexeril for LLE pain -Pt was offered PT eval while in ED, however he declined  Paroxysmal A. Fib:  -noted to be in sinus rhythm.   -Pt had been followed by cardiology and not on anticoagulation.   -Continue amiodarone as tolerated  Seizure disorder:  -Had remained stable.   -Continue Depakote, Keppra, and Lamictal as tolerated  Gout:  -Continue home allopurinol as tolerated  Discharge Instructions   Allergies as of 08/13/2019      Reactions   Diphenhydramine Hcl Palpitations      Medication List    TAKE these medications   acetaminophen 500 MG tablet Commonly known as: TYLENOL Take 500 mg by mouth every 6 (six) hours as needed for mild pain.   allopurinol 300 MG tablet Commonly known as: ZYLOPRIM Take 300 mg by mouth daily.   amiodarone 200 MG tablet Commonly known as: PACERONE Take 200 mg by mouth daily.   aspirin 81 MG tablet Take 81 mg by mouth every morning.   Auryxia 1 GM 210 MG(Fe) tablet Generic drug: ferric citrate Take 420 mg by mouth daily as needed for constipation.   b complex-vitamin c-folic acid 0.8 MG Tabs tablet Take 0.8 mg by mouth daily.   cyclobenzaprine 5 MG tablet Commonly known as: FLEXERIL Take 1 tablet (5 mg  total) by mouth 2 (two) times daily as needed for muscle spasms.   diclofenac Sodium 1 % Gel Commonly known as: VOLTAREN Apply 4 g topically 4 (four) times daily as needed (pain).   divalproex 250 MG 24 hr tablet Commonly known as: DEPAKOTE ER TAKE 1 TABLET BY MOUTH EVERY DAY   Fish Oil 1000 MG Caps Take by mouth.   ibuprofen 200 MG tablet Commonly known as: ADVIL Take 200 mg by mouth every 6 (six) hours as needed for  moderate pain.   lamoTRIgine 25 MG tablet Commonly known as: LAMICTAL TAKE 1 TABLET BY MOUTH TWICE A DAY   levETIRAcetam 500 MG tablet Commonly known as: KEPPRA TAKE 1 TABLET BY MOUTH TWICE A DAY   lidocaine 5 % Commonly known as: Lidoderm Place 1 patch onto the skin daily. Place 1 patch over your area of most prominent pain once per day.  Remove & Discard patch within 12 hours.   Sensipar 60 MG tablet Generic drug: cinacalcet Take 180 mg by mouth daily.   vitamin C 1000 MG tablet Take 1,000 mg by mouth daily.      Follow-up Information    Deterding, Jeneen Rinks, MD. Schedule an appointment as soon as possible for a visit in 2 week(s).   Specialty: Nephrology Contact information: [address] [place]  [phone]          Allergies  Allergen Reactions  . Diphenhydramine Hcl Palpitations    Consultations:  Nephrology  Procedures/Studies: DG Knee Complete 4 Views Left  Result Date: 08/10/2019 CLINICAL DATA:  Pain, LEFT knee pain radiating down lateral surface of LEFT leg EXAM: LEFT KNEE - COMPLETE 4+ VIEW COMPARISON:  None FINDINGS: No evidence of fracture, dislocation, or joint effusion. No evidence of arthropathy or other focal bone abnormality. Soft tissues are unremarkable. IMPRESSION: Negative evaluation of the LEFT knee. Electronically Signed   By: Zetta Bills M.D.   On: 08/10/2019 12:59   DG Hip Unilat With Pelvis 2-3 Views Left  Result Date: 08/10/2019 CLINICAL DATA:  Left hip pain EXAM: DG HIP (WITH OR WITHOUT PELVIS) 2-3V LEFT COMPARISON:  None. FINDINGS: No acute fracture. No dislocation. Left hip joint space is maintained. Small conglomeration of ovoid mineralized densities adjacent to the left greater trochanter in total measuring 1.8 x 0.6 cm likely reflecting hydroxyapatite deposition within the gluteal tendons. SI joints and pubic symphysis intact and unremarkable. In the vascular stent and multiple surgical clips noted within the right  hemipelvis. IMPRESSION: 1. No acute osseous abnormality. 2. 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 Electronically Signed   By: Davina Poke D.O.   On: 08/10/2019 10:44     Subjective: Feeling well. Eager to go to HD today  Discharge Exam: Vitals:   08/13/19 0700 08/13/19 0800  BP: 134/84 (!) 141/87  Pulse: 92 100  Resp: (!) 26 (!) 27  Temp:    SpO2: 95% 96%   Vitals:   08/13/19 0545 08/13/19 0600 08/13/19 0700 08/13/19 0800  BP: 128/84 123/69 134/84 (!) 141/87  Pulse: 99 91 92 100  Resp: (!) 24 (!) 30 (!) 26 (!) 27  Temp:      TempSrc:      SpO2: 96% 98% 95% 96%    General: Pt is alert, awake, not in acute distress Cardiovascular: RRR, S1/S2 +, no rubs, no gallops Respiratory: CTA bilaterally, no wheezing, no rhonchi Abdominal: Soft, NT, ND, bowel sounds + Extremities: no edema, no cyanosis   The results of  significant diagnostics from this hospitalization (including imaging, microbiology, ancillary and laboratory) are listed below for reference.     Microbiology: Recent Results (from the past 240 hour(s))  SARS Coronavirus 2 by RT PCR (hospital order, performed in [facility] hospital lab) Nasopharyngeal Nasopharyngeal Swab     Status: None   Collection Time: 08/13/19  1:33 AM   Specimen: Nasopharyngeal Swab  Result Value Ref Range Status   SARS Coronavirus 2 NEGATIVE NEGATIVE Final    Comment: (NOTE) SARS-CoV-2 target nucleic acids are NOT DETECTED. The SARS-CoV-2 RNA is generally detectable in upper and lower respiratory specimens during the acute phase of infection. The lowest concentration of SARS-CoV-2 viral copies this assay can detect is 250 copies / mL. A negative result does not preclude SARS-CoV-2 infection and should not be used as the sole basis for treatment or other patient management decisions.  A negative result may occur with improper specimen collection / handling, submission of specimen other than  nasopharyngeal swab, presence of viral mutation(s) within the areas targeted by this assay, and inadequate number of viral copies (<250 copies / mL). A negative result must be combined with clinical observations, patient history, and epidemiological information. Fact Sheet for Patients:   StrictlyIdeas.no Fact Sheet for Healthcare Providers: BankingDealers.co.za This test is not yet approved or cleared  by the [place] FDA and has been authorized for detection and/or diagnosis of SARS-CoV-2 by FDA under an Emergency Use Authorization (EUA).  This EUA will remain in effect (meaning this test can be used) for the duration of the COVID-19 declaration under Section 564(b)(1) of the Act, 21 U.S.C. section 360bbb-3(b)(1), unless the authorization is terminated or revoked sooner. Performed at [facility], [place] [address] [place], [place] [address]      Labs: BNP (last 3 results) No results for input(s): BNP in the last 8760 hours. Basic Metabolic Panel: Recent Labs  Lab 08/12/19 2214 08/12/19 2229 08/13/19 0426  NA 134* 138 139  K 6.0* 6.1* 3.6  CL 109 104 103  CO2  --  18* 18*  GLUCOSE 101* 104* 99  BUN 96* 81* 91*  CREATININE 12.90* 11.51* 12.92*  CALCIUM  --  8.6* 9.4   Liver Function Tests: No results for input(s): AST, ALT, ALKPHOS, BILITOT, PROT, ALBUMIN in the last 168 hours. No results for input(s): LIPASE, AMYLASE in the last 168 hours. No results for input(s): AMMONIA in the last 168 hours. CBC: Recent Labs  Lab 08/12/19 2214 08/12/19 2317  WBC  --  9.1  NEUTROABS  --  6.6  HGB 10.2* 11.6*  HCT 30.0* 33.9*  MCV  --  101.5*  PLT  --  160   Cardiac Enzymes: No results for input(s): CKTOTAL, CKMB, CKMBINDEX, TROPONINI in the last 168 hours. BNP: Invalid input(s): POCBNP CBG: Recent Labs  Lab 08/13/19 0119  GLUCAP 117*   D-Dimer No results for input(s): DDIMER in the last 72 hours. Hgb A1c No  results for input(s): HGBA1C in the last 72 hours. Lipid Profile No results for input(s): CHOL, HDL, LDLCALC, TRIG, CHOLHDL, LDLDIRECT in the last 72 hours. Thyroid function studies No results for input(s): TSH, T4TOTAL, T3FREE, THYROIDAB in the last 72 hours.  Invalid input(s): FREET3 Anemia work up No results for input(s): VITAMINB12, FOLATE, FERRITIN, TIBC, IRON, RETICCTPCT in the last 72 hours. Urinalysis    Component Value Date/Time   COLORURINE YELLOW 01/13/2012 [place] 01/13/2012 0946   LABSPEC 1.019 01/13/2012 0946   PHURINE 8.0  01/13/2012 0946   GLUCOSEU NEGATIVE 01/13/2012 0946   HGBUR NEGATIVE 01/13/2012 0946   BILIRUBINUR NEGATIVE 01/13/2012 0946   KETONESUR NEGATIVE 01/13/2012 0946   PROTEINUR 100 (A) 01/13/2012 0946   UROBILINOGEN 0.2 01/13/2012 0946   NITRITE NEGATIVE 01/13/2012 0946   LEUKOCYTESUR NEGATIVE 01/13/2012 0946   Sepsis Labs Invalid input(s): PROCALCITONIN,  WBC,  LACTICIDVEN Microbiology Recent Results (from the past 240 hour(s))  SARS Coronavirus 2 by RT PCR (hospital order, performed in [facility] hospital lab) Nasopharyngeal Nasopharyngeal Swab     Status: None   Collection Time: 08/13/19  1:33 AM   Specimen: Nasopharyngeal Swab  Result Value Ref Range Status   SARS Coronavirus 2 NEGATIVE NEGATIVE Final    Comment: (NOTE) SARS-CoV-2 target nucleic acids are NOT DETECTED. The SARS-CoV-2 RNA is generally detectable in upper and lower respiratory specimens during the acute phase of infection. The lowest concentration of SARS-CoV-2 viral copies this assay can detect is 250 copies / mL. A negative result does not preclude SARS-CoV-2 infection and should not be used as the sole basis for treatment or other patient management decisions.  A negative result may occur with improper specimen collection / handling, submission of specimen other than nasopharyngeal swab, presence of viral mutation(s) within the areas targeted by this  assay, and inadequate number of viral copies (<250 copies / mL). A negative result must be combined with clinical observations, patient history, and epidemiological information. Fact Sheet for Patients:   StrictlyIdeas.no Fact Sheet for Healthcare Providers: BankingDealers.co.za This test is not yet approved or cleared  by the [place] FDA and has been authorized for detection and/or diagnosis of SARS-CoV-2 by FDA under an Emergency Use Authorization (EUA).  This EUA will remain in effect (meaning this test can be used) for the duration of the COVID-19 declaration under Section 564(b)(1) of the Act, 21 U.S.C. section 360bbb-3(b)(1), unless the authorization is terminated or revoked sooner. Performed at [facility], [place] [address] [place], [place] [address]    Time spent: 30 min  SIGNED:   Marylu Lund, MD  [facility] 08/13/2019, 9:43 AM  If 7PM-7AM, please contact night-coverage

## 2019-08-13 NOTE — ED Notes (Signed)
RN asked that pt attempt to ambulate without difficulty. Walker provided and pt was able to walk down the hall. He described his pain as shooting from foot to leg when foot was knocked on bed rail.

## 2019-08-13 NOTE — ED Notes (Signed)
Pt headed to dialysis- picked up by sister.

## 2019-08-13 NOTE — Progress Notes (Signed)
Patient has been cleared for discharge by Attending and Nephrologist. Today is his normal HD day. His seat time is 10:45am at [facility], with a 10:25am arrival. Navigator met with patient to discuss and ensure that he has transportation. He states that he typically drives himself, but that he came to ED by ambulance. He states that his sister can pick him up this morning and take him to OP HD clinic. He reports no questions, concerns or needs about this plan. He was pleasant and appreciative. [place] Charge RN aware that patient is on his way from the ED.  Alphonzo Cruise, [place] [facility] Navigator [phone]

## 2019-08-13 NOTE — ED Notes (Signed)
Tele  Breakfast Ordered 

## 2019-08-15 DIAGNOSIS — N186 End stage renal disease: Secondary | ICD-10-CM | POA: Diagnosis not present

## 2019-08-15 DIAGNOSIS — N2581 Secondary hyperparathyroidism of renal origin: Secondary | ICD-10-CM | POA: Diagnosis not present

## 2019-08-15 DIAGNOSIS — Z992 Dependence on renal dialysis: Secondary | ICD-10-CM | POA: Diagnosis not present

## 2019-08-18 DIAGNOSIS — N186 End stage renal disease: Secondary | ICD-10-CM | POA: Diagnosis not present

## 2019-08-18 DIAGNOSIS — N2581 Secondary hyperparathyroidism of renal origin: Secondary | ICD-10-CM | POA: Diagnosis not present

## 2019-08-18 DIAGNOSIS — Z992 Dependence on renal dialysis: Secondary | ICD-10-CM | POA: Diagnosis not present

## 2019-08-20 DIAGNOSIS — N2581 Secondary hyperparathyroidism of renal origin: Secondary | ICD-10-CM | POA: Diagnosis not present

## 2019-08-20 DIAGNOSIS — Z992 Dependence on renal dialysis: Secondary | ICD-10-CM | POA: Diagnosis not present

## 2019-08-20 DIAGNOSIS — N186 End stage renal disease: Secondary | ICD-10-CM | POA: Diagnosis not present

## 2019-08-22 DIAGNOSIS — N2581 Secondary hyperparathyroidism of renal origin: Secondary | ICD-10-CM | POA: Diagnosis not present

## 2019-08-22 DIAGNOSIS — N186 End stage renal disease: Secondary | ICD-10-CM | POA: Diagnosis not present

## 2019-08-22 DIAGNOSIS — Z992 Dependence on renal dialysis: Secondary | ICD-10-CM | POA: Diagnosis not present

## 2019-08-24 DIAGNOSIS — Z992 Dependence on renal dialysis: Secondary | ICD-10-CM | POA: Diagnosis not present

## 2019-08-24 DIAGNOSIS — I120 Hypertensive chronic kidney disease with stage 5 chronic kidney disease or end stage renal disease: Secondary | ICD-10-CM | POA: Diagnosis not present

## 2019-08-24 DIAGNOSIS — N186 End stage renal disease: Secondary | ICD-10-CM | POA: Diagnosis not present

## 2019-08-25 DIAGNOSIS — N2581 Secondary hyperparathyroidism of renal origin: Secondary | ICD-10-CM | POA: Diagnosis not present

## 2019-08-25 DIAGNOSIS — Z992 Dependence on renal dialysis: Secondary | ICD-10-CM | POA: Diagnosis not present

## 2019-08-25 DIAGNOSIS — N186 End stage renal disease: Secondary | ICD-10-CM | POA: Diagnosis not present

## 2019-08-27 DIAGNOSIS — Z992 Dependence on renal dialysis: Secondary | ICD-10-CM | POA: Diagnosis not present

## 2019-08-27 DIAGNOSIS — N186 End stage renal disease: Secondary | ICD-10-CM | POA: Diagnosis not present

## 2019-08-27 DIAGNOSIS — N2581 Secondary hyperparathyroidism of renal origin: Secondary | ICD-10-CM | POA: Diagnosis not present

## 2019-08-29 DIAGNOSIS — N186 End stage renal disease: Secondary | ICD-10-CM | POA: Diagnosis not present

## 2019-08-29 DIAGNOSIS — Z992 Dependence on renal dialysis: Secondary | ICD-10-CM | POA: Diagnosis not present

## 2019-08-29 DIAGNOSIS — N2581 Secondary hyperparathyroidism of renal origin: Secondary | ICD-10-CM | POA: Diagnosis not present

## 2019-08-30 ENCOUNTER — Other Ambulatory Visit: Payer: Self-pay | Admitting: Diagnostic Neuroimaging

## 2019-09-01 DIAGNOSIS — N186 End stage renal disease: Secondary | ICD-10-CM | POA: Diagnosis not present

## 2019-09-01 DIAGNOSIS — Z992 Dependence on renal dialysis: Secondary | ICD-10-CM | POA: Diagnosis not present

## 2019-09-01 DIAGNOSIS — N2581 Secondary hyperparathyroidism of renal origin: Secondary | ICD-10-CM | POA: Diagnosis not present

## 2019-09-03 DIAGNOSIS — N2581 Secondary hyperparathyroidism of renal origin: Secondary | ICD-10-CM | POA: Diagnosis not present

## 2019-09-03 DIAGNOSIS — N186 End stage renal disease: Secondary | ICD-10-CM | POA: Diagnosis not present

## 2019-09-03 DIAGNOSIS — Z992 Dependence on renal dialysis: Secondary | ICD-10-CM | POA: Diagnosis not present

## 2019-09-05 DIAGNOSIS — Z992 Dependence on renal dialysis: Secondary | ICD-10-CM | POA: Diagnosis not present

## 2019-09-05 DIAGNOSIS — N186 End stage renal disease: Secondary | ICD-10-CM | POA: Diagnosis not present

## 2019-09-05 DIAGNOSIS — N2581 Secondary hyperparathyroidism of renal origin: Secondary | ICD-10-CM | POA: Diagnosis not present

## 2019-09-08 DIAGNOSIS — N2581 Secondary hyperparathyroidism of renal origin: Secondary | ICD-10-CM | POA: Diagnosis not present

## 2019-09-08 DIAGNOSIS — N186 End stage renal disease: Secondary | ICD-10-CM | POA: Diagnosis not present

## 2019-09-08 DIAGNOSIS — Z992 Dependence on renal dialysis: Secondary | ICD-10-CM | POA: Diagnosis not present

## 2019-09-09 DIAGNOSIS — N186 End stage renal disease: Secondary | ICD-10-CM | POA: Diagnosis not present

## 2019-09-09 DIAGNOSIS — I2510 Atherosclerotic heart disease of native coronary artery without angina pectoris: Secondary | ICD-10-CM | POA: Diagnosis not present

## 2019-09-09 DIAGNOSIS — I425 Other restrictive cardiomyopathy: Secondary | ICD-10-CM | POA: Diagnosis not present

## 2019-09-09 DIAGNOSIS — E7849 Other hyperlipidemia: Secondary | ICD-10-CM | POA: Diagnosis not present

## 2019-09-10 DIAGNOSIS — N186 End stage renal disease: Secondary | ICD-10-CM | POA: Diagnosis not present

## 2019-09-10 DIAGNOSIS — Z992 Dependence on renal dialysis: Secondary | ICD-10-CM | POA: Diagnosis not present

## 2019-09-10 DIAGNOSIS — N2581 Secondary hyperparathyroidism of renal origin: Secondary | ICD-10-CM | POA: Diagnosis not present

## 2019-09-12 DIAGNOSIS — Z992 Dependence on renal dialysis: Secondary | ICD-10-CM | POA: Diagnosis not present

## 2019-09-12 DIAGNOSIS — N186 End stage renal disease: Secondary | ICD-10-CM | POA: Diagnosis not present

## 2019-09-12 DIAGNOSIS — N2581 Secondary hyperparathyroidism of renal origin: Secondary | ICD-10-CM | POA: Diagnosis not present

## 2019-09-15 DIAGNOSIS — N186 End stage renal disease: Secondary | ICD-10-CM | POA: Diagnosis not present

## 2019-09-15 DIAGNOSIS — Z992 Dependence on renal dialysis: Secondary | ICD-10-CM | POA: Diagnosis not present

## 2019-09-15 DIAGNOSIS — N2581 Secondary hyperparathyroidism of renal origin: Secondary | ICD-10-CM | POA: Diagnosis not present

## 2019-09-17 DIAGNOSIS — N2581 Secondary hyperparathyroidism of renal origin: Secondary | ICD-10-CM | POA: Diagnosis not present

## 2019-09-17 DIAGNOSIS — Z992 Dependence on renal dialysis: Secondary | ICD-10-CM | POA: Diagnosis not present

## 2019-09-17 DIAGNOSIS — N186 End stage renal disease: Secondary | ICD-10-CM | POA: Diagnosis not present

## 2019-09-19 DIAGNOSIS — Z992 Dependence on renal dialysis: Secondary | ICD-10-CM | POA: Diagnosis not present

## 2019-09-19 DIAGNOSIS — N2581 Secondary hyperparathyroidism of renal origin: Secondary | ICD-10-CM | POA: Diagnosis not present

## 2019-09-19 DIAGNOSIS — N186 End stage renal disease: Secondary | ICD-10-CM | POA: Diagnosis not present

## 2019-09-22 DIAGNOSIS — Z992 Dependence on renal dialysis: Secondary | ICD-10-CM | POA: Diagnosis not present

## 2019-09-22 DIAGNOSIS — N2581 Secondary hyperparathyroidism of renal origin: Secondary | ICD-10-CM | POA: Diagnosis not present

## 2019-09-22 DIAGNOSIS — N186 End stage renal disease: Secondary | ICD-10-CM | POA: Diagnosis not present

## 2019-09-23 ENCOUNTER — Ambulatory Visit (INDEPENDENT_AMBULATORY_CARE_PROVIDER_SITE_OTHER): Payer: Medicare HMO | Admitting: Family Medicine

## 2019-09-23 ENCOUNTER — Encounter: Payer: Self-pay | Admitting: Family Medicine

## 2019-09-23 VITALS — BP 128/82 | HR 99 | Ht 64.0 in | Wt 154.6 lb

## 2019-09-23 DIAGNOSIS — I4891 Unspecified atrial fibrillation: Secondary | ICD-10-CM

## 2019-09-23 DIAGNOSIS — I120 Hypertensive chronic kidney disease with stage 5 chronic kidney disease or end stage renal disease: Secondary | ICD-10-CM | POA: Diagnosis not present

## 2019-09-23 DIAGNOSIS — G40909 Epilepsy, unspecified, not intractable, without status epilepticus: Secondary | ICD-10-CM

## 2019-09-23 DIAGNOSIS — Z992 Dependence on renal dialysis: Secondary | ICD-10-CM | POA: Diagnosis not present

## 2019-09-23 DIAGNOSIS — N186 End stage renal disease: Secondary | ICD-10-CM | POA: Diagnosis not present

## 2019-09-23 MED ORDER — LEVETIRACETAM 500 MG PO TABS: 500.0000 mg | ORAL_TABLET | Freq: Two times a day (BID) | ORAL | 4 refills | Status: DC

## 2019-09-23 MED ORDER — LAMOTRIGINE 25 MG PO TABS: 25.0000 mg | ORAL_TABLET | Freq: Two times a day (BID) | ORAL | 4 refills | Status: DC

## 2019-09-23 MED ORDER — DIVALPROEX SODIUM ER 250 MG PO TB24: 250.0000 mg | ORAL_TABLET | Freq: Every day | ORAL | 4 refills | Status: DC

## 2019-09-23 NOTE — Patient Instructions (Signed)
We will continue levetiracetam 500mg  twice daily, lamotrigine 25mg  twice daily and divalproex ER 250mg  daily.   Stay well hydrated, eat a well balanced diet and get regular exercise.   Follow up in 1 year   Seizure, Adult A seizure is a sudden burst of abnormal electrical activity in the brain. Seizures usually last from 30 seconds to 2 minutes. They can cause many different symptoms. Usually, seizures are not harmful unless they last a long time. What are the causes? Common causes of this condition include:  Fever or infection.  Conditions that affect the brain, such as: ? A brain abnormality that you were born with. ? A brain or head injury. ? Bleeding in the brain. ? A tumor. ? Stroke. ? Brain disorders such as autism or cerebral palsy.  Low blood sugar.  Conditions that are passed from parent to child (are inherited).  Problems with substances, such as: ? Having a reaction to a drug or a medicine. ? Suddenly stopping the use of a substance (withdrawal). In some cases, the cause may not be known. A person who has repeated seizures over time without a clear cause has a condition called epilepsy. What increases the risk? You are more likely to get this condition if you have:  A family history of epilepsy.  Had a seizure in the past.  A brain disorder.  A history of head injury, lack of oxygen at birth, or strokes. What are the signs or symptoms? There are many types of seizures. The symptoms vary depending on the type of seizure you have. Examples of symptoms during a seizure include:  Shaking (convulsions).  Stiffness in the body.  Passing out (losing consciousness).  Head nodding.  Staring.  Not responding to sound or touch.  Loss of bladder control and bowel control. Some people have symptoms right before and right after a seizure happens. Symptoms before a seizure may include:  Fear.  Worry (anxiety).  Feeling like you may vomit  (nauseous).  Feeling like the room is spinning (vertigo).  Feeling like you saw or heard something before (dj vu).  Odd tastes or smells.  Changes in how you see. You may see flashing lights or spots. Symptoms after a seizure happens can include:  Confusion.  Sleepiness.  Headache.  Weakness on one side of the body. How is this treated? Most seizures will stop on their own in under 5 minutes. In these cases, no treatment is needed. Seizures that last longer than 5 minutes will usually need treatment. Treatment can include:  Medicines given through an IV tube.  Avoiding things that are known to cause your seizures. These can include medicines that you take for another condition.  Medicines to treat epilepsy.  Surgery to stop the seizures. This may be needed if medicines do not help. Follow these instructions at home: Medicines  Take over-the-counter and prescription medicines only as told by your doctor.  Do not eat or drink anything that may keep your medicine from working, such as alcohol. Activity  Do not do any activities that would be dangerous if you had another seizure, like driving or swimming. Wait until your doctor says it is safe for you to do them.  If you live in the [place] ask your local DMV (department of motor vehicles) when you can drive.  Get plenty of rest. Teaching others Teach friends and family what to do when you have a seizure. They should:  Lay you on the ground.  Protect your head  and body.  Loosen any tight clothing around your neck.  Turn you on your side.  Not hold you down.  Not put anything into your mouth.  Know whether or not you need emergency care.  Stay with you until you are better.  General instructions  Contact your doctor each time you have a seizure.  Avoid anything that gives you seizures.  Keep a seizure diary. Write down: ? What you think caused each seizure. ? What you remember about each seizure.  Keep  all follow-up visits as told by your doctor. This is important. Contact a doctor if:  You have another seizure.  You have seizures more often.  There is any change in what happens during your seizures.  You keep having seizures with treatment.  You have symptoms of being sick or having an infection. Get help right away if:  You have a seizure that: ? Lasts longer than 5 minutes. ? Is different than seizures you had before. ? Makes it harder to breathe. ? Happens after you hurt your head.  You have any of these symptoms after a seizure: ? Not being able to speak. ? Not being able to use a part of your body. ? Confusion. ? A bad headache.  You have two or more seizures in a row.  You do not wake up right after a seizure.  You get hurt during a seizure. These symptoms may be an emergency. Do not wait to see if the symptoms will go away. Get medical help right away. Call your local emergency services (911 in the [place] Do not drive yourself to the hospital. Summary  Seizures usually last from 30 seconds to 2 minutes. Usually, they are not harmful unless they last a long time.  Do not eat or drink anything that may keep your medicine from working, such as alcohol.  Teach friends and family what to do when you have a seizure.  Contact your doctor each time you have a seizure. This information is not intended to replace advice given to you by your health care provider. Make sure you discuss any questions you have with your health care provider. Document Revised: 05/30/2018 Document Reviewed: 05/30/2018 Elsevier Patient Education  [place].

## 2019-09-23 NOTE — Progress Notes (Signed)
PATIENT: Peter Sloan DOB: 02-06-1961  REASON FOR VISIT: follow up HISTORY FROM: patient  Chief Complaint  Patient presents with   Follow-up    1 yr f/u for seizures, states he has not had any seizures. Denies any new symptoms.    treatment room    alone     HISTORY OF PRESENT ILLNESS: Today 09/23/19 Peter Sloan is a 59 y.o. male here today for follow up for seizures. He continues levetiracetam 500mg  BID, lamotrigine 25mg  BID and divalproex ER 250mg  daily. He is doing well and tolerating medications with no adverse effects. Last seizure in 2014. He recently lost his brother and father but feels he is holding up well. He lives with his wife and son. He is driving without difficulty. He continues dialysis on Tu, Th and Sat.   HISTORY: (copied from my note on 09/22/2018)  Peter Sloan is a 59 y.o. male here today for follow up for seizure. He continues levetiracetam 500mg  twice daily, lamtrogine 25mg  twice daily and divalproex 250mg  twice daily. He is doing very well without adverse effects. He continues dialysis on Tu, Th and Sa. 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 Dry weight 70.5KG.    History (copied from Dr Gladstone Lighter note on 09/18/2017)  UPDATE (09/18/17, VRP): Since last visit, doingwell. Toleratingmeds. No alleviating or aggravating factors.No seizures.  UPDATE 09/17/16: Since last visit, tolerating meds. No seizures. Notes some changes in urination related to his keppra medication.   UPDATE 08/17/15: Since last visit, no seizures. Tolerating medications.   UPDATE 08/16/14: Since last visit, doing well. No seizures. Tolerating meds.   UPDATE 02/10/14: Since last visit, doing well. No tremors. Hair is growing back thicker. No seizures. Tolerating meds.  UPDATE 08/07/13 (LL): Since last visit no further seizures, doing well. No noted tremors. On depakote ER 250mg  daily, lamotrigine 25mg  BID, and levetiracetam 250mg  BID. Since  Depakote dose has been reduced his hair has returned. iPTH level rose from 126 to 927 since Dec 2014. No new complaints.   UPDATE 06/05/13 (LL): Since last visit, no further seizures. Tremors are gone. On depakote ER 250mg  BID, lamotrigine 25mg  BID, and levetiracetam 250mg  BID. Exercising daily at the [facility], feels good.   UPDATE 01/05/13 (VP): Since last visit, no further seizures. Kidney transplant failed unfortunately. Tremors are better. On depakote ER 250mg  BID, lamotrigine 25mg  BID, and levetiracetam 250mg  BID.   UPDATE 09/16/12: Patient had kidney transplant on Aug 14, 2012. Hospital admission at [facility] was 10 days. Ureter stent was placed during transplant surgery which had to be replaced and prolonged the admission. Discharged with foley catheter, he could not void in 6 hours so foley was replaced. He currently has foley. Kidney is functioning, no more HD needed. He is on week 3 of 6 weeks of in-home therapy. Ambulates with walker but needed wheelchair for long distances. Has had 9 falls in 12 days. Appears very deconditioned. Tremors are worse since surgery, affecting writing, feeding self. Currently on VPA 1000mg  BID and LTG 25mg  BID   UPDATE 07/28/12: Since last visit, had breakthrough sz after dialysis session (told me that double volume was removed inadvertantly). Since then I re-adjusted his meds to VPA 1000mg  BID and LTG 50mg  BID. Now in review of records, I think he may still be on 25mg  BID, but he is not sure.   UPDATE 05/28/12: Since last visit, no sz. Tolerating meds. No new events.   UPDATE 02/20/12: Since last visit, now  on dialysis. Also, had breakthrough sz on 01/11/12 (admitted x 1 day). Now on incr VPA 750mg  BID and continues on LTG 25mg  BID. No triggering factors for breakthrough sz.   UPDATE 06/26/11: Last convulsive seizure was in February 2012. Most recent abnormal spell was 05/05/2011, following a stress test. He had been laying down at home, felt a abnormal sensation in  the left occipital region with a pop sensation, followed by a rush sensation in his face down to his stomach with a sour stomach sensation. He thinks this may have been a petit mall seizure. He was fully aware and conscious during the episode. Episode was similar but somewhat different than his prior episodes. In response to this event he was started on low dose Lamictal 25 mg twice a day. Since that time he has had no further events.   PRIOR HPI: 27 -year-old, right handed, Caucasian male who returns today for followup for history of longstanding seizure disorder. He has a single kidney with renal dysfunction. 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 He has never required dialysis however he is on the transplant list at [facility]. His last major seizure was 05/15/10 and seen in the ER. No loss of B/B, did not bite his tongue. He has had a total of 2 seizures over the last few years. He has had problems with thrombocytopenia in the past . His Depakote dose was reduced and he was seen by a hematologist. The platelet count has returned to normal. He has history of HTN, atrial fib, hypercholesterolemia , and renal failure. Recent labs reviewed from Dr. Deterding's office. Lamictal added at last visit, low dose. No further seizure activity. See ROS.    REVIEW OF SYSTEMS: Out of a complete 14 system review of symptoms, the patient complains only of the following symptoms, none and all other reviewed systems are negative.  ALLERGIES: Allergies  Allergen Reactions   Diphenhydramine Hcl Palpitations    HOME MEDICATIONS: Outpatient Medications Prior to Visit  Medication Sig Dispense Refill   acetaminophen (TYLENOL) 500 MG tablet Take 500 mg by mouth every 6 (six) hours as needed for mild pain.     allopurinol (ZYLOPRIM) 300 MG tablet Take 300 mg by mouth daily.     amiodarone (PACERONE) 200 MG tablet Take 200 mg by mouth daily.     Ascorbic Acid (VITAMIN C) 1000 MG tablet Take 1,000 mg by  mouth daily.     aspirin 81 MG tablet Take 81 mg by mouth every morning.      AURYXIA 1 GM 210 MG(Fe) tablet Take 420 mg by mouth daily as needed for constipation.     b complex-vitamin c-folic acid (NEPHRO-VITE) 0.8 MG TABS tablet Take 0.8 mg by mouth daily.      Omega-3 Fatty Acids (FISH OIL) 1000 MG CAPS Take by mouth.     SENSIPAR 60 MG tablet Take 180 mg by mouth daily.   3   divalproex (DEPAKOTE ER) 250 MG 24 hr tablet TAKE 1 TABLET BY MOUTH EVERY DAY 90 tablet 4   lamoTRIgine (LAMICTAL) 25 MG tablet TAKE 1 TABLET BY MOUTH TWICE A DAY 180 tablet 4   levETIRAcetam (KEPPRA) 500 MG tablet TAKE 1 TABLET BY MOUTH TWICE A DAY 180 tablet 4   cyclobenzaprine (FLEXERIL) 5 MG tablet Take 1 tablet (5 mg total) by mouth 2 (two) times daily as needed for muscle spasms. 20 tablet 0   diclofenac Sodium (VOLTAREN) 1 % GEL Apply 4 g topically  4 (four) times daily as needed (pain). 50 g 0   ibuprofen (ADVIL) 200 MG tablet Take 200 mg by mouth every 6 (six) hours as needed for moderate pain.     lidocaine (LIDODERM) 5 % Place 1 patch onto the skin daily. Place 1 patch over your area of most prominent pain once per day.  Remove & Discard patch within 12 hours. 30 patch 0   No facility-administered medications prior to visit.    PAST MEDICAL HISTORY: Past Medical History:  Diagnosis Date   Atrial fibrillation ([place])    Chronic renal insufficiency    2/2 ischemic ATN, GFR 15-17 ml/min, baseline Cr: 4-4.5, never had a renal biopsy, s/p AV fistula placement in 2007, f/b nephrologist, Dr. Dorothe Pea, [place]   Congenital solitary kidney    Congestive heart failure ([place])    End stage renal disease on dialysis [facility])    fistula left arm, dialysis on Tues, Thurs, Sat.   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   Seizure disorder ([place])    complex- partial, h/o sz disorder as a child   Seizures ([place])    last sz 07/05/12 grand mal   Severe protein-calorie malnutrition ([place])    Thyroid disorder     PAST SURGICAL HISTORY: Past Surgical History:  Procedure Laterality Date   HERNIA REPAIR     KIDNEY SURGERY     KIDNEY TRANSPLANT  08/14/12   NEPHRECTOMY TRANSPLANTED ORGAN  10/14/12   TONSILLECTOMY      FAMILY HISTORY: Family History  Problem Relation Age of Onset   Osteoarthritis Mother    Hypertension Father    Muscular dystrophy Brother    Heart disease Child        one 64 y/o son has MVP    SOCIAL HISTORY: Social History   Socioeconomic History   Marital status: Married    Spouse name: Lattie Haw   Number of children: 1   Years of education: HS   Highest education level: Not on file  Occupational History   Occupation: Currently unemployed    Employer: DISABLED    Comment: 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   Lives with parents in [place].   Has been married for 74yrs.                  Social Determinants of Health   Financial Resource Strain:    Difficulty of Paying Living Expenses:   Food Insecurity:    Worried About Charity fundraiser in the Last Year:    Arboriculturist in the Last Year:   Transportation Needs:  Lack of Transportation (Medical):     Lack of Transportation (Non-Medical):   Physical Activity:    Days of Exercise per Week:    Minutes of Exercise per Session:   Stress:    Feeling of Stress :   Social Connections:    Frequency of Communication with Friends and Family:    Frequency of Social Gatherings with Friends and Family:    Attends Religious Services:    Active Member of Clubs or Organizations:    Attends Archivist Meetings:    Marital Status:   Intimate Partner Violence:    Fear of Current or Ex-Partner:    Emotionally Abused:    Physically Abused:    Sexually Abused:       PHYSICAL EXAM  Vitals:   09/23/19 1340  BP: 128/82  Pulse: 99  Weight: 154 lb 9.6 oz (70.1 kg)  Height: 5\' 4"  (1.626 m)   Body mass index is 26.54 kg/m.  Generalized: Well developed, in no acute distress  Cardiology: irregularly, irregular rhythm  Respiratory: clear to auscultation bilaterally  Neurological examination  Mentation: Alert oriented to time, place, history taking. Follows all commands speech and language fluent Cranial nerve II-XII: Pupils were equal round reactive to light. Extraocular movements were full, visual field were full on confrontational test. Facial sensation and strength were normal. Uvula tongue midline. Head turning and shoulder shrug  were normal and symmetric. Motor: The motor testing reveals 5 over 5 strength of all 4 extremities. Good symmetric motor tone is noted throughout.  Sensory: Sensory testing is intact to soft touch on all 4 extremities. No evidence of extinction is noted.  Coordination: Cerebellar testing reveals good finger-nose-finger and heel-to-shin bilaterally.  Gait and station: Gait is normal. Tandem gait is normal.   DIAGNOSTIC DATA (LABS, IMAGING, TESTING) - I reviewed patient records, labs, notes, testing and imaging myself where available.  No flowsheet data found.   Lab Results  Component Value Date   WBC 9.1 08/12/2019   HGB 11.6 (L) 08/12/2019    HCT 33.9 (L) 08/12/2019   MCV 101.5 (H) 08/12/2019   PLT 160 08/12/2019      Component Value Date/Time   NA 139 08/13/2019 0426   NA 147 (H) 09/09/2008 1450   K 3.6 08/13/2019 0426   K 5.0 (H) 09/09/2008 1450   CL 103 08/13/2019 0426   CL 111 (H) 09/09/2008 1450   CO2 18 (L) 08/13/2019 0426   CO2 26 09/09/2008 1450   GLUCOSE 99 08/13/2019 0426   GLUCOSE 106 09/09/2008 1450   BUN 91 (H) 08/13/2019 0426   BUN 51 (H) 09/09/2008 1450   CREATININE 12.92 (H) 08/13/2019 0426   CREATININE 4.7 (H) 09/09/2008 1450   CALCIUM 9.4 08/13/2019 0426   CALCIUM 10.8 (H) 09/09/2008 1450   PROT 4.7 (L) 09/16/2012 1453   PROT 6.4 09/09/2008 1450   ALBUMIN 3.2 (L) 09/16/2012 1453   AST 27 09/16/2012 1453   AST 24 09/09/2008 1450   ALT 45 (H) 09/16/2012 1453   ALT 21 09/09/2008 1450   ALKPHOS 68 09/16/2012 1453   ALKPHOS 38 09/09/2008 1450   BILITOT 0.3 09/16/2012 1453   BILITOT 0.70 09/09/2008 1450   GFRNONAA 4 (L) 08/13/2019 0426   GFRAA 4 (L) 08/13/2019 0426   Lab Results  Component Value Date   CHOL 171 08/17/2008   HDL 41 08/17/2008   LDLCALC 107 (H) 08/17/2008   TRIG 115 08/17/2008   CHOLHDL 4.2 Ratio 08/17/2008   No results  found for: HGBA1C No results found for: VITAMINB12 Lab Results  Component Value Date   TSH 2.046 01/13/2012       ASSESSMENT AND PLAN 59 y.o. 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 here with     ICD-10-CM   1. Seizure disorder ([place])  G40.909 Valproic acid level    Lamotrigine level    Hepatic Function Panel   2. Atrial fibrillation, unspecified type ([place])  I48.91     Tarius is doing well today. He continues levetiracetam 500mg  BID, lamortigine 25mg  BID and divalproex ER 250mg  at bedtime. He was advised to continue current regimen. We will update labs. Orders placed today and will be drawn at dialysis. He was encouraged to stay well hydrated, eat a well balanced diet and stay active. He will follow up with me in 1 year, sooner if needed. He verbalizes understanding and agreement with this plan.    Orders Placed This Encounter  Procedures   Valproic acid level    Standing Status:   Future    Standing Expiration Date:   09/22/2020   Lamotrigine level    Standing Status:   Future    Standing Expiration Date:   09/22/2020   Hepatic Function Panel    Standing Status:   Future    Standing Expiration Date:   09/22/2020     Meds ordered this encounter  Medications   levETIRAcetam (KEPPRA) 500 MG tablet    Sig: Take 1 tablet (500 mg total) by mouth 2 (two) times daily.    Dispense:  180 tablet    Refill:  4    Order Specific Question:   Supervising Provider    Answer:   Melvenia Beam [[id number]]   divalproex (DEPAKOTE ER) 250 MG 24 hr tablet    Sig: Take 1 tablet (250 mg total) by mouth daily.    Dispense:  90 tablet    Refill:  4    Order Specific Question:   Supervising Provider    Answer:   Melvenia Beam [[id number]]   lamoTRIgine (LAMICTAL) 25 MG tablet    Sig: Take 1 tablet (25 mg total) by mouth 2 (two) times daily.    Dispense:  180 tablet    Refill:  4    Order Specific Question:   Supervising Provider    Answer:   Melvenia Beam [id number]      I spent 15 minutes with the patient. 50% of this time was spent counseling and educating patient on plan of care and medications.    Debbora Presto, FNP-C 09/23/2019, 2:27 PM [facility] [address], [place], [place] [address] [phone]

## 2019-09-24 DIAGNOSIS — N186 End stage renal disease: Secondary | ICD-10-CM | POA: Diagnosis not present

## 2019-09-24 DIAGNOSIS — N2581 Secondary hyperparathyroidism of renal origin: Secondary | ICD-10-CM | POA: Diagnosis not present

## 2019-09-24 DIAGNOSIS — Z992 Dependence on renal dialysis: Secondary | ICD-10-CM | POA: Diagnosis not present

## 2019-09-26 DIAGNOSIS — N2581 Secondary hyperparathyroidism of renal origin: Secondary | ICD-10-CM | POA: Diagnosis not present

## 2019-09-26 DIAGNOSIS — Z992 Dependence on renal dialysis: Secondary | ICD-10-CM | POA: Diagnosis not present

## 2019-09-26 DIAGNOSIS — N186 End stage renal disease: Secondary | ICD-10-CM | POA: Diagnosis not present

## 2019-09-29 DIAGNOSIS — N186 End stage renal disease: Secondary | ICD-10-CM | POA: Diagnosis not present

## 2019-09-29 DIAGNOSIS — N2581 Secondary hyperparathyroidism of renal origin: Secondary | ICD-10-CM | POA: Diagnosis not present

## 2019-09-29 DIAGNOSIS — Z992 Dependence on renal dialysis: Secondary | ICD-10-CM | POA: Diagnosis not present

## 2019-09-29 NOTE — Progress Notes (Signed)
I reviewed note and agree with plan.   Penni Bombard, MD 04/01/3843, 36:46 AM Certified in Neurology, Neurophysiology and Neuroimaging  [facility] [address] [place], [place] [address] [phone]

## 2019-10-01 DIAGNOSIS — N186 End stage renal disease: Secondary | ICD-10-CM | POA: Diagnosis not present

## 2019-10-01 DIAGNOSIS — N2581 Secondary hyperparathyroidism of renal origin: Secondary | ICD-10-CM | POA: Diagnosis not present

## 2019-10-01 DIAGNOSIS — Z992 Dependence on renal dialysis: Secondary | ICD-10-CM | POA: Diagnosis not present

## 2019-10-03 DIAGNOSIS — Z992 Dependence on renal dialysis: Secondary | ICD-10-CM | POA: Diagnosis not present

## 2019-10-03 DIAGNOSIS — N186 End stage renal disease: Secondary | ICD-10-CM | POA: Diagnosis not present

## 2019-10-03 DIAGNOSIS — N2581 Secondary hyperparathyroidism of renal origin: Secondary | ICD-10-CM | POA: Diagnosis not present

## 2019-10-06 DIAGNOSIS — N2581 Secondary hyperparathyroidism of renal origin: Secondary | ICD-10-CM | POA: Diagnosis not present

## 2019-10-06 DIAGNOSIS — Z992 Dependence on renal dialysis: Secondary | ICD-10-CM | POA: Diagnosis not present

## 2019-10-06 DIAGNOSIS — N186 End stage renal disease: Secondary | ICD-10-CM | POA: Diagnosis not present

## 2019-10-08 DIAGNOSIS — N186 End stage renal disease: Secondary | ICD-10-CM | POA: Diagnosis not present

## 2019-10-08 DIAGNOSIS — Z992 Dependence on renal dialysis: Secondary | ICD-10-CM | POA: Diagnosis not present

## 2019-10-08 DIAGNOSIS — N2581 Secondary hyperparathyroidism of renal origin: Secondary | ICD-10-CM | POA: Diagnosis not present

## 2019-10-10 DIAGNOSIS — N2581 Secondary hyperparathyroidism of renal origin: Secondary | ICD-10-CM | POA: Diagnosis not present

## 2019-10-10 DIAGNOSIS — N186 End stage renal disease: Secondary | ICD-10-CM | POA: Diagnosis not present

## 2019-10-10 DIAGNOSIS — Z992 Dependence on renal dialysis: Secondary | ICD-10-CM | POA: Diagnosis not present

## 2019-10-13 DIAGNOSIS — N2581 Secondary hyperparathyroidism of renal origin: Secondary | ICD-10-CM | POA: Diagnosis not present

## 2019-10-13 DIAGNOSIS — N186 End stage renal disease: Secondary | ICD-10-CM | POA: Diagnosis not present

## 2019-10-13 DIAGNOSIS — Z992 Dependence on renal dialysis: Secondary | ICD-10-CM | POA: Diagnosis not present

## 2019-10-15 DIAGNOSIS — N186 End stage renal disease: Secondary | ICD-10-CM | POA: Diagnosis not present

## 2019-10-15 DIAGNOSIS — Z992 Dependence on renal dialysis: Secondary | ICD-10-CM | POA: Diagnosis not present

## 2019-10-15 DIAGNOSIS — N2581 Secondary hyperparathyroidism of renal origin: Secondary | ICD-10-CM | POA: Diagnosis not present

## 2019-10-17 DIAGNOSIS — N2581 Secondary hyperparathyroidism of renal origin: Secondary | ICD-10-CM | POA: Diagnosis not present

## 2019-10-17 DIAGNOSIS — Z992 Dependence on renal dialysis: Secondary | ICD-10-CM | POA: Diagnosis not present

## 2019-10-17 DIAGNOSIS — N186 End stage renal disease: Secondary | ICD-10-CM | POA: Diagnosis not present

## 2019-10-20 DIAGNOSIS — N186 End stage renal disease: Secondary | ICD-10-CM | POA: Diagnosis not present

## 2019-10-20 DIAGNOSIS — N2581 Secondary hyperparathyroidism of renal origin: Secondary | ICD-10-CM | POA: Diagnosis not present

## 2019-10-20 DIAGNOSIS — Z992 Dependence on renal dialysis: Secondary | ICD-10-CM | POA: Diagnosis not present

## 2019-10-22 DIAGNOSIS — N2581 Secondary hyperparathyroidism of renal origin: Secondary | ICD-10-CM | POA: Diagnosis not present

## 2019-10-22 DIAGNOSIS — Z992 Dependence on renal dialysis: Secondary | ICD-10-CM | POA: Diagnosis not present

## 2019-10-22 DIAGNOSIS — N186 End stage renal disease: Secondary | ICD-10-CM | POA: Diagnosis not present

## 2019-10-24 DIAGNOSIS — I120 Hypertensive chronic kidney disease with stage 5 chronic kidney disease or end stage renal disease: Secondary | ICD-10-CM | POA: Diagnosis not present

## 2019-10-24 DIAGNOSIS — N186 End stage renal disease: Secondary | ICD-10-CM | POA: Diagnosis not present

## 2019-10-24 DIAGNOSIS — N2581 Secondary hyperparathyroidism of renal origin: Secondary | ICD-10-CM | POA: Diagnosis not present

## 2019-10-24 DIAGNOSIS — Z992 Dependence on renal dialysis: Secondary | ICD-10-CM | POA: Diagnosis not present

## 2019-10-27 DIAGNOSIS — N186 End stage renal disease: Secondary | ICD-10-CM | POA: Diagnosis not present

## 2019-10-27 DIAGNOSIS — N2581 Secondary hyperparathyroidism of renal origin: Secondary | ICD-10-CM | POA: Diagnosis not present

## 2019-10-27 DIAGNOSIS — Z992 Dependence on renal dialysis: Secondary | ICD-10-CM | POA: Diagnosis not present

## 2019-10-29 DIAGNOSIS — N2581 Secondary hyperparathyroidism of renal origin: Secondary | ICD-10-CM | POA: Diagnosis not present

## 2019-10-29 DIAGNOSIS — N186 End stage renal disease: Secondary | ICD-10-CM | POA: Diagnosis not present

## 2019-10-29 DIAGNOSIS — Z992 Dependence on renal dialysis: Secondary | ICD-10-CM | POA: Diagnosis not present

## 2019-10-31 DIAGNOSIS — N2581 Secondary hyperparathyroidism of renal origin: Secondary | ICD-10-CM | POA: Diagnosis not present

## 2019-10-31 DIAGNOSIS — Z992 Dependence on renal dialysis: Secondary | ICD-10-CM | POA: Diagnosis not present

## 2019-10-31 DIAGNOSIS — N186 End stage renal disease: Secondary | ICD-10-CM | POA: Diagnosis not present

## 2019-11-03 DIAGNOSIS — Z992 Dependence on renal dialysis: Secondary | ICD-10-CM | POA: Diagnosis not present

## 2019-11-03 DIAGNOSIS — N2581 Secondary hyperparathyroidism of renal origin: Secondary | ICD-10-CM | POA: Diagnosis not present

## 2019-11-03 DIAGNOSIS — N186 End stage renal disease: Secondary | ICD-10-CM | POA: Diagnosis not present

## 2019-11-05 DIAGNOSIS — N2581 Secondary hyperparathyroidism of renal origin: Secondary | ICD-10-CM | POA: Diagnosis not present

## 2019-11-05 DIAGNOSIS — Z992 Dependence on renal dialysis: Secondary | ICD-10-CM | POA: Diagnosis not present

## 2019-11-05 DIAGNOSIS — N186 End stage renal disease: Secondary | ICD-10-CM | POA: Diagnosis not present

## 2019-11-07 DIAGNOSIS — N186 End stage renal disease: Secondary | ICD-10-CM | POA: Diagnosis not present

## 2019-11-07 DIAGNOSIS — Z992 Dependence on renal dialysis: Secondary | ICD-10-CM | POA: Diagnosis not present

## 2019-11-07 DIAGNOSIS — N2581 Secondary hyperparathyroidism of renal origin: Secondary | ICD-10-CM | POA: Diagnosis not present

## 2019-11-10 DIAGNOSIS — N186 End stage renal disease: Secondary | ICD-10-CM | POA: Diagnosis not present

## 2019-11-10 DIAGNOSIS — N2581 Secondary hyperparathyroidism of renal origin: Secondary | ICD-10-CM | POA: Diagnosis not present

## 2019-11-10 DIAGNOSIS — Z992 Dependence on renal dialysis: Secondary | ICD-10-CM | POA: Diagnosis not present

## 2019-11-12 DIAGNOSIS — Z992 Dependence on renal dialysis: Secondary | ICD-10-CM | POA: Diagnosis not present

## 2019-11-12 DIAGNOSIS — N2581 Secondary hyperparathyroidism of renal origin: Secondary | ICD-10-CM | POA: Diagnosis not present

## 2019-11-12 DIAGNOSIS — N186 End stage renal disease: Secondary | ICD-10-CM | POA: Diagnosis not present

## 2019-11-14 DIAGNOSIS — Z992 Dependence on renal dialysis: Secondary | ICD-10-CM | POA: Diagnosis not present

## 2019-11-14 DIAGNOSIS — N2581 Secondary hyperparathyroidism of renal origin: Secondary | ICD-10-CM | POA: Diagnosis not present

## 2019-11-14 DIAGNOSIS — N186 End stage renal disease: Secondary | ICD-10-CM | POA: Diagnosis not present

## 2019-11-17 DIAGNOSIS — N2581 Secondary hyperparathyroidism of renal origin: Secondary | ICD-10-CM | POA: Diagnosis not present

## 2019-11-17 DIAGNOSIS — Z992 Dependence on renal dialysis: Secondary | ICD-10-CM | POA: Diagnosis not present

## 2019-11-17 DIAGNOSIS — N186 End stage renal disease: Secondary | ICD-10-CM | POA: Diagnosis not present

## 2019-11-19 DIAGNOSIS — N2581 Secondary hyperparathyroidism of renal origin: Secondary | ICD-10-CM | POA: Diagnosis not present

## 2019-11-19 DIAGNOSIS — N186 End stage renal disease: Secondary | ICD-10-CM | POA: Diagnosis not present

## 2019-11-19 DIAGNOSIS — Z992 Dependence on renal dialysis: Secondary | ICD-10-CM | POA: Diagnosis not present

## 2019-11-21 DIAGNOSIS — N2581 Secondary hyperparathyroidism of renal origin: Secondary | ICD-10-CM | POA: Diagnosis not present

## 2019-11-21 DIAGNOSIS — N186 End stage renal disease: Secondary | ICD-10-CM | POA: Diagnosis not present

## 2019-11-21 DIAGNOSIS — Z992 Dependence on renal dialysis: Secondary | ICD-10-CM | POA: Diagnosis not present

## 2019-11-24 DIAGNOSIS — I120 Hypertensive chronic kidney disease with stage 5 chronic kidney disease or end stage renal disease: Secondary | ICD-10-CM | POA: Diagnosis not present

## 2019-11-24 DIAGNOSIS — N186 End stage renal disease: Secondary | ICD-10-CM | POA: Diagnosis not present

## 2019-11-24 DIAGNOSIS — Z992 Dependence on renal dialysis: Secondary | ICD-10-CM | POA: Diagnosis not present

## 2019-11-24 DIAGNOSIS — N2581 Secondary hyperparathyroidism of renal origin: Secondary | ICD-10-CM | POA: Diagnosis not present

## 2019-11-26 DIAGNOSIS — R002 Palpitations: Secondary | ICD-10-CM | POA: Diagnosis not present

## 2019-11-26 DIAGNOSIS — I10 Essential (primary) hypertension: Secondary | ICD-10-CM | POA: Diagnosis not present

## 2019-11-26 DIAGNOSIS — Z992 Dependence on renal dialysis: Secondary | ICD-10-CM | POA: Diagnosis not present

## 2019-11-26 DIAGNOSIS — N186 End stage renal disease: Secondary | ICD-10-CM | POA: Diagnosis not present

## 2019-11-26 DIAGNOSIS — I2510 Atherosclerotic heart disease of native coronary artery without angina pectoris: Secondary | ICD-10-CM | POA: Diagnosis not present

## 2019-11-26 DIAGNOSIS — N2581 Secondary hyperparathyroidism of renal origin: Secondary | ICD-10-CM | POA: Diagnosis not present

## 2019-11-28 DIAGNOSIS — N2581 Secondary hyperparathyroidism of renal origin: Secondary | ICD-10-CM | POA: Diagnosis not present

## 2019-11-28 DIAGNOSIS — Z992 Dependence on renal dialysis: Secondary | ICD-10-CM | POA: Diagnosis not present

## 2019-11-28 DIAGNOSIS — N186 End stage renal disease: Secondary | ICD-10-CM | POA: Diagnosis not present

## 2019-12-01 DIAGNOSIS — N2581 Secondary hyperparathyroidism of renal origin: Secondary | ICD-10-CM | POA: Diagnosis not present

## 2019-12-01 DIAGNOSIS — Z992 Dependence on renal dialysis: Secondary | ICD-10-CM | POA: Diagnosis not present

## 2019-12-01 DIAGNOSIS — N186 End stage renal disease: Secondary | ICD-10-CM | POA: Diagnosis not present

## 2019-12-03 ENCOUNTER — Inpatient Hospital Stay (HOSPITAL_COMMUNITY)
Admission: EM | Admit: 2019-12-03 | Discharge: 2019-12-11 | DRG: 286 | Disposition: A | Discharge status: Home / Self Care | Payer: Medicare HMO | Attending: Internal Medicine | Admitting: Internal Medicine

## 2019-12-03 ENCOUNTER — Emergency Department (HOSPITAL_COMMUNITY): Payer: Medicare HMO

## 2019-12-03 ENCOUNTER — Other Ambulatory Visit: Payer: Self-pay

## 2019-12-03 ENCOUNTER — Encounter (HOSPITAL_COMMUNITY): Payer: Self-pay | Admitting: Emergency Medicine

## 2019-12-03 DIAGNOSIS — M898X9 Other specified disorders of bone, unspecified site: Secondary | ICD-10-CM | POA: Diagnosis present

## 2019-12-03 DIAGNOSIS — R778 Other specified abnormalities of plasma proteins: Secondary | ICD-10-CM

## 2019-12-03 DIAGNOSIS — D631 Anemia in chronic kidney disease: Secondary | ICD-10-CM | POA: Diagnosis present

## 2019-12-03 DIAGNOSIS — I132 Hypertensive heart and chronic kidney disease with heart failure and with stage 5 chronic kidney disease, or end stage renal disease: Principal | ICD-10-CM | POA: Diagnosis present

## 2019-12-03 DIAGNOSIS — I480 Paroxysmal atrial fibrillation: Secondary | ICD-10-CM | POA: Diagnosis present

## 2019-12-03 DIAGNOSIS — E440 Moderate protein-calorie malnutrition: Secondary | ICD-10-CM | POA: Diagnosis present

## 2019-12-03 DIAGNOSIS — Q600 Renal agenesis, unilateral: Secondary | ICD-10-CM | POA: Diagnosis not present

## 2019-12-03 DIAGNOSIS — N250 Renal osteodystrophy: Secondary | ICD-10-CM | POA: Diagnosis not present

## 2019-12-03 DIAGNOSIS — I4891 Unspecified atrial fibrillation: Secondary | ICD-10-CM | POA: Diagnosis present

## 2019-12-03 DIAGNOSIS — I5023 Acute on chronic systolic (congestive) heart failure: Secondary | ICD-10-CM | POA: Diagnosis present

## 2019-12-03 DIAGNOSIS — K5900 Constipation, unspecified: Secondary | ICD-10-CM | POA: Diagnosis present

## 2019-12-03 DIAGNOSIS — I5021 Acute systolic (congestive) heart failure: Secondary | ICD-10-CM | POA: Diagnosis not present

## 2019-12-03 DIAGNOSIS — J168 Pneumonia due to other specified infectious organisms: Secondary | ICD-10-CM | POA: Diagnosis not present

## 2019-12-03 DIAGNOSIS — Z992 Dependence on renal dialysis: Secondary | ICD-10-CM

## 2019-12-03 DIAGNOSIS — Z20822 Contact with and (suspected) exposure to covid-19: Secondary | ICD-10-CM | POA: Diagnosis present

## 2019-12-03 DIAGNOSIS — R748 Abnormal levels of other serum enzymes: Secondary | ICD-10-CM | POA: Diagnosis not present

## 2019-12-03 DIAGNOSIS — J90 Pleural effusion, not elsewhere classified: Secondary | ICD-10-CM | POA: Diagnosis not present

## 2019-12-03 DIAGNOSIS — Z888 Allergy status to other drugs, medicaments and biological substances status: Secondary | ICD-10-CM

## 2019-12-03 DIAGNOSIS — J189 Pneumonia, unspecified organism: Secondary | ICD-10-CM | POA: Diagnosis present

## 2019-12-03 DIAGNOSIS — N186 End stage renal disease: Secondary | ICD-10-CM | POA: Diagnosis present

## 2019-12-03 DIAGNOSIS — I420 Dilated cardiomyopathy: Secondary | ICD-10-CM | POA: Diagnosis present

## 2019-12-03 DIAGNOSIS — E876 Hypokalemia: Secondary | ICD-10-CM | POA: Diagnosis present

## 2019-12-03 DIAGNOSIS — R0902 Hypoxemia: Secondary | ICD-10-CM | POA: Diagnosis not present

## 2019-12-03 DIAGNOSIS — I248 Other forms of acute ischemic heart disease: Secondary | ICD-10-CM | POA: Diagnosis present

## 2019-12-03 DIAGNOSIS — T8612 Kidney transplant failure: Secondary | ICD-10-CM | POA: Diagnosis present

## 2019-12-03 DIAGNOSIS — J810 Acute pulmonary edema: Secondary | ICD-10-CM | POA: Diagnosis not present

## 2019-12-03 DIAGNOSIS — D638 Anemia in other chronic diseases classified elsewhere: Secondary | ICD-10-CM | POA: Diagnosis not present

## 2019-12-03 DIAGNOSIS — M109 Gout, unspecified: Secondary | ICD-10-CM | POA: Diagnosis present

## 2019-12-03 DIAGNOSIS — N2581 Secondary hyperparathyroidism of renal origin: Secondary | ICD-10-CM | POA: Diagnosis present

## 2019-12-03 DIAGNOSIS — J9601 Acute respiratory failure with hypoxia: Secondary | ICD-10-CM | POA: Diagnosis present

## 2019-12-03 DIAGNOSIS — I471 Supraventricular tachycardia: Secondary | ICD-10-CM | POA: Diagnosis present

## 2019-12-03 DIAGNOSIS — E213 Hyperparathyroidism, unspecified: Secondary | ICD-10-CM | POA: Diagnosis present

## 2019-12-03 DIAGNOSIS — E875 Hyperkalemia: Secondary | ICD-10-CM | POA: Diagnosis not present

## 2019-12-03 DIAGNOSIS — I2510 Atherosclerotic heart disease of native coronary artery without angina pectoris: Secondary | ICD-10-CM | POA: Diagnosis present

## 2019-12-03 DIAGNOSIS — Y830 Surgical operation with transplant of whole organ as the cause of abnormal reaction of the patient, or of later complication, without mention of misadventure at the time of the procedure: Secondary | ICD-10-CM | POA: Diagnosis present

## 2019-12-03 DIAGNOSIS — I340 Nonrheumatic mitral (valve) insufficiency: Secondary | ICD-10-CM | POA: Diagnosis not present

## 2019-12-03 DIAGNOSIS — D539 Nutritional anemia, unspecified: Secondary | ICD-10-CM | POA: Diagnosis present

## 2019-12-03 DIAGNOSIS — R0602 Shortness of breath: Secondary | ICD-10-CM | POA: Diagnosis not present

## 2019-12-03 DIAGNOSIS — Z79899 Other long term (current) drug therapy: Secondary | ICD-10-CM

## 2019-12-03 DIAGNOSIS — R918 Other nonspecific abnormal finding of lung field: Secondary | ICD-10-CM

## 2019-12-03 DIAGNOSIS — I359 Nonrheumatic aortic valve disorder, unspecified: Secondary | ICD-10-CM | POA: Diagnosis not present

## 2019-12-03 DIAGNOSIS — I509 Heart failure, unspecified: Secondary | ICD-10-CM

## 2019-12-03 DIAGNOSIS — I081 Rheumatic disorders of both mitral and tricuspid valves: Secondary | ICD-10-CM | POA: Diagnosis present

## 2019-12-03 DIAGNOSIS — E785 Hyperlipidemia, unspecified: Secondary | ICD-10-CM | POA: Diagnosis present

## 2019-12-03 DIAGNOSIS — D531 Other megaloblastic anemias, not elsewhere classified: Secondary | ICD-10-CM | POA: Diagnosis not present

## 2019-12-03 DIAGNOSIS — Z8249 Family history of ischemic heart disease and other diseases of the circulatory system: Secondary | ICD-10-CM

## 2019-12-03 DIAGNOSIS — E8770 Fluid overload, unspecified: Secondary | ICD-10-CM | POA: Diagnosis not present

## 2019-12-03 DIAGNOSIS — I441 Atrioventricular block, second degree: Secondary | ICD-10-CM | POA: Diagnosis present

## 2019-12-03 DIAGNOSIS — G40909 Epilepsy, unspecified, not intractable, without status epilepticus: Secondary | ICD-10-CM | POA: Diagnosis present

## 2019-12-03 DIAGNOSIS — Z7982 Long term (current) use of aspirin: Secondary | ICD-10-CM

## 2019-12-03 DIAGNOSIS — I120 Hypertensive chronic kidney disease with stage 5 chronic kidney disease or end stage renal disease: Secondary | ICD-10-CM | POA: Diagnosis not present

## 2019-12-03 DIAGNOSIS — Z8679 Personal history of other diseases of the circulatory system: Secondary | ICD-10-CM | POA: Diagnosis not present

## 2019-12-03 HISTORY — DX: Family history of other specified conditions: Z84.89

## 2019-12-03 LAB — CBC
HCT: 33.9 % — ABNORMAL LOW (ref 39.0–52.0)
Hemoglobin: 11.3 g/dL — ABNORMAL LOW (ref 13.0–17.0)
MCH: 33.6 pg (ref 26.0–34.0)
MCHC: 33.3 g/dL (ref 30.0–36.0)
MCV: 100.9 fL — ABNORMAL HIGH (ref 80.0–100.0)
Platelets: 308 10*3/uL (ref 150–400)
RBC: 3.36 MIL/uL — ABNORMAL LOW (ref 4.22–5.81)
RDW: 13.7 % (ref 11.5–15.5)
WBC: 10.9 10*3/uL — ABNORMAL HIGH (ref 4.0–10.5)
nRBC: 0 % (ref 0.0–0.2)

## 2019-12-03 LAB — BASIC METABOLIC PANEL
Anion gap: 13 (ref 5–15)
BUN: 12 mg/dL (ref 6–20)
CO2: 31 mmol/L (ref 22–32)
Calcium: 8.7 mg/dL — ABNORMAL LOW (ref 8.9–10.3)
Chloride: 93 mmol/L — ABNORMAL LOW (ref 98–111)
Creatinine, Ser: 3.84 mg/dL — ABNORMAL HIGH (ref 0.61–1.24)
GFR calc Af Amer: 19 mL/min — ABNORMAL LOW (ref >60)
GFR calc non Af Amer: 16 mL/min — ABNORMAL LOW (ref >60)
Glucose, Bld: 117 mg/dL — ABNORMAL HIGH (ref 70–99)
Potassium: 3.2 mmol/L — ABNORMAL LOW (ref 3.5–5.1)
Sodium: 137 mmol/L (ref 135–145)

## 2019-12-03 LAB — TROPONIN I (HIGH SENSITIVITY)
Troponin I (High Sensitivity): 65 ng/L — ABNORMAL HIGH (ref <18)
Troponin I (High Sensitivity): 60 ng/L — ABNORMAL HIGH (ref <18)

## 2019-12-03 LAB — SARS CORONAVIRUS 2 BY RT PCR (HOSPITAL ORDER, PERFORMED IN ~~LOC~~ HOSPITAL LAB): SARS Coronavirus 2: NEGATIVE

## 2019-12-03 NOTE — ED Triage Notes (Addendum)
C/o SOB at rest and productive cough with clear phlegm x 1 week.  Completed dialysis today.  Denies pain. Received COVID vaccinations x 2.  85% on room air.  Placed on 2 liters O2 via [place].

## 2019-12-04 ENCOUNTER — Encounter (HOSPITAL_COMMUNITY): Payer: Self-pay | Admitting: Family Medicine

## 2019-12-04 ENCOUNTER — Inpatient Hospital Stay (HOSPITAL_COMMUNITY): Payer: Medicare HMO

## 2019-12-04 DIAGNOSIS — G40909 Epilepsy, unspecified, not intractable, without status epilepticus: Secondary | ICD-10-CM | POA: Diagnosis present

## 2019-12-04 DIAGNOSIS — E213 Hyperparathyroidism, unspecified: Secondary | ICD-10-CM | POA: Diagnosis present

## 2019-12-04 DIAGNOSIS — J9601 Acute respiratory failure with hypoxia: Secondary | ICD-10-CM | POA: Diagnosis present

## 2019-12-04 DIAGNOSIS — N2581 Secondary hyperparathyroidism of renal origin: Secondary | ICD-10-CM | POA: Diagnosis present

## 2019-12-04 DIAGNOSIS — Z992 Dependence on renal dialysis: Secondary | ICD-10-CM

## 2019-12-04 DIAGNOSIS — D631 Anemia in chronic kidney disease: Secondary | ICD-10-CM | POA: Diagnosis present

## 2019-12-04 DIAGNOSIS — N186 End stage renal disease: Secondary | ICD-10-CM | POA: Diagnosis present

## 2019-12-04 DIAGNOSIS — E876 Hypokalemia: Secondary | ICD-10-CM | POA: Diagnosis present

## 2019-12-04 DIAGNOSIS — Q600 Renal agenesis, unilateral: Secondary | ICD-10-CM | POA: Diagnosis not present

## 2019-12-04 DIAGNOSIS — I248 Other forms of acute ischemic heart disease: Secondary | ICD-10-CM | POA: Diagnosis present

## 2019-12-04 DIAGNOSIS — R778 Other specified abnormalities of plasma proteins: Secondary | ICD-10-CM | POA: Diagnosis not present

## 2019-12-04 DIAGNOSIS — E875 Hyperkalemia: Secondary | ICD-10-CM | POA: Diagnosis not present

## 2019-12-04 DIAGNOSIS — E785 Hyperlipidemia, unspecified: Secondary | ICD-10-CM | POA: Diagnosis present

## 2019-12-04 DIAGNOSIS — I132 Hypertensive heart and chronic kidney disease with heart failure and with stage 5 chronic kidney disease, or end stage renal disease: Secondary | ICD-10-CM | POA: Diagnosis present

## 2019-12-04 DIAGNOSIS — D539 Nutritional anemia, unspecified: Secondary | ICD-10-CM | POA: Diagnosis present

## 2019-12-04 DIAGNOSIS — I480 Paroxysmal atrial fibrillation: Secondary | ICD-10-CM | POA: Diagnosis present

## 2019-12-04 DIAGNOSIS — I420 Dilated cardiomyopathy: Secondary | ICD-10-CM | POA: Diagnosis present

## 2019-12-04 DIAGNOSIS — E440 Moderate protein-calorie malnutrition: Secondary | ICD-10-CM | POA: Diagnosis present

## 2019-12-04 DIAGNOSIS — I5023 Acute on chronic systolic (congestive) heart failure: Secondary | ICD-10-CM | POA: Diagnosis present

## 2019-12-04 DIAGNOSIS — Z20822 Contact with and (suspected) exposure to covid-19: Secondary | ICD-10-CM | POA: Diagnosis present

## 2019-12-04 DIAGNOSIS — J189 Pneumonia, unspecified organism: Secondary | ICD-10-CM | POA: Diagnosis present

## 2019-12-04 DIAGNOSIS — I471 Supraventricular tachycardia: Secondary | ICD-10-CM | POA: Diagnosis present

## 2019-12-04 DIAGNOSIS — I2510 Atherosclerotic heart disease of native coronary artery without angina pectoris: Secondary | ICD-10-CM | POA: Diagnosis present

## 2019-12-04 DIAGNOSIS — T8612 Kidney transplant failure: Secondary | ICD-10-CM | POA: Diagnosis present

## 2019-12-04 DIAGNOSIS — Y830 Surgical operation with transplant of whole organ as the cause of abnormal reaction of the patient, or of later complication, without mention of misadventure at the time of the procedure: Secondary | ICD-10-CM | POA: Diagnosis present

## 2019-12-04 DIAGNOSIS — K5900 Constipation, unspecified: Secondary | ICD-10-CM | POA: Diagnosis present

## 2019-12-04 DIAGNOSIS — M109 Gout, unspecified: Secondary | ICD-10-CM | POA: Diagnosis present

## 2019-12-04 LAB — CBC
HCT: 33.3 % — ABNORMAL LOW (ref 39.0–52.0)
Hemoglobin: 10.8 g/dL — ABNORMAL LOW (ref 13.0–17.0)
MCH: 33.1 pg (ref 26.0–34.0)
MCHC: 32.4 g/dL (ref 30.0–36.0)
MCV: 102.1 fL — ABNORMAL HIGH (ref 80.0–100.0)
Platelets: 280 10*3/uL (ref 150–400)
RBC: 3.26 MIL/uL — ABNORMAL LOW (ref 4.22–5.81)
RDW: 13.9 % (ref 11.5–15.5)
WBC: 10.5 10*3/uL (ref 4.0–10.5)
nRBC: 0 % (ref 0.0–0.2)

## 2019-12-04 LAB — BASIC METABOLIC PANEL
Anion gap: 15 (ref 5–15)
BUN: 23 mg/dL — ABNORMAL HIGH (ref 6–20)
CO2: 29 mmol/L (ref 22–32)
Calcium: 9 mg/dL (ref 8.9–10.3)
Chloride: 93 mmol/L — ABNORMAL LOW (ref 98–111)
Creatinine, Ser: 5.44 mg/dL — ABNORMAL HIGH (ref 0.61–1.24)
GFR calc Af Amer: 12 mL/min — ABNORMAL LOW (ref >60)
GFR calc non Af Amer: 11 mL/min — ABNORMAL LOW (ref >60)
Glucose, Bld: 113 mg/dL — ABNORMAL HIGH (ref 70–99)
Potassium: 4.4 mmol/L (ref 3.5–5.1)
Sodium: 137 mmol/L (ref 135–145)

## 2019-12-04 LAB — EXPECTORATED SPUTUM ASSESSMENT W GRAM STAIN, RFLX TO RESP C

## 2019-12-04 LAB — ECHOCARDIOGRAM COMPLETE
Area-P 1/2: 5.13 cm2
Height: 63 in
S' Lateral: 5.5 cm
Weight: 2432 oz

## 2019-12-04 LAB — PROCALCITONIN: Procalcitonin: 1.88 ng/mL

## 2019-12-04 LAB — STREP PNEUMONIAE URINARY ANTIGEN: Strep Pneumo Urinary Antigen: NEGATIVE

## 2019-12-04 LAB — BRAIN NATRIURETIC PEPTIDE: B Natriuretic Peptide: >4500 pg/mL — ABNORMAL HIGH (ref 0.0–100.0)

## 2019-12-04 LAB — MAGNESIUM: Magnesium: 2.1 mg/dL (ref 1.7–2.4)

## 2019-12-04 MED ORDER — CALCITRIOL 0.5 MCG PO CAPS
1.0000 ug | ORAL_CAPSULE | ORAL | Status: DC
  Administered 2019-12-05: 1 ug via ORAL
  Filled 2019-12-04: qty 2

## 2019-12-04 MED ORDER — POTASSIUM CHLORIDE CRYS ER 20 MEQ PO TBCR
40.0000 meq | EXTENDED_RELEASE_TABLET | Freq: Once | ORAL | Status: AC
  Administered 2019-12-04: 40 meq via ORAL
  Filled 2019-12-04: qty 2

## 2019-12-04 MED ORDER — SODIUM CHLORIDE 0.9 % IV SOLN
500.0000 mg | Freq: Every day | INTRAVENOUS | Status: DC
  Administered 2019-12-04 – 2019-12-05 (×3): 500 mg via INTRAVENOUS
  Filled 2019-12-04 (×5): qty 500

## 2019-12-04 MED ORDER — MELATONIN 3 MG PO TABS
3.0000 mg | ORAL_TABLET | Freq: Every evening | ORAL | Status: DC | PRN
  Administered 2019-12-04 – 2019-12-08 (×4): 3 mg via ORAL
  Filled 2019-12-04 (×4): qty 1

## 2019-12-04 MED ORDER — SODIUM CHLORIDE 0.9% FLUSH: 3.0000 mL | INTRAVENOUS | Status: DC | PRN

## 2019-12-04 MED ORDER — ALLOPURINOL 300 MG PO TABS
300.0000 mg | ORAL_TABLET | Freq: Every day | ORAL | Status: DC
  Administered 2019-12-04 – 2019-12-11 (×7): 300 mg via ORAL
  Filled 2019-12-04 (×5): qty 1
  Filled 2019-12-04: qty 3
  Filled 2019-12-04 (×2): qty 1

## 2019-12-04 MED ORDER — CINACALCET HCL 30 MG PO TABS
180.0000 mg | ORAL_TABLET | ORAL | Status: DC
  Administered 2019-12-05 – 2019-12-10 (×3): 180 mg via ORAL
  Filled 2019-12-04 (×3): qty 6

## 2019-12-04 MED ORDER — PERFLUTREN LIPID MICROSPHERE
1.0000 mL | INTRAVENOUS | Status: AC | PRN
  Administered 2019-12-04: 2 mL via INTRAVENOUS
  Filled 2019-12-04: qty 10

## 2019-12-04 MED ORDER — SODIUM CHLORIDE 0.9 % IV SOLN
250.0000 mL | INTRAVENOUS | Status: DC | PRN
  Administered 2019-12-07: 250 mL via INTRAVENOUS

## 2019-12-04 MED ORDER — AMIODARONE HCL 200 MG PO TABS
200.0000 mg | ORAL_TABLET | Freq: Every day | ORAL | Status: DC
  Administered 2019-12-04 – 2019-12-11 (×6): 200 mg via ORAL
  Filled 2019-12-04 (×6): qty 1

## 2019-12-04 MED ORDER — HEPARIN SODIUM (PORCINE) 5000 UNIT/ML IJ SOLN
5000.0000 [IU] | Freq: Three times a day (TID) | INTRAMUSCULAR | Status: DC
  Administered 2019-12-04 – 2019-12-06 (×5): 5000 [IU] via SUBCUTANEOUS
  Filled 2019-12-04 (×5): qty 1

## 2019-12-04 MED ORDER — LEVETIRACETAM 500 MG PO TABS
500.0000 mg | ORAL_TABLET | Freq: Two times a day (BID) | ORAL | Status: DC
  Administered 2019-12-04 – 2019-12-11 (×15): 500 mg via ORAL
  Filled 2019-12-04 (×15): qty 1

## 2019-12-04 MED ORDER — ONDANSETRON HCL 4 MG/2ML IJ SOLN: 4.0000 mg | Freq: Four times a day (QID) | INTRAMUSCULAR | Status: DC | PRN

## 2019-12-04 MED ORDER — METOPROLOL TARTRATE 25 MG PO TABS
25.0000 mg | ORAL_TABLET | Freq: Every evening | ORAL | Status: DC
  Administered 2019-12-04 – 2019-12-05 (×2): 25 mg via ORAL
  Filled 2019-12-04 (×2): qty 1

## 2019-12-04 MED ORDER — DIGOXIN 125 MCG PO TABS
0.2500 mg | ORAL_TABLET | Freq: Every day | ORAL | Status: AC
  Administered 2019-12-05: 0.25 mg via ORAL
  Filled 2019-12-04: qty 2

## 2019-12-04 MED ORDER — DIVALPROEX SODIUM ER 250 MG PO TB24
250.0000 mg | ORAL_TABLET | Freq: Every day | ORAL | Status: DC
  Administered 2019-12-04 – 2019-12-10 (×7): 250 mg via ORAL
  Filled 2019-12-04 (×8): qty 1

## 2019-12-04 MED ORDER — ACETAMINOPHEN 325 MG PO TABS
650.0000 mg | ORAL_TABLET | Freq: Four times a day (QID) | ORAL | Status: DC | PRN
  Administered 2019-12-07 – 2019-12-08 (×2): 650 mg via ORAL
  Filled 2019-12-04 (×2): qty 2

## 2019-12-04 MED ORDER — SODIUM CHLORIDE 0.9 % IV SOLN
2.0000 g | Freq: Every day | INTRAVENOUS | Status: DC
  Administered 2019-12-04 (×2): 2 g via INTRAVENOUS
  Filled 2019-12-04 (×2): qty 20

## 2019-12-04 MED ORDER — SODIUM CHLORIDE 0.9% FLUSH
3.0000 mL | Freq: Two times a day (BID) | INTRAVENOUS | Status: DC
  Administered 2019-12-04 – 2019-12-11 (×6): 3 mL via INTRAVENOUS

## 2019-12-04 MED ORDER — HYDRALAZINE HCL 25 MG PO TABS
25.0000 mg | ORAL_TABLET | Freq: Four times a day (QID) | ORAL | Status: DC
  Administered 2019-12-04 – 2019-12-05 (×3): 25 mg via ORAL
  Filled 2019-12-04 (×4): qty 1

## 2019-12-04 MED ORDER — LAMOTRIGINE 25 MG PO TABS
25.0000 mg | ORAL_TABLET | Freq: Two times a day (BID) | ORAL | Status: DC
  Administered 2019-12-04 – 2019-12-11 (×15): 25 mg via ORAL
  Filled 2019-12-04 (×17): qty 1

## 2019-12-04 MED ORDER — CHLORHEXIDINE GLUCONATE CLOTH 2 % EX PADS
6.0000 | MEDICATED_PAD | Freq: Every day | CUTANEOUS | Status: DC
  Administered 2019-12-06: 6 via TOPICAL

## 2019-12-04 MED ORDER — GUAIFENESIN ER 600 MG PO TB12
600.0000 mg | ORAL_TABLET | Freq: Two times a day (BID) | ORAL | Status: DC
  Administered 2019-12-04 – 2019-12-11 (×15): 600 mg via ORAL
  Filled 2019-12-04 (×15): qty 1

## 2019-12-04 MED ORDER — ACETAMINOPHEN 650 MG RE SUPP: 650.0000 mg | Freq: Four times a day (QID) | RECTAL | Status: DC | PRN

## 2019-12-04 MED ORDER — ONDANSETRON HCL 4 MG PO TABS: 4.0000 mg | ORAL_TABLET | Freq: Four times a day (QID) | ORAL | Status: DC | PRN

## 2019-12-04 MED ORDER — SODIUM CHLORIDE 0.9% FLUSH
3.0000 mL | Freq: Two times a day (BID) | INTRAVENOUS | Status: DC
  Administered 2019-12-05 – 2019-12-06 (×2): 3 mL via INTRAVENOUS

## 2019-12-04 MED ORDER — ASPIRIN EC 81 MG PO TBEC
81.0000 mg | DELAYED_RELEASE_TABLET | Freq: Every day | ORAL | Status: DC
  Administered 2019-12-04 – 2019-12-09 (×6): 81 mg via ORAL
  Filled 2019-12-04 (×6): qty 1

## 2019-12-04 NOTE — ED Notes (Signed)
Back from [facility]

## 2019-12-04 NOTE — Consult Note (Signed)
Referring Physician: Dr. Jerald Kief Regalado/Dr. Deterding  Peter Sloan is an 59 y.o. male.                       Chief Complaint: Shortness of breath  HPI: 59 years old white male has 1-2 weeks h/o shortness of breath. He has ESRD, cardiomyopathy, seizure disorder and paroxysmal atrial fibrillation. He has mild cough without fever or chills. Chest x-ray is positive for pulmoary edema v/s multifocal pneumonia. His O2 sat was 74 to 84 % in office hence he was diveterted to ER for evaluation. His EKG shows mild tachycardia with non-specific ST-T changes. CBC shows mild anemia with Hgb of 11.3 to 10.8 g/dL. Electrolytes are normal. Creatinine is 23 and creatinine of 5.44 mg. HS-Troponin I is 60 ng. BNP over 4,500 pg. He is breathing better with supplemental oxygen and is awaiting hemodialysis today.  Past Medical History:  Diagnosis Date  . Atrial fibrillation ([place])   . Chronic renal insufficiency    2/2 ischemic ATN, GFR 15-17 ml/min, baseline Cr: 4-4.5, never had a renal biopsy, s/p AV fistula placement in 2007, f/b nephrologist, Dr. Dorothe Pea, [place]  . Congenital solitary kidney   . Congestive heart failure ([place])   . End stage renal disease on dialysis [facility])    fistula left arm, dialysis on Tues, Thurs, Sat.  . Gout    h/o: many in Rt big toe and Rt Knee, never had aspirartion diagnosis  . High cholesterol   . Hyperparathyroidism ([place])   . 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 Nonischemic cardiomyopathy ([facility])    EF 20%(9/06) with most recent EF 50%(3/08), valves normal per 2D echo (3/08), normal doppler and color flow study(3/08) s/p endocmyocardial  biopsy: mild myocyte hypertrophy no myocyte damage or inflammation  . Physical debility   . 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 Seizure disorder ([place])    complex- partial, h/o sz disorder as a child  . Seizures ([place])    last sz 07/05/12 grand mal  . Severe protein-calorie malnutrition ([place])   . Thyroid disorder        Past Surgical History:  Procedure Laterality Date  . HERNIA REPAIR    . KIDNEY SURGERY    . KIDNEY TRANSPLANT  08/14/12  . NEPHRECTOMY TRANSPLANTED ORGAN  10/14/12  . TONSILLECTOMY      Family History  Problem Relation Age of Onset  . Osteoarthritis Mother   . Hypertension Father   . Muscular dystrophy Brother   . Heart disease Child        one 29 y/o son has MVP   Social History:  reports that he has never smoked. He has never used smokeless tobacco. He reports that he does not drink alcohol and does not use drugs.  Allergies:  Allergies  Allergen Reactions  . Diphenhydramine Hcl Palpitations    (Not in a hospital admission)   Results for orders placed or performed during the hospital encounter of 12/03/19 (from the past 48 hour(s))  SARS Coronavirus 2 by RT PCR (hospital order, performed in [facility] hospital lab) Nasopharyngeal Nasopharyngeal Swab     Status: None   Collection Time: 12/03/19  4:54 PM   Specimen: Nasopharyngeal Swab  Result Value Ref Range   SARS Coronavirus 2 NEGATIVE NEGATIVE    Comment: (NOTE) SARS-CoV-2 target nucleic acids are NOT DETECTED.  The SARS-CoV-2 RNA is generally detectable in upper and lower respiratory specimens during the acute phase of infection. The  lowest concentration of SARS-CoV-2 viral copies this assay can detect is 250 copies / mL. A negative result does not preclude SARS-CoV-2 infection and should not be used as the sole basis for treatment or other patient management decisions.  A negative result may occur with improper specimen collection / handling, submission of specimen other than nasopharyngeal swab, presence of viral mutation(s) within the areas targeted by this assay, and inadequate number of viral copies (<250 copies / mL). A negative result must be combined with clinical observations, patient history, and epidemiological information.  Fact Sheet for Patients:    StrictlyIdeas.no  Fact Sheet for Healthcare Providers: BankingDealers.co.za  This test is not yet approved or  cleared by the [place] FDA and has been authorized for detection and/or diagnosis of SARS-CoV-2 by FDA under an Emergency Use Authorization (EUA).  This EUA will remain in effect (meaning this test can be used) for the duration of the COVID-19 declaration under Section 564(b)(1) of the Act, 21 U.S.C. section 360bbb-3(b)(1), unless the authorization is terminated or revoked sooner.  Performed at [facility], [place] [address], [place], [place] [address]   Basic metabolic panel     Status: Abnormal   Collection Time: 12/03/19  4:56 PM  Result Value Ref Range   Sodium 137 135 - 145 mmol/L   Potassium 3.2 (L) 3.5 - 5.1 mmol/L   Chloride 93 (L) 98 - 111 mmol/L   CO2 31 22 - 32 mmol/L   Glucose, Bld 117 (H) 70 - 99 mg/dL    Comment: Glucose reference range applies only to samples taken after fasting for at least 8 hours.   BUN 12 6 - 20 mg/dL   Creatinine, Ser 3.84 (H) 0.61 - 1.24 mg/dL   Calcium 8.7 (L) 8.9 - 10.3 mg/dL   GFR calc non Af Amer 16 (L) >60 mL/min   GFR calc Af Amer 19 (L) >60 mL/min   Anion gap 13 5 - 15    Comment: Performed at [facility] [address], [place], [place] [address]  CBC     Status: Abnormal   Collection Time: 12/03/19  4:56 PM  Result Value Ref [place]   WBC 10.9 (H) 4.0 - 10.5 K/uL   RBC 3.36 (L) 4.22 - 5.81 MIL/uL   Hemoglobin 11.3 (L) 13.0 - 17.0 g/dL   HCT 33.9 (L) 39 - 52 %   MCV 100.9 (H) 80.0 - 100.0 fL   MCH 33.6 26.0 - 34.0 pg   MCHC 33.3 30.0 - 36.0 g/dL   RDW 13.7 11.5 - 15.5 %   Platelets 308 150 - 400 K/uL   nRBC 0.0 0.0 - 0.2 %    Comment: Performed at [facility], [place] [address] [place], [place] [address]  Troponin I (High Sensitivity)     Status: Abnormal   Collection Time: 12/03/19  4:56 PM  Result Value Ref Range   Troponin I (High Sensitivity)  65 (H) <18 ng/L    Comment: (NOTE) Elevated high sensitivity troponin I (hsTnI) values and significant  changes across serial measurements may suggest ACS but many other  chronic and acute conditions are known to elevate hsTnI results.  Refer to the "Links" section for chest pain algorithms and additional  guidance. Performed at [facility], [place] [address] [place], [place] [address]   Brain natriuretic peptide     Status: Abnormal   Collection Time: 12/03/19  4:56 PM  Result Value Ref [place]  B Natriuretic Peptide >4,500.0 (H) 0.0 - 100.0 pg/mL    Comment: Performed at [facility] [address] [place], [place] [address]  Troponin I (High Sensitivity)     Status: Abnormal   Collection Time: 12/03/19  8:28 PM  Result Value Ref Range   Troponin I (High Sensitivity) 60 (H) <18 ng/L    Comment: (NOTE) Elevated high sensitivity troponin I (hsTnI) values and significant  changes across serial measurements may suggest ACS but many other  chronic and acute conditions are known to elevate hsTnI results.  Refer to the "Links" section for chest pain algorithms and additional  guidance. Performed at [facility], [place] [address] [place], [place] [address]   Basic metabolic panel     Status: Abnormal   Collection Time: 12/04/19  6:35 AM  Result Value Ref [place]   Sodium 137 135 - 145 mmol/L   Potassium 4.4 3.5 - 5.1 mmol/L    Comment: NO VISIBLE HEMOLYSIS   Chloride 93 (L) 98 - 111 mmol/L   CO2 29 22 - 32 mmol/L   Glucose, Bld 113 (H) 70 - 99 mg/dL    Comment: Glucose reference range applies only to samples taken after fasting for at least 8 hours.   BUN 23 (H) 6 - 20 mg/dL   Creatinine, Ser 5.44 (H) 0.61 - 1.24 mg/dL   Calcium 9.0 8.9 - 10.3 mg/dL   GFR calc non Af Amer 11 (L) >60 mL/min   GFR calc Af Amer 12 (L) >60 mL/min   Anion gap 15 5 - 15    Comment: Performed at [facility] [address] [place], [place] [address]  Magnesium     Status: None    Collection Time: 12/04/19  6:35 AM  Result Value Ref [place]   Magnesium 2.1 1.7 - 2.4 mg/dL    Comment: Performed at [facility] [address], [place], [place] [address]  CBC     Status: Abnormal   Collection Time: 12/04/19  6:35 AM  Result Value Ref [place]   WBC 10.5 4.0 - 10.5 K/uL   RBC 3.26 (L) 4.22 - 5.81 MIL/uL   Hemoglobin 10.8 (L) 13.0 - 17.0 g/dL   HCT 33.3 (L) 39 - 52 %   MCV 102.1 (H) 80.0 - 100.0 fL   MCH 33.1 26.0 - 34.0 pg   MCHC 32.4 30.0 - 36.0 g/dL   RDW 13.9 11.5 - 15.5 %   Platelets 280 150 - 400 K/uL   nRBC 0.0 0.0 - 0.2 %    Comment: Performed at [facility], [place] [address] [place], [place] [address]  Procalcitonin - Baseline     Status: None   Collection Time: 12/04/19  6:35 AM  Result Value Ref Range   Procalcitonin 1.88 ng/mL    Comment:        Interpretation: PCT > 0.5 ng/mL and <= 2 ng/mL: Systemic infection (sepsis) is possible, but other conditions are known to elevate PCT as well. (NOTE)       Sepsis PCT Algorithm           Lower Respiratory Tract                                      Infection PCT Algorithm    ----------------------------     ----------------------------  PCT < 0.25 ng/mL                PCT < 0.10 ng/mL          Strongly encourage             Strongly discourage   discontinuation of antibiotics    initiation of antibiotics    ----------------------------     -----------------------------       PCT 0.25 - 0.50 ng/mL            PCT 0.10 - 0.25 ng/mL               OR       >80% decrease in PCT            Discourage initiation of                                            antibiotics      Encourage discontinuation           of antibiotics    ----------------------------     -----------------------------         PCT >= 0.50 ng/mL              PCT 0.26 - 0.50 ng/mL                AND       <80% decrease in PCT             Encourage initiation of                                             antibiotics        Encourage continuation           of antibiotics    ----------------------------     -----------------------------        PCT >= 0.50 ng/mL                  PCT > 0.50 ng/mL               AND         increase in PCT                  Strongly encourage                                      initiation of antibiotics    Strongly encourage escalation           of antibiotics                                     -----------------------------                                           PCT <= 0.25 ng/mL  OR                                        > 80% decrease in PCT                                      Discontinue / Do not initiate                                             antibiotics  Performed at [facility], [place] [address] [place], [place] [address]    DG Chest Portable 1 View  Result Date: 12/03/2019 CLINICAL DATA:  Short of breath EXAM: PORTABLE CHEST 1 VIEW COMPARISON:  01/12/2012 FINDINGS: Single frontal view of the chest demonstrates an unremarkable cardiac silhouette. There is widespread bilateral ground-glass airspace disease. Chronic blunting of the left costophrenic angle consistent with pleural thickening or scarring. No pneumothorax. IMPRESSION: 1. Widespread bilateral ground-glass airspace disease consistent with multifocal pneumonia or edema. Electronically Signed   By: Randa Ngo M.D.   On: 12/03/2019 17:13    Review Of Systems Constitutional: No fever, chills, weight loss or gain. Eyes: No vision change, wears glasses. No discharge or pain. Ears: No hearing loss, No tinnitus. Respiratory: No asthma, COPD, pneumonias. Positive shortness of breath. No hemoptysis. Cardiovascular: No chest pain, palpitation, leg edema. Gastrointestinal: No nausea, vomiting, diarrhea, constipation. No GI bleed. No hepatitis. Genitourinary: No dysuria, hematuria, kidney stone. No incontinance. ESRD with hemodialysis Neurological: No  headache, stroke, positive seizures.  Psychiatry: No psych facility admission for anxiety, depression, suicide. No detox. Skin: No rash. Musculoskeletal: No joint pain, fibromyalgia. No neck pain, back pain. Lymphadenopathy: No lymphadenopathy. Hematology: Positive anemia or easy bruising.   Blood pressure (!) 117/94, pulse 90, temperature 97.8 F (36.6 C), temperature source Oral, resp. rate (!) 32, height 5\' 3"  (1.6 m), weight 68.9 kg, SpO2 99 %. Body mass index is 26.93 kg/m. General appearance: alert, cooperative, appears stated age and moderate respiratory distress Head: Normocephalic, atraumatic. Eyes: Blue eyes, pink conjunctiva, corneas clear. PERRL, EOM's intact. Neck: No adenopathy, no carotid bruit, full JVD, supple, symmetrical, trachea midline and thyroid not enlarged. Resp: Diffuse rhonchi to auscultation bilaterally. Cardio: Regular rate and rhythm, S1, S2 normal, II/VI systolic murmur, no click, rub or gallop GI: Soft, non-tender; bowel sounds normal; no organomegaly. Extremities: Trace edema, cyanosis or clubbing. Skin: Warm and dry.  Neurologic: Alert and oriented X 3, normal strength. Normal coordination and gait.  Assessment/Plan Acute systolic left heart failure, HFrEF Possible multifocal pneumonia ESRD with hemodialysis Anemia of chronic disease Seizure disorder  Agree with IV antibiotic and hemodialysis. Continue home medications Echocardiogram for LV function  Time spent: Review of old records, Lab, x-rays, EKG, other cardiac tests, examination, discussion with patient over 70 minutes.  Birdie Riddle, MD  12/04/2019, 9:29 AM

## 2019-12-04 NOTE — Procedures (Signed)
   I was present at this dialysis session, have reviewed the session itself and made  appropriate changes Kelly Splinter MD [place] pager [phone]   12/04/2019, 1:35 PM

## 2019-12-04 NOTE — H&P (Signed)
History and Physical    Peter Sloan ZTI:[id number] DOB: 1960-08-22 DOA: 12/03/2019  PCP: Mauricia Area, MD   Patient coming from: Home   Chief Complaint: SOB, cough   HPI: Peter Sloan is a 59 y.o. male with medical history significant for ESRD on hemodialysis, cardiomyopathy with EF 30 to 35%, seizure disorder, and paroxysmal atrial fibrillation not on anticoagulation, now presenting to the emergency department for evaluation of shortness of breath and cough.  Patient reports shortness of breath for 1 week though his sister at the bedside thinks it has been closer to 2 weeks.  He reports a productive cough, has not felt any fevers or chills, and denies any chest pain or palpitations.  He denies any leg swelling or tenderness.  He had trouble sleeping on his back recently but while that seemed to resolve, he continues to be dyspneic in general and coughing.  Cough has been productive.  He denies hemoptysis.  ED Course: Upon arrival to the ED, patient is found to be afebrile, saturating 85% on room air, mildly tachypneic, and with stable blood pressure. EKG features sinus rhythm with PVCs and LAD. Chest x-ray is concerning for widespread bilateral groundglass airspace disease suggestive of multifocal pneumonia or edema. Chemistry panel is notable for potassium 3.2, normal bicarbonate, and normal BUN. CBC features a leukocytosis to 10,900 and a mild anemia. Troponin is elevated to 65 and then 60. Covid PCR is negative. Patient was given 40 mEq of potassium in the ED and started on supplemental oxygen.  Review of Systems:  All other systems reviewed and apart from HPI, are negative.  Past Medical History:  Diagnosis Date  . Atrial fibrillation ([place])   . Chronic renal insufficiency    2/2 ischemic ATN, GFR 15-17 ml/min, baseline Cr: 4-4.5, never had a renal biopsy, s/p AV fistula placement in 2007, f/b nephrologist, Dr. Dorothe Pea, [place]  . Congenital solitary kidney   .  Congestive heart failure ([place])   . End stage renal disease on dialysis [facility])    fistula left arm, dialysis on Tues, Thurs, Sat.  . Gout    h/o: many in Rt big toe and Rt Knee, never had aspirartion diagnosis  . High cholesterol   . Hyperparathyroidism ([place])   . 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 Nonischemic cardiomyopathy (HCC)    EF 20%(9/06) with most recent EF 50%(3/08), valves normal per 2D echo (3/08), normal doppler and color flow study(3/08) s/p endocmyocardial  biopsy: mild myocyte hypertrophy no myocyte damage or inflammation  . Physical debility   . 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 Seizure disorder ([place])    complex- partial, h/o sz disorder as a child  . Seizures ([place])    last sz 07/05/12 grand mal  . Severe protein-calorie malnutrition ([place])   . Thyroid disorder     Past Surgical History:  Procedure Laterality Date  . HERNIA REPAIR    . KIDNEY SURGERY    . KIDNEY TRANSPLANT  08/14/12  . NEPHRECTOMY TRANSPLANTED ORGAN  10/14/12  . TONSILLECTOMY      Social History:   reports that he has never smoked. He has never used smokeless tobacco. He reports that he does not drink alcohol and does not use drugs.  Allergies  Allergen Reactions  . Diphenhydramine Hcl Palpitations    Family History  Problem Relation Age of Onset  . Osteoarthritis Mother   . Hypertension Father   . Muscular dystrophy Brother   . Heart disease Child  one 14 y/o son has MVP     Prior to Admission medications   Medication Sig Start Date End Date Taking? Authorizing Provider  acetaminophen (TYLENOL) 500 MG tablet Take 500 mg by mouth every 6 (six) hours as needed for mild pain.    [provider]  allopurinol (ZYLOPRIM) 300 MG tablet Take 300 mg by mouth daily. 09/29/12   [provider]  amiodarone (PACERONE) 200 MG tablet Take 200 mg by mouth daily.    [provider]  Ascorbic Acid (VITAMIN C) 1000 MG tablet Take 1,000 mg by mouth daily.     [provider]  aspirin 81 MG tablet Take 81 mg by mouth every morning.     [provider]  AURYXIA 1 GM 210 MG(Fe) tablet Take 420 mg by mouth daily as needed for constipation. 06/20/19   [provider]  b complex-vitamin c-folic acid (NEPHRO-VITE) 0.8 MG TABS tablet Take 0.8 mg by mouth daily.     [provider]  divalproex (DEPAKOTE ER) 250 MG 24 hr tablet Take 1 tablet (250 mg total) by mouth daily. 09/23/19   Lomax, Amy, NP  lamoTRIgine (LAMICTAL) 25 MG tablet Take 1 tablet (25 mg total) by mouth 2 (two) times daily. 09/23/19   Lomax, Amy, NP  levETIRAcetam (KEPPRA) 500 MG tablet Take 1 tablet (500 mg total) by mouth 2 (two) times daily. 09/23/19   Lomax, Amy, NP  Omega-3 Fatty Acids (FISH OIL) 1000 MG CAPS Take by mouth.    [provider]  SENSIPAR 60 MG tablet Take 180 mg by mouth daily.  07/21/15   [provider]    Physical Exam: Vitals:   12/04/19 0020 12/04/19 0049 12/04/19 0057 12/04/19 0115  BP: 136/63   131/84  Pulse: 88   91  Resp: 20   (!) 21  Temp: 97.8 F (36.6 C)     TempSrc: Oral     SpO2: 98%  96% 96%  Weight:  68.9 kg    Height:  5\' 3"  (1.6 m)      Constitutional: NAD, calm  Eyes: PERTLA, lids and conjunctivae normal ENMT: Mucous membranes are moist. Posterior pharynx clear of any exudate or lesions.   Neck: normal, supple, no masses, no thyromegaly Respiratory: Tachypnea, no wheezing. No pallor or cyanosis.  Cardiovascular: S1 & S2 heard, regular rate and rhythm. No extremity edema.   Abdomen: No distension, no tenderness, soft. Bowel sounds active.  Musculoskeletal: no clubbing / cyanosis. No joint deformity upper and lower extremities.   Skin: no significant rashes, lesions, ulcers. Warm, dry, well-perfused. Neurologic: CN 2-12 grossly intact. Sensation intact. Moving all extremities.  Psychiatric: Alert and oriented to person, place, and situation. Calm and cooperative.    Labs and Imaging on  Admission: I have personally reviewed following labs and imaging studies  CBC: Recent Labs  Lab 12/03/19 1656  WBC 10.9*  HGB 11.3*  HCT 33.9*  MCV 100.9*  PLT 174   Basic Metabolic Panel: Recent Labs  Lab 12/03/19 1656  NA 137  K 3.2*  CL 93*  CO2 31  GLUCOSE 117*  BUN 12  CREATININE 3.84*  CALCIUM 8.7*   GFR: Estimated Creatinine Clearance: 18.5 mL/min (A) (by C-G formula based on SCr of 3.84 mg/dL (H)). Liver Function Tests: No results for input(s): AST, ALT, ALKPHOS, BILITOT, PROT, ALBUMIN in the last 168 hours. No results for input(s): LIPASE, AMYLASE in the last 168 hours. No results for input(s): AMMONIA in the last 168  hours. Coagulation Profile: No results for input(s): INR, PROTIME in the last 168 hours. Cardiac Enzymes: No results for input(s): CKTOTAL, CKMB, CKMBINDEX, TROPONINI in the last 168 hours. BNP (last 3 results) No results for input(s): PROBNP in the last 8760 hours. HbA1C: No results for input(s): HGBA1C in the last 72 hours. CBG: No results for input(s): GLUCAP in the last 168 hours. Lipid Profile: No results for input(s): CHOL, HDL, LDLCALC, TRIG, CHOLHDL, LDLDIRECT in the last 72 hours. Thyroid Function Tests: No results for input(s): TSH, T4TOTAL, FREET4, T3FREE, THYROIDAB in the last 72 hours. Anemia Panel: No results for input(s): VITAMINB12, FOLATE, FERRITIN, TIBC, IRON, RETICCTPCT in the last 72 hours. Urine analysis:    Component Value Date/Time   COLORURINE YELLOW 01/13/2012 0946   APPEARANCEUR CLEAR 01/13/2012 0946   LABSPEC 1.019 01/13/2012 0946   PHURINE 8.0 01/13/2012 0946   GLUCOSEU NEGATIVE 01/13/2012 0946   HGBUR NEGATIVE 01/13/2012 0946   BILIRUBINUR NEGATIVE 01/13/2012 0946   KETONESUR NEGATIVE 01/13/2012 0946   PROTEINUR 100 (A) 01/13/2012 0946   UROBILINOGEN 0.2 01/13/2012 0946   NITRITE NEGATIVE 01/13/2012 0946   LEUKOCYTESUR NEGATIVE 01/13/2012 0946   Sepsis  Labs: @LABRCNTIP (procalcitonin:4,lacticidven:4) ) Recent Results (from the past 240 hour(s))  SARS Coronavirus 2 by RT PCR (hospital order, performed in [facility] hospital lab) Nasopharyngeal Nasopharyngeal Swab     Status: None   Collection Time: 12/03/19  4:54 PM   Specimen: Nasopharyngeal Swab  Result Value Ref Range Status   SARS Coronavirus 2 NEGATIVE NEGATIVE Final    Comment: (NOTE) SARS-CoV-2 target nucleic acids are NOT DETECTED.  The SARS-CoV-2 RNA is generally detectable in upper and lower respiratory specimens during the acute phase of infection. The lowest concentration of SARS-CoV-2 viral copies this assay can detect is 250 copies / mL. A negative result does not preclude SARS-CoV-2 infection and should not be used as the sole basis for treatment or other patient management decisions.  A negative result may occur with improper specimen collection / handling, submission of specimen other than nasopharyngeal swab, presence of viral mutation(s) within the areas targeted by this assay, and inadequate number of viral copies (<250 copies / mL). A negative result must be combined with clinical observations, patient history, and epidemiological information.  Fact Sheet for Patients:   StrictlyIdeas.no  Fact Sheet for Healthcare Providers: BankingDealers.co.za  This test is not yet approved or  cleared by the [place] FDA and has been authorized for detection and/or diagnosis of SARS-CoV-2 by FDA under an Emergency Use Authorization (EUA).  This EUA will remain in effect (meaning this test can be used) for the duration of the COVID-19 declaration under Section 564(b)(1) of the Act, 21 U.S.C. section 360bbb-3(b)(1), unless the authorization is terminated or revoked sooner.  Performed at [facility], [place] [address] [place], [place] [address]      Radiological Exams on Admission: DG Chest Portable 1  View  Result Date: 12/03/2019 CLINICAL DATA:  Short of breath EXAM: PORTABLE CHEST 1 VIEW COMPARISON:  01/12/2012 FINDINGS: Single frontal view of the chest demonstrates an unremarkable cardiac silhouette. There is widespread bilateral ground-glass airspace disease. Chronic blunting of the left costophrenic angle consistent with pleural thickening or scarring. No pneumothorax. IMPRESSION: 1. Widespread bilateral ground-glass airspace disease consistent with multifocal pneumonia or edema. Electronically Signed   By: Randa Ngo M.D.   On: 12/03/2019 17:13    EKG: Independently reviewed. Sinus tachycardia (rate 101), PVCs, LAD.   Assessment/Plan   1. Acute  hypoxic respiratory failure  - Presents with a week of SOB and productive cough and is found to have O2 saturation 85% on rm air, mild leukocytosis, and CXR findings concerning for edema vs multifocal PNA  - Check BNP and procalcitonin, culture sputum, check strep pneumo and legionella antigens, start Rocephin and azithromycin, continue supplemental O2 as needed    2. ESRD  - Patient reports completing HD on 9/9  - SLIV, renally-dose medications    3. Seizure disorder  - Continue Depakote, Keppra, Lamictal    4. Paroxysmal atrial fibrillation  - In sinus rhythm on admission  - CHADS-VASc 2 (CHF, HTN) and he is not anticoagulated  - Continue amiodarone   5. Hypokalemia  - Replaced in ED, repeat chem panel in am     6. Chronic systolic CHF  - EF 88-32% on remote echo  - Appears compensated, SLIV, monitor wt and I/Os   7. Elevated troponin  - HS troponin 65 then 60 in ED without chest pain   - Continue daily ASA    DVT prophylaxis: sq heparin  Code Status: Full  Family Communication: Sister updated at bedside   Disposition Plan:  Patient is from: Home  Anticipated d/c is to: TBD Anticipated d/c date is: 12/07/19 Patient currently: Has new supplemental O2 requirement pending further investigation and treatment  Consults  called: None  Admission status: Inpatient     Vianne Bulls, MD [facility]  12/04/2019, 1:50 AM

## 2019-12-04 NOTE — ED Notes (Signed)
To Dialysis 

## 2019-12-04 NOTE — Consult Note (Signed)
[facility] Renal Consultation Note    Indication for Consultation:  Management of ESRD/hemodialysis; anemia, hypertension/volume and secondary hyperparathyroidism PCP:  HPI: Peter Sloan is a 59 y.o. male with ESRD on hemodialysis at [facility] T[facility].  PMH: H/O congenital hypotrophic kidneys, S/P failed renal transplant, AFib (not on anticoagulation) NICM, seizure disorder, gout, AOCD, SHPT. Last HD 12/03/2019 left 0.3 kg under EDW. Has been leaving 0.4-0.3 kg under EDW past 3 treatments. He is compliant with HD, does not miss or truncate treatments.   Patient presented to ED post HD 12/03/2019 with C/O SOB, cough for over 1 week. He was afebrile on admission, BP controlled 128/86. O2 sats 85% on RA.  Tested negative for COVID 19. Labs drawn immediately post HD basically unremarkable for ESRD patient. WBC 10.9 HGB 11.6 PLT 308. CXR with Widespread bilateral ground-glass airspace disease consistent with multifocal pneumonia or edema. Potassium was supplemented, he was started on O2 and has been admitted per primary for acute hypoxic respiratory failure, Pulmonary edema vs multifocal PNA.   Seen in ED with sister at bedside. He doesn't look or feel well. Tachycardia, mild tachypnea, 1+ BLE edema noted with rales to scapula. Will order urgent HD for volume overload/pulmonary edema. Will have serial HD for volume removal.   Past Medical History:  Diagnosis Date  . Atrial fibrillation ([place])   . Chronic renal insufficiency    2/2 ischemic ATN, GFR 15-17 ml/min, baseline Cr: 4-4.5, never had a renal biopsy, s/p AV fistula placement in 2007, f/b nephrologist, Dr. Dorothe Pea, [place]  . Congenital solitary kidney   . Congestive heart failure ([place])   . End stage renal disease on dialysis [facility])    fistula left arm, dialysis on Tues, Thurs, Sat.  . Gout    h/o: many in Rt big toe and Rt Knee, never had aspirartion diagnosis  . High cholesterol   .  Hyperparathyroidism ([place])   . 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 Nonischemic cardiomyopathy (HCC)    EF 20%(9/06) with most recent EF 50%(3/08), valves normal per 2D echo (3/08), normal doppler and color flow study(3/08) s/p endocmyocardial  biopsy: mild myocyte hypertrophy no myocyte damage or inflammation  . Physical debility   . 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 Seizure disorder ([place])    complex- partial, h/o sz disorder as a child  . Seizures ([place])    last sz 07/05/12 grand mal  . Severe protein-calorie malnutrition ([place])   . Thyroid disorder    Past Surgical History:  Procedure Laterality Date  . HERNIA REPAIR    . KIDNEY SURGERY    . KIDNEY TRANSPLANT  08/14/12  . NEPHRECTOMY TRANSPLANTED ORGAN  10/14/12  . TONSILLECTOMY     Family History  Problem Relation Age of Onset  . Osteoarthritis Mother   . Hypertension Father   . Muscular dystrophy Brother   . Heart disease Child        one 27 y/o son has MVP   Social History:  reports that he has never smoked. He has never used smokeless tobacco. He reports that he does not drink alcohol and does not use drugs. Allergies  Allergen Reactions  . Diphenhydramine Hcl Palpitations   Prior to Admission medications   Medication Sig Start Date End Date Taking? Authorizing Provider  allopurinol (ZYLOPRIM) 300 MG tablet Take 300 mg by mouth daily. 09/29/12  Yes [provider]  amiodarone (PACERONE) 200 MG tablet Take 200 mg by mouth daily.  Yes [provider]  Ascorbic Acid (VITAMIN C) 1000 MG tablet Take 1,000 mg by mouth daily.   Yes [provider]  aspirin 81 MG tablet Take 81 mg by mouth every morning.    Yes [provider]  AURYXIA 1 GM 210 MG(Fe) tablet Take 420 mg by mouth in the morning and at bedtime.  06/20/19  Yes [provider]  b complex-vitamin c-folic acid (NEPHRO-VITE) 0.8 MG TABS tablet Take 0.8 mg by mouth daily.    Yes [provider]  divalproex  (DEPAKOTE ER) 250 MG 24 hr tablet Take 1 tablet (250 mg total) by mouth daily. Patient taking differently: Take 250 mg by mouth at bedtime.  09/23/19  Yes Lomax, Amy, NP  lamoTRIgine (LAMICTAL) 25 MG tablet Take 1 tablet (25 mg total) by mouth 2 (two) times daily. 09/23/19  Yes Lomax, Amy, NP  levETIRAcetam (KEPPRA) 500 MG tablet Take 1 tablet (500 mg total) by mouth 2 (two) times daily. 09/23/19  Yes Lomax, Amy, NP  metoprolol tartrate (LOPRESSOR) 25 MG tablet Take 25 mg by mouth every evening.   Yes [provider]  SENSIPAR 60 MG tablet Take 180 mg by mouth See admin instructions. Take one tablet by mouth on Tuesdays, Thursdays, Saturdays 07/21/15  Yes [provider]  VITAMIN D PO Take 1 tablet by mouth See admin instructions. Take one tablet by mouth on Tuesdays, Thursdays, Saturdays   Yes [provider]   Current Facility-Administered Medications  Medication Dose Route Frequency Provider Last Rate Last Admin  . 0.9 %  sodium chloride infusion  250 mL Intravenous PRN Opyd, Ilene Qua, MD      . acetaminophen (TYLENOL) tablet 650 mg  650 mg Oral Q6H PRN Opyd, Ilene Qua, MD       Or  . acetaminophen (TYLENOL) suppository 650 mg  650 mg Rectal Q6H PRN Opyd, Ilene Qua, MD      . allopurinol (ZYLOPRIM) tablet 300 mg  300 mg Oral Daily Opyd, Ilene Qua, MD   300 mg at 12/04/19 1122  . amiodarone (PACERONE) tablet 200 mg  200 mg Oral Daily Opyd, Ilene Qua, MD   200 mg at 12/04/19 1127  . aspirin EC tablet 81 mg  81 mg Oral Daily Opyd, Ilene Qua, MD   81 mg at 12/04/19 1124  . azithromycin (ZITHROMAX) 500 mg in sodium chloride 0.9 % 250 mL IVPB  500 mg Intravenous QHS Vianne Bulls, MD   Stopped at 12/04/19 0357  . cefTRIAXone (ROCEPHIN) 2 g in sodium chloride 0.9 % 100 mL IVPB  2 g Intravenous QHS Opyd, Ilene Qua, MD   Stopped at 12/04/19 0344  . Chlorhexidine Gluconate Cloth 2 % PADS 6 each  6 each Topical Q0600 Valentina Gu, NP      . Derrill Memo ON 12/05/2019]  cinacalcet (SENSIPAR) tablet 180 mg  180 mg Oral Q T,Th,Sa-HD Opyd, Ilene Qua, MD      . divalproex (DEPAKOTE ER) 24 hr tablet 250 mg  250 mg Oral QHS Opyd, Ilene Qua, MD      . guaiFENesin (MUCINEX) 12 hr tablet 600 mg  600 mg Oral BID Regalado, Belkys A, MD   600 mg at 12/04/19 1125  . heparin injection 5,000 Units  5,000 Units Subcutaneous Q8H Opyd, Ilene Qua, MD      . lamoTRIgine (LAMICTAL) tablet 25 mg  25 mg Oral BID Opyd, Ilene Qua, MD   25 mg at 12/04/19 1124  . levETIRAcetam (KEPPRA)  tablet 500 mg  500 mg Oral BID Opyd, Ilene Qua, MD   500 mg at 12/04/19 1124  . metoprolol tartrate (LOPRESSOR) tablet 25 mg  25 mg Oral QPM Opyd, Ilene Qua, MD      . ondansetron (ZOFRAN) tablet 4 mg  4 mg Oral Q6H PRN Opyd, Ilene Qua, MD       Or  . ondansetron (ZOFRAN) injection 4 mg  4 mg Intravenous Q6H PRN Opyd, Ilene Qua, MD      . perflutren lipid microspheres (DEFINITY) IV suspension  1-10 mL Intravenous PRN Dixie Dials, MD   2 mL at 12/04/19 1111  . sodium chloride flush (NS) 0.9 % injection 3 mL  3 mL Intravenous Q12H Opyd, Timothy S, MD      . sodium chloride flush (NS) 0.9 % injection 3 mL  3 mL Intravenous Q12H Opyd, Ilene Qua, MD   3 mL at 12/04/19 1128  . sodium chloride flush (NS) 0.9 % injection 3 mL  3 mL Intravenous PRN Opyd, Ilene Qua, MD       Current Outpatient Medications  Medication Sig Dispense Refill  . allopurinol (ZYLOPRIM) 300 MG tablet Take 300 mg by mouth daily.    Marland Kitchen amiodarone (PACERONE) 200 MG tablet Take 200 mg by mouth daily.    . Ascorbic Acid (VITAMIN C) 1000 MG tablet Take 1,000 mg by mouth daily.    Marland Kitchen aspirin 81 MG tablet Take 81 mg by mouth every morning.     Lorin Picket 1 GM 210 MG(Fe) tablet Take 420 mg by mouth in the morning and at bedtime.     Marland Kitchen b complex-vitamin c-folic acid (NEPHRO-VITE) 0.8 MG TABS tablet Take 0.8 mg by mouth daily.     . divalproex (DEPAKOTE ER) 250 MG 24 hr tablet Take 1 tablet (250 mg total) by mouth daily. (Patient taking differently:  Take 250 mg by mouth at bedtime. ) 90 tablet 4  . lamoTRIgine (LAMICTAL) 25 MG tablet Take 1 tablet (25 mg total) by mouth 2 (two) times daily. 180 tablet 4  . levETIRAcetam (KEPPRA) 500 MG tablet Take 1 tablet (500 mg total) by mouth 2 (two) times daily. 180 tablet 4  . metoprolol tartrate (LOPRESSOR) 25 MG tablet Take 25 mg by mouth every evening.    . SENSIPAR 60 MG tablet Take 180 mg by mouth See admin instructions. Take one tablet by mouth on Tuesdays, Thursdays, Saturdays  3  . VITAMIN D PO Take 1 tablet by mouth See admin instructions. Take one tablet by mouth on Tuesdays, Thursdays, Saturdays     Labs: Basic Metabolic Panel: Recent Labs  Lab 12/03/19 1656 12/04/19 0635  NA 137 137  K 3.2* 4.4  CL 93* 93*  CO2 31 29  GLUCOSE 117* 113*  BUN 12 23*  CREATININE 3.84* 5.44*  CALCIUM 8.7* 9.0   Liver Function Tests: No results for input(s): AST, ALT, ALKPHOS, BILITOT, PROT, ALBUMIN in the last 168 hours. No results for input(s): LIPASE, AMYLASE in the last 168 hours. No results for input(s): AMMONIA in the last 168 hours. CBC: Recent Labs  Lab 12/03/19 1656 12/04/19 0635  WBC 10.9* 10.5  HGB 11.3* 10.8*  HCT 33.9* 33.3*  MCV 100.9* 102.1*  PLT 308 280   Cardiac Enzymes: No results for input(s): CKTOTAL, CKMB, CKMBINDEX, TROPONINI in the last 168 hours. CBG: No results for input(s): GLUCAP in the last 168 hours. Iron Studies: No results for input(s): IRON, TIBC, TRANSFERRIN, FERRITIN in the last 72  hours. Studies/Results: DG Chest Portable 1 View  Result Date: 12/03/2019 CLINICAL DATA:  Short of breath EXAM: PORTABLE CHEST 1 VIEW COMPARISON:  01/12/2012 FINDINGS: Single frontal view of the chest demonstrates an unremarkable cardiac silhouette. There is widespread bilateral ground-glass airspace disease. Chronic blunting of the left costophrenic angle consistent with pleural thickening or scarring. No pneumothorax. IMPRESSION: 1. Widespread bilateral ground-glass airspace  disease consistent with multifocal pneumonia or edema. Electronically Signed   By: Randa Ngo M.D.   On: 12/03/2019 17:13    ROS: As per HPI otherwise negative.   Physical Exam: Vitals:   12/04/19 0600 12/04/19 1015 12/04/19 1130 12/04/19 1200  BP: (!) 117/94 (!) 118/91 (!) 126/91 (!) 145/102  Pulse: 90 95 99 (!) 107  Resp: (!) 32 (!) 25 (!) 32 (!) 36  Temp:      TempSrc:      SpO2: 99% 99% 93% 97%  Weight:      Height:         General: Chronically ill appearing male in no acute distress. Head: Normocephalic, atraumatic, sclera non-icteric, mucus membranes are moist Neck: Supple. JVD present 1/2 to mandible. Lungs: Bilateral breath sounds with crackles to scapula. Mild WOB present.  Heart: RRR with S1 S2 2/6 systolic M. ST on monitor rare PVCs.  Abdomen: Soft, non-tender, non-distended with normoactive bowel sounds. No rebound/guarding. No obvious abdominal masses. M-S:  Strength and tone appear normal for age. Lower extremities:1+Bilateral LE edema Neuro: Alert and oriented X 3. Moves all extremities spontaneously. Psych:  Responds to questions appropriately with a normal affect. Dialysis Access: LFA AVF + bruit  Dialysis Orders: [facility] T,Th,S 4 hours 180 NRe 400/800 69.5 kg 2.0 K/ 2.0 Ca AVG -Heparin 6800 units IV TIW -Sensipar 180 mg PO TIW -Calcitriol 1.0 mcg PO TIW (last PTH 744 11/05/2019)  Assessment/Plan: 1.  Pulmonary edema/Volume overload: Has been leaving under EDW at OP center, H/O HFrEF. Cardiology has been consulted. Urgent HD off schedule and again tomorrow for volume reduction. Lower EDW upon discharge.  2.  Possible MF PNA-ABX per primary.  3.  ESRD -  T,Th,S via AVF. HD today and again 12/05/2019 to resume T,Th,S schedule.  4.  Hypertension/volume  - As noted above. No antihypertensive meds on OP med list. HTN is volume controlled. UF as tolerated.  5.  Anemia  - HGB 11.3 No ESA needed. 6.  Metabolic bone disease -Chronic issues with hyperphosphatemia as  OP. Continue binders, VDRA, Sensipar (Ca 9.0). Order RFP with HD tomorrow to check PO4.  7.  Nutrition - Renal diet with fld restrictions.  8.   H/O Afib. Not on anticoagulation. On amiodarone, metoprolol.  9.   H/O HFrEF. Repeat ECHO today. Results pending. Dr. Doylene Canard consulted.   Trayonna Bachmeier H. Owens Shark, NP-C 12/04/2019, 12:43 PM  [employer] [phone]

## 2019-12-04 NOTE — Progress Notes (Signed)
PROGRESS NOTE    Peter Sloan  HEN:[id number] DOB: [birth year] DOA: 12/03/2019 PCP: Mauricia Area, MD   Brief Narrative: 59 year old with past medical history significant for ESRD on hemodialysis, cardiomyopathy with ejection fraction 30 to 35%, seizure disorder, paroxysmal atrial fibrillation not on anticoagulation, presents complaining of worsening shortness of breath and cough for the last 1 or 2 weeks.  He report productive cough.  Evaluation in the ED patient was found to be afebrile, hypoxic 85 on room air, tachypneic, chest x-ray with widespread bilateral groundglass airspace disease suggestive of multifocal pneumonia or edema.  Elevated BNP, mild elevation of troponin.  Covid PCR negative.    Assessment & Plan:   Principal Problem:   Acute respiratory failure with hypoxia (HCC) Active Problems:   Hypokalemia   Seizure disorder (HCC)   End stage renal disease on dialysis [facility])   Atrial fibrillation (HCC)  1-Acute Hypoxic Respiratory Failure secondary to acute systolic heart failure exacerbation and or pneumonia: -Patient presented with hypoxemia, dyspnea, elevated BNP 4000, chest x-ray asymmetric edema versus pneumonia. Prior EF 35 % -Nephrology has been contacted for hemodialysis. -Patient currently on 7 L of oxygen. -I will continue with IV antibiotics. Ceftriaxone and azithromycin.  -Sputum culture, flutter valve -Elevated pro-calcitonin.  -Start Guaifenesin.   2-Acute systolic heart failure exacerbation, paroxysmal A. fib: Cardiology consulted Echo pending  3-Hypokalemia: Replace.  4-ESRD: He had hemodialysis on 9/9 -Chest x-ray with pulmonary edema versus pneumonia.  BNP elevated.  Nephrology is consulted for hemodialysis  5-Paroxysmal A. Fib: Continue with amiodarone Cardiology consulted Defer to cardiology anticoagulation  6-Mild elevation of troponin: Related to demand ischemia in the setting of heart failure exacerbation. Cardiology  following. 7-seizure disorder: Continue with Keppra and Depakote  Estimated body mass index is 26.93 kg/m as calculated from the following:   Height as of this encounter: 5\' 3"  (1.6 m).   Weight as of this encounter: 68.9 kg.   DVT prophylaxis: Heparin Code Status: Full code Family Communication: Care discussed with patient and sister who was at bedside Disposition Plan:  Status is: Inpatient  Remains inpatient appropriate because:IV treatments appropriate due to intensity of illness or inability to take PO   Dispo: The patient is from: Home              Anticipated d/c is to: Home              Anticipated d/c date is: 3 days              Patient currently is not medically stable to d/c.        Consultants:   Nephrology  Cardiology  Procedures:   HD  ECHO;  Antimicrobials:  Ceftriaxone 9/10 Azithromycin 9/10.  Subjective: Patient reports shortness of breath for the last 1 or 2 weeks, productive cough.  Thick secretion  Objective: Vitals:   12/04/19 0445 12/04/19 0500 12/04/19 0545 12/04/19 0600  BP: 103/72 106/81 114/84 (!) 117/94  Pulse: 91 89 88 90  Resp: (!) 33 (!) 31 (!) 27 (!) 32  Temp:      TempSrc:      SpO2: 95% 95% 97% 99%  Weight:      Height:       No intake or output data in the 24 hours ending 12/04/19 0753 Filed Weights   12/04/19 0049  Weight: 68.9 kg    Examination:  General exam: Appears calm and comfortable  Respiratory system: Bilateral rhonchorous. Respiratory effort normal. Cardiovascular system: S1 & S2 heard,  RRR. positive JVD, murmurs, rubs, gallops or clicks. No pedal edema. Gastrointestinal system: Abdomen is nondistended, soft and nontender. No organomegaly or masses felt. Normal bowel sounds heard. Central nervous system: Alert and oriented.  Extremities: Symmetric 5 x 5 power. Skin: No rashes, lesions or ulcers   Data Reviewed: I have personally reviewed following labs and imaging studies  CBC: Recent Labs   Lab 12/03/19 1656 12/04/19 0635  WBC 10.9* 10.5  HGB 11.3* 10.8*  HCT 33.9* 33.3*  MCV 100.9* 102.1*  PLT 308 176   Basic Metabolic Panel: Recent Labs  Lab 12/03/19 1656 12/04/19 0635  NA 137 137  K 3.2* 4.4  CL 93* 93*  CO2 31 29  GLUCOSE 117* 113*  BUN 12 23*  CREATININE 3.84* 5.44*  CALCIUM 8.7* 9.0  MG  --  2.1   GFR: Estimated Creatinine Clearance: 13.1 mL/min (A) (by C-G formula based on SCr of 5.44 mg/dL (H)). Liver Function Tests: No results for input(s): AST, ALT, ALKPHOS, BILITOT, PROT, ALBUMIN in the last 168 hours. No results for input(s): LIPASE, AMYLASE in the last 168 hours. No results for input(s): AMMONIA in the last 168 hours. Coagulation Profile: No results for input(s): INR, PROTIME in the last 168 hours. Cardiac Enzymes: No results for input(s): CKTOTAL, CKMB, CKMBINDEX, TROPONINI in the last 168 hours. BNP (last 3 results) No results for input(s): PROBNP in the last 8760 hours. HbA1C: No results for input(s): HGBA1C in the last 72 hours. CBG: No results for input(s): GLUCAP in the last 168 hours. Lipid Profile: No results for input(s): CHOL, HDL, LDLCALC, TRIG, CHOLHDL, LDLDIRECT in the last 72 hours. Thyroid Function Tests: No results for input(s): TSH, T4TOTAL, FREET4, T3FREE, THYROIDAB in the last 72 hours. Anemia Panel: No results for input(s): VITAMINB12, FOLATE, FERRITIN, TIBC, IRON, RETICCTPCT in the last 72 hours. Sepsis Labs: No results for input(s): PROCALCITON, LATICACIDVEN in the last 168 hours.  Recent Results (from the past 240 hour(s))  SARS Coronavirus 2 by RT PCR (hospital order, performed in [facility] hospital lab) Nasopharyngeal Nasopharyngeal Swab     Status: None   Collection Time: 12/03/19  4:54 PM   Specimen: Nasopharyngeal Swab  Result Value Ref Range Status   SARS Coronavirus 2 NEGATIVE NEGATIVE Final    Comment: (NOTE) SARS-CoV-2 target nucleic acids are NOT DETECTED.  The SARS-CoV-2 RNA is generally  detectable in upper and lower respiratory specimens during the acute phase of infection. The lowest concentration of SARS-CoV-2 viral copies this assay can detect is 250 copies / mL. A negative result does not preclude SARS-CoV-2 infection and should not be used as the sole basis for treatment or other patient management decisions.  A negative result may occur with improper specimen collection / handling, submission of specimen other than nasopharyngeal swab, presence of viral mutation(s) within the areas targeted by this assay, and inadequate number of viral copies (<250 copies / mL). A negative result must be combined with clinical observations, patient history, and epidemiological information.  Fact Sheet for Patients:   StrictlyIdeas.no  Fact Sheet for Healthcare Providers: BankingDealers.co.za  This test is not yet approved or  cleared by the [place] FDA and has been authorized for detection and/or diagnosis of SARS-CoV-2 by FDA under an Emergency Use Authorization (EUA).  This EUA will remain in effect (meaning this test can be used) for the duration of the COVID-19 declaration under Section 564(b)(1) of the Act, 21 U.S.C. section 360bbb-3(b)(1), unless the authorization is terminated or revoked sooner.  Performed at [facility], [place] [address], [place], [place] [address]          Radiology Studies: DG Chest Portable 1 View  Result Date: 12/03/2019 CLINICAL DATA:  Short of breath EXAM: PORTABLE CHEST 1 VIEW COMPARISON:  01/12/2012 FINDINGS: Single frontal view of the chest demonstrates an unremarkable cardiac silhouette. There is widespread bilateral ground-glass airspace disease. Chronic blunting of the left costophrenic angle consistent with pleural thickening or scarring. No pneumothorax. IMPRESSION: 1. Widespread bilateral ground-glass airspace disease consistent with multifocal pneumonia or edema.  Electronically Signed   By: Randa Ngo M.D.   On: 12/03/2019 17:13        Scheduled Meds: . allopurinol  300 mg Oral Daily  . amiodarone  200 mg Oral Daily  . aspirin EC  81 mg Oral Daily  . [START ON 12/05/2019] cinacalcet  180 mg Oral Q T,Th,Sa-HD  . divalproex  250 mg Oral QHS  . heparin  5,000 Units Subcutaneous Q8H  . lamoTRIgine  25 mg Oral BID  . levETIRAcetam  500 mg Oral BID  . metoprolol tartrate  25 mg Oral QPM  . sodium chloride flush  3 mL Intravenous Q12H  . sodium chloride flush  3 mL Intravenous Q12H   Continuous Infusions: . sodium chloride    . azithromycin Stopped (12/04/19 0357)  . cefTRIAXone (ROCEPHIN)  IV Stopped (12/04/19 0344)     LOS: 0 days    Time spent: 35 minutes.     Elmarie Shiley, MD [facility]   If 7PM-7AM, please contact night-coverage [email or web address]  12/04/2019, 7:53 AM

## 2019-12-04 NOTE — Plan of Care (Signed)
  Problem: Education: Goal: Knowledge of General Education information will improve Description Including pain rating scale, medication(s)/side effects and non-pharmacologic comfort measures Outcome: Progressing   

## 2019-12-04 NOTE — ED Provider Notes (Signed)
[facility] EMERGENCY DEPARTMENT Provider Note   CSN: [id number] Arrival date & time: 12/03/19  1643   History Chief Complaint  Patient presents with  . Shortness of Breath    Peter Sloan is a 59 y.o. male.  The history is provided by the patient.  Shortness of Breath He has history of hypertension, hyperlipidemia, heart failure, atrial fibrillation and comes in because of dyspnea.  He is an extremely poor historian, but apparently has had shortness of breath with minimal exertion over the last week.  He states that gets short of breath going up steps.  I could not definitely tell from him that his symptoms are getting worse, but he states he is just tired of being so short of breath.  He did have a full dialysis session this afternoon without any improvement.  There has been a slight cough which is nonproductive.  He denies fever or chills.  He denies chest pain, heaviness, tightness, pressure.  He has been fully vaccinated against COVID-19 and denies any COVID-19 exposures.  Past Medical History:  Diagnosis Date  . Atrial fibrillation ([place])   . Chronic renal insufficiency    2/2 ischemic ATN, GFR 15-17 ml/min, baseline Cr: 4-4.5, never had a renal biopsy, s/p AV fistula placement in 2007, f/b nephrologist, Dr. Dorothe Pea, [place]  . Congenital solitary kidney   . Congestive heart failure ([place])   . End stage renal disease on dialysis [facility])    fistula left arm, dialysis on Tues, Thurs, Sat.  . Gout    h/o: many in Rt big toe and Rt Knee, never had aspirartion diagnosis  . High cholesterol   . Hyperparathyroidism ([place])   . 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 Nonischemic cardiomyopathy (HCC)    EF 20%(9/06) with most recent EF 50%(3/08), valves normal per 2D echo (3/08), normal doppler and color flow study(3/08) s/p endocmyocardial  biopsy: mild myocyte hypertrophy no myocyte damage or inflammation  . Physical debility   . 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 Seizure disorder ([place])    complex- partial, h/o sz disorder as a child  . Seizures ([place])    last sz 07/05/12 grand mal  . Severe protein-calorie malnutrition ([place])   . Thyroid disorder     Patient Active Problem List   Diagnosis Date Noted  . Hyperkalemia 08/13/2019  . Left hip pain 08/13/2019  . Postoperative anemia due to acute blood loss 11/18/2012  . End stage renal disease on dialysis ([place])   . 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 Severe protein-calorie malnutrition ([place])   . Atrial fibrillation ([place])   . Physical debility   . 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 Seizure disorder ([place]) 07/28/2012  . Hypokalemia 01/12/2012  . Hyperlipidemia 01/12/2012  . THROMBOCYTOPENIA 08/17/2008  . GOUT 09/18/2006  . CARDIOMYOPATHY 09/18/2006  . RENAL INSUFFICIENCY, CHRONIC 09/18/2006  . SEIZURE DISORDER 09/18/2006    Past Surgical History:  Procedure Laterality Date  . HERNIA REPAIR    . KIDNEY SURGERY    . KIDNEY TRANSPLANT  08/14/12  . NEPHRECTOMY TRANSPLANTED ORGAN  10/14/12  . TONSILLECTOMY         Family History  Problem Relation Age of Onset  . Osteoarthritis Mother   . Hypertension Father   . Muscular dystrophy Brother   . Heart disease Child        one 50 y/o son has MVP    Social History   Tobacco Use  .  Smoking status: Never Smoker  . Smokeless tobacco: Never Used  Substance Use Topics  . Alcohol use: No    Comment: social drinker  . Drug use: No    Home Medications Prior to Admission medications   Medication Sig Start Date End Date Taking? Authorizing Provider  acetaminophen (TYLENOL) 500 MG tablet Take 500 mg by mouth every 6 (six) hours as needed for mild pain.    [provider]  allopurinol (ZYLOPRIM) 300 MG tablet Take 300 mg by mouth daily. 09/29/12   [provider]  amiodarone (PACERONE) 200 MG tablet Take 200 mg by mouth daily.    [provider]  Ascorbic Acid (VITAMIN C) 1000 MG tablet  Take 1,000 mg by mouth daily.    [provider]  aspirin 81 MG tablet Take 81 mg by mouth every morning.     [provider]  AURYXIA 1 GM 210 MG(Fe) tablet Take 420 mg by mouth daily as needed for constipation. 06/20/19   [provider]  b complex-vitamin c-folic acid (NEPHRO-VITE) 0.8 MG TABS tablet Take 0.8 mg by mouth daily.     [provider]  divalproex (DEPAKOTE ER) 250 MG 24 hr tablet Take 1 tablet (250 mg total) by mouth daily. 09/23/19   Lomax, Amy, NP  lamoTRIgine (LAMICTAL) 25 MG tablet Take 1 tablet (25 mg total) by mouth 2 (two) times daily. 09/23/19   Lomax, Amy, NP  levETIRAcetam (KEPPRA) 500 MG tablet Take 1 tablet (500 mg total) by mouth 2 (two) times daily. 09/23/19   Lomax, Amy, NP  Omega-3 Fatty Acids (FISH OIL) 1000 MG CAPS Take by mouth.    [provider]  SENSIPAR 60 MG tablet Take 180 mg by mouth daily.  07/21/15   [provider]    Allergies    Diphenhydramine hcl  Review of Systems   Review of Systems  Respiratory: Positive for shortness of breath.   All other systems reviewed and are negative.   Physical Exam Updated Vital Signs BP 136/63 (BP Location: Right Arm)   Pulse 88   Temp 97.8 F (36.6 C) (Oral)   Resp 20   Ht 5\' 3"  (1.6 m)   Wt 68.9 kg   SpO2 98%   BMI 26.93 kg/m   Physical Exam Vitals and nursing note reviewed.   59 year old male, resting comfortably and in no acute distress. Vital signs are normal. Oxygen saturation is 96%, which is normal. Head is normocephalic and atraumatic. PERRLA, EOMI. Oropharynx is clear. Neck is nontender and supple without adenopathy or JVD. Back is nontender and there is no CVA tenderness. Lungs have a few faint rales in the left upper lung fields without wheezes or rhonchi. Chest is nontender. Heart has regular rate and rhythm without murmur. Abdomen is soft, flat, nontender without masses or hepatosplenomegaly and peristalsis is  normoactive. Extremities have no cyanosis or edema, full range of motion is present.  AV fistula present in the left forearm with thrill present. Skin is warm and dry without rash. Neurologic: Mental status is normal, cranial nerves are intact, there are no motor or sensory deficits.  ED Results / Procedures / Treatments   Labs (all labs ordered are listed, but only abnormal results are displayed) Labs Reviewed  BASIC METABOLIC PANEL - Abnormal; Notable for the following components:      Result Value   Potassium 3.2 (*)    Chloride 93 (*)    Glucose, Bld 117 (*)  Creatinine, Ser 3.84 (*)    Calcium 8.7 (*)    GFR calc non Af Amer 16 (*)    GFR calc Af Amer 19 (*)    All other components within normal limits  CBC - Abnormal; Notable for the following components:   WBC 10.9 (*)    RBC 3.36 (*)    Hemoglobin 11.3 (*)    HCT 33.9 (*)    MCV 100.9 (*)    All other components within normal limits  TROPONIN I (HIGH SENSITIVITY) - Abnormal; Notable for the following components:   Troponin I (High Sensitivity) 65 (*)    All other components within normal limits  TROPONIN I (HIGH SENSITIVITY) - Abnormal; Notable for the following components:   Troponin I (High Sensitivity) 60 (*)    All other components within normal limits  SARS CORONAVIRUS 2 BY RT PCR (HOSPITAL ORDER, [facility])   Radiology DG Chest Portable 1 View  Result Date: 12/03/2019 CLINICAL DATA:  Short of breath EXAM: PORTABLE CHEST 1 VIEW COMPARISON:  01/12/2012 FINDINGS: Single frontal view of the chest demonstrates an unremarkable cardiac silhouette. There is widespread bilateral ground-glass airspace disease. Chronic blunting of the left costophrenic angle consistent with pleural thickening or scarring. No pneumothorax. IMPRESSION: 1. Widespread bilateral ground-glass airspace disease consistent with multifocal pneumonia or edema. Electronically Signed   By: Randa Ngo M.D.   On: 12/03/2019  17:13    Procedures Procedures  Medications Ordered in ED Medications - No data to display  ED Course  I have reviewed the triage vital signs and the nursing notes.  Pertinent labs & imaging results that were available during my care of the patient were reviewed by me and considered in my medical decision making (see chart for details).  MDM Rules/Calculators/A&P Dyspnea of uncertain cause.  He was noted to be hypoxic in triage with oxygen saturation of 85% on room air which improved with placing him on nasal oxygen.  When I turned his oxygen off, saturations returned to below 90%.  He does not appear to be fluid overloaded and symptoms did not improve with dialysis.  Chest x-ray shows widespread bilateral groundglass acid airspace disease consistent with multifocal pneumonia or edema.  Clinically, he does not have pneumonia.  WBC is only minimally elevated.  Hemoglobin is stable.  Mild hypokalemia is present is given a dose of oral potassium.  He will need further evaluation as to what is causing his pulmonary infiltrates and hypoxia.  Covid PCR test is negative.  Case is discussed with Dr. Myna Hidalgo of [facility], who agrees to admit the patient.  Final Clinical Impression(s) / ED Diagnoses Final diagnoses:  Hypoxia  End-stage renal disease on hemodialysis ([place])  Elevated troponin  Macrocytic anemia  Hypokalemia  Pulmonary infiltrates    Rx / DC Orders ED Discharge Orders    None       Delora Fuel, MD 54/65/03 [phone]

## 2019-12-04 NOTE — Progress Notes (Signed)
Echocardiogram: Poor LV systolic function EF 50-35 %.Severe MR and mild TR. Discussed possible R + L heart catheterization. Also discussed warfarin use. Patient had bleeding episodes from dialysis site hence warfarin was discontinued. Last catheterization was 15 years ago and he had non-ischemic cardiomyopathy. His last seizure was 10 years ago.  Assessment: Aacute on chronic systolic left heart failure, HFrEF Acute pulmonary edema Possible multifocal pneumonia ESRD  Will wait till pneumonia is improved for cardiac interventions.. Awaiting dialysis tomorrow. Heart catheterization as IP or OP in near future. Consider low dose warfarin as tolerated. Add digoxin for severe LV systolic dysfunction. Dr. Terrence Dupont, covering the weekend.  Dixie Dials, MD 6:49 PM on 12/04/2019

## 2019-12-04 NOTE — Progress Notes (Signed)
  Echocardiogram 2D Echocardiogram has been performed.  Peter Sloan 12/04/2019, 11:04 AM

## 2019-12-05 LAB — BASIC METABOLIC PANEL
Anion gap: 17 — ABNORMAL HIGH (ref 5–15)
BUN: 27 mg/dL — ABNORMAL HIGH (ref 6–20)
CO2: 25 mmol/L (ref 22–32)
Calcium: 9.4 mg/dL (ref 8.9–10.3)
Chloride: 97 mmol/L — ABNORMAL LOW (ref 98–111)
Creatinine, Ser: 4.92 mg/dL — ABNORMAL HIGH (ref 0.61–1.24)
GFR calc Af Amer: 14 mL/min — ABNORMAL LOW (ref >60)
GFR calc non Af Amer: 12 mL/min — ABNORMAL LOW (ref >60)
Glucose, Bld: 99 mg/dL (ref 70–99)
Potassium: 3.9 mmol/L (ref 3.5–5.1)
Sodium: 139 mmol/L (ref 135–145)

## 2019-12-05 LAB — LEGIONELLA PNEUMOPHILA SEROGP 1 UR AG: L. pneumophila Serogp 1 Ur Ag: NEGATIVE

## 2019-12-05 LAB — CBC
HCT: 32.4 % — ABNORMAL LOW (ref 39.0–52.0)
Hemoglobin: 10.5 g/dL — ABNORMAL LOW (ref 13.0–17.0)
MCH: 32.9 pg (ref 26.0–34.0)
MCHC: 32.4 g/dL (ref 30.0–36.0)
MCV: 101.6 fL — ABNORMAL HIGH (ref 80.0–100.0)
Platelets: 240 10*3/uL (ref 150–400)
RBC: 3.19 MIL/uL — ABNORMAL LOW (ref 4.22–5.81)
RDW: 13.7 % (ref 11.5–15.5)
WBC: 9.5 10*3/uL (ref 4.0–10.5)
nRBC: 0 % (ref 0.0–0.2)

## 2019-12-05 LAB — PHOSPHORUS: Phosphorus: 5.7 mg/dL — ABNORMAL HIGH (ref 2.5–4.6)

## 2019-12-05 LAB — PROCALCITONIN: Procalcitonin: 3.2 ng/mL

## 2019-12-05 LAB — MRSA PCR SCREENING: MRSA by PCR: NEGATIVE

## 2019-12-05 MED ORDER — SODIUM CHLORIDE 0.9 % IV SOLN
1.0000 g | INTRAVENOUS | Status: DC
  Administered 2019-12-05 – 2019-12-08 (×4): 1 g via INTRAVENOUS
  Filled 2019-12-05 (×5): qty 1

## 2019-12-05 MED ORDER — SORBITOL 70 % SOLN
30.0000 mL | Freq: Every day | Status: DC | PRN
  Administered 2019-12-05: 30 mL via ORAL
  Filled 2019-12-05: qty 30

## 2019-12-05 MED ORDER — HEPARIN SODIUM (PORCINE) 1000 UNIT/ML IJ SOLN
INTRAMUSCULAR | Status: AC
  Filled 2019-12-05: qty 4

## 2019-12-05 MED ORDER — HEPARIN SODIUM (PORCINE) 1000 UNIT/ML IJ SOLN
INTRAMUSCULAR | Status: AC
  Filled 2019-12-05: qty 6

## 2019-12-05 NOTE — Progress Notes (Signed)
Pharmacy Antibiotic Note  Peter Sloan is a 59 y.o. male with ESRD, rising procalcitonin and PNA .  Pharmacy has been consulted for Cefepime dosing.  Plan: Cefepime 1 g IV q24h F/U HD schedule  Height: 5\' 3"  (160 cm) Weight: 68.9 kg (152 lb) IBW/kg (Calculated) : 56.9  Temp (24hrs), Avg:97.8 F (36.6 C), Min:97.5 F (36.4 C), Max:98.4 F (36.9 C)  Recent Labs  Lab 12/03/19 1656 12/04/19 0635 12/05/19 0420  WBC 10.9* 10.5 9.5  CREATININE 3.84* 5.44* 4.92*    Estimated Creatinine Clearance: 14.5 mL/min (A) (by C-G formula based on SCr of 4.92 mg/dL (H)).    Allergies  Allergen Reactions  . Diphenhydramine Hcl Palpitations     Peter Sloan, Peter Sloan 12/05/2019 7:34 AM

## 2019-12-05 NOTE — Progress Notes (Signed)
PROGRESS NOTE    Peter Sloan  AGT:[id number] DOB: 17-Apr-1960 DOA: 12/03/2019 PCP: Mauricia Area, MD   Brief Narrative: 59 year old with past medical history significant for ESRD on hemodialysis, cardiomyopathy with ejection fraction 30 to 35%, seizure disorder, paroxysmal atrial fibrillation not on anticoagulation, presents complaining of worsening shortness of breath and cough for the last 1 or 2 weeks.  He report productive cough.  Evaluation in the ED patient was found to be afebrile, hypoxic 85 on room air, tachypneic, chest x-ray with widespread bilateral groundglass airspace disease suggestive of multifocal pneumonia or edema.  Elevated BNP, mild elevation of troponin.  Covid PCR negative.    Assessment & Plan:   Principal Problem:   Acute respiratory failure with hypoxia (HCC) Active Problems:   Hypokalemia   Seizure disorder (HCC)   End stage renal disease on dialysis [facility])   Atrial fibrillation (HCC)  1-Acute Hypoxic Respiratory Failure secondary to Acute systolic heart failure exacerbation and or pneumonia: -Patient presented with hypoxemia, dyspnea, elevated BNP 4000, chest x-ray asymmetric edema versus pneumonia. Prior EF 35 %. -Nephrology consulted. Had HD 9/10 and 9/11. -Will decrease oxygen to 5 L and monitor  -I will continue with IV antibiotics azithromycin.  -Sputum culture pending.  -Pro-calcitonin increase to 3, will change ceftriaxone to cefepime to cover for gram negative rods.  -Continue with  Guaifenesin.  -Flutter valve.   2-Acute systolic heart failure exacerbation, paroxysmal A. fib: Cardiology consulted Echo with lower EF at 20, Severe mitral regurgitation.  Cardiology recommending cath in or out patient. Awaiting improvement of PNA>   3-Hypokalemia: Resolved  4-ESRD: He had hemodialysis on 9/9 and 9/11 -Chest x-ray with pulmonary edema versus pneumonia.  BNP elevated.    5-Paroxysmal A. Fib: Continue with amiodarone Cardiology  consulted Defer to cardiology anticoagulation  6-Mild elevation of troponin: Related to demand ischemia in the setting of heart failure exacerbation. Cardiology following. 7-Seizure disorder: Continue with Keppra and Depakote 8-Metabolic Bone diseases. Check phosphorus level. Discussed with Dr Jonnie Finner, follow phosphorus leve and they will decide on resumption of Auryxia.  9-constipation; Sorbitol PRN ordered.  10-Nutrition; nutrition consulted.   Estimated body mass index is 26.4 kg/m as calculated from the following:   Height as of this encounter: 5\' 3"  (1.6 m).   Weight as of this encounter: 67.6 kg.   DVT prophylaxis: Heparin Code Status: Full code Family Communication: Care discussed with patient and sister Disposition Plan:  Status is: Inpatient  Remains inpatient appropriate because:IV treatments appropriate due to intensity of illness or inability to take PO   Dispo: The patient is from: Home              Anticipated d/c is to: Home              Anticipated d/c date is: 3 days              Patient currently is not medically stable to d/c.        Consultants:   Nephrology  Cardiology  Procedures:   HD  ECHO;  Antimicrobials:  Ceftriaxone 9/10 Azithromycin 9/10.  Subjective: He report some improvement of dyspnea., he will like to be out of bed.  He has not been able to eat well.   Objective: Vitals:   12/05/19 0830 12/05/19 0900 12/05/19 0930 12/05/19 1000  BP: 102/80 92/66 103/62 108/61  Pulse:  (!) 126 94 (!) 102  Resp:  (!) 36 (!) 32 (!) 39  Temp:      TempSrc:  SpO2:  100% 100% 100%  Weight:      Height:        Intake/Output Summary (Last 24 hours) at 12/05/2019 1035 Last data filed at 12/05/2019 0500 Gross per 24 hour  Intake 755.12 ml  Output 2500 ml  Net -1744.88 ml   Filed Weights   12/04/19 0049 12/05/19 0645  Weight: 68.9 kg 67.6 kg    Examination:  General exam: NAD Respiratory system: Respiratory effort normal,  bilateral crackles.  Cardiovascular system: S 1, S 2 RRR Gastrointestinal system: BS present, soft, nt Central nervous system: Alert and oriented Extremities: Symmetric power Skin: No rashes   Data Reviewed: I have personally reviewed following labs and imaging studies  CBC: Recent Labs  Lab 12/03/19 1656 12/04/19 0635 12/05/19 0420  WBC 10.9* 10.5 9.5  HGB 11.3* 10.8* 10.5*  HCT 33.9* 33.3* 32.4*  MCV 100.9* 102.1* 101.6*  PLT 308 280 098   Basic Metabolic Panel: Recent Labs  Lab 12/03/19 1656 12/04/19 0635 12/05/19 0420  NA 137 137 139  K 3.2* 4.4 3.9  CL 93* 93* 97*  CO2 31 29 25   GLUCOSE 117* 113* 99  BUN 12 23* 27*  CREATININE 3.84* 5.44* 4.92*  CALCIUM 8.7* 9.0 9.4  MG  --  2.1  --    GFR: Estimated Creatinine Clearance: 13.3 mL/min (A) (by C-G formula based on SCr of 4.92 mg/dL (H)). Liver Function Tests: No results for input(s): AST, ALT, ALKPHOS, BILITOT, PROT, ALBUMIN in the last 168 hours. No results for input(s): LIPASE, AMYLASE in the last 168 hours. No results for input(s): AMMONIA in the last 168 hours. Coagulation Profile: No results for input(s): INR, PROTIME in the last 168 hours. Cardiac Enzymes: No results for input(s): CKTOTAL, CKMB, CKMBINDEX, TROPONINI in the last 168 hours. BNP (last 3 results) No results for input(s): PROBNP in the last 8760 hours. HbA1C: No results for input(s): HGBA1C in the last 72 hours. CBG: No results for input(s): GLUCAP in the last 168 hours. Lipid Profile: No results for input(s): CHOL, HDL, LDLCALC, TRIG, CHOLHDL, LDLDIRECT in the last 72 hours. Thyroid Function Tests: No results for input(s): TSH, T4TOTAL, FREET4, T3FREE, THYROIDAB in the last 72 hours. Anemia Panel: No results for input(s): VITAMINB12, FOLATE, FERRITIN, TIBC, IRON, RETICCTPCT in the last 72 hours. Sepsis Labs: Recent Labs  Lab 12/04/19 0635 12/05/19 0420  PROCALCITON 1.88 3.20    Recent Results (from the past 240 hour(s))  SARS  Coronavirus 2 by RT PCR (hospital order, performed in [facility] hospital lab) Nasopharyngeal Nasopharyngeal Swab     Status: None   Collection Time: 12/03/19  4:54 PM   Specimen: Nasopharyngeal Swab  Result Value Ref Range Status   SARS Coronavirus 2 NEGATIVE NEGATIVE Final    Comment: (NOTE) SARS-CoV-2 target nucleic acids are NOT DETECTED.  The SARS-CoV-2 RNA is generally detectable in upper and lower respiratory specimens during the acute phase of infection. The lowest concentration of SARS-CoV-2 viral copies this assay can detect is 250 copies / mL. A negative result does not preclude SARS-CoV-2 infection and should not be used as the sole basis for treatment or other patient management decisions.  A negative result may occur with improper specimen collection / handling, submission of specimen other than nasopharyngeal swab, presence of viral mutation(s) within the areas targeted by this assay, and inadequate number of viral copies (<250 copies / mL). A negative result must be combined with clinical observations, patient history, and epidemiological information.  Fact Sheet for  Patients:   StrictlyIdeas.no  Fact Sheet for Healthcare Providers: BankingDealers.co.za  This test is not yet approved or  cleared by the [place] FDA and has been authorized for detection and/or diagnosis of SARS-CoV-2 by FDA under an Emergency Use Authorization (EUA).  This EUA will remain in effect (meaning this test can be used) for the duration of the COVID-19 declaration under Section 564(b)(1) of the Act, 21 U.S.C. section 360bbb-3(b)(1), unless the authorization is terminated or revoked sooner.  Performed at [facility], [place] [address] [place], [place] [address]   Culture, sputum-assessment     Status: None   Collection Time: 12/04/19 12:10 PM   Specimen: Expectorated Sputum  Result Value Ref Range Status   Specimen Description  EXPECTORATED SPUTUM  Final   Special Requests NONE  Final   Sputum evaluation   Final    Sputum specimen not acceptable for testing.  Please recollect.   J,FOKUO RN @[phone]  12/04/19 EB Performed at [facility] [address] [place], [place] [address]    Report Status 12/04/2019 FINAL  Final  MRSA PCR Screening     Status: None   Collection Time: 12/04/19 11:43 PM   Specimen: Nasopharyngeal  Result Value Ref Range Status   MRSA by PCR NEGATIVE NEGATIVE Final    Comment:        The GeneXpert MRSA Assay (FDA approved for NASAL specimens only), is one component of a comprehensive MRSA colonization surveillance program. It is not intended to diagnose MRSA infection nor to guide or monitor treatment for MRSA infections. Performed at [facility], [place] [address] [place], [place] [address]          Radiology Studies: DG Chest Portable 1 View  Result Date: 12/03/2019 CLINICAL DATA:  Short of breath EXAM: PORTABLE CHEST 1 VIEW COMPARISON:  01/12/2012 FINDINGS: Single frontal view of the chest demonstrates an unremarkable cardiac silhouette. There is widespread bilateral ground-glass airspace disease. Chronic blunting of the left costophrenic angle consistent with pleural thickening or scarring. No pneumothorax. IMPRESSION: 1. Widespread bilateral ground-glass airspace disease consistent with multifocal pneumonia or edema. Electronically Signed   By: Randa Ngo M.D.   On: 12/03/2019 17:13   ECHOCARDIOGRAM COMPLETE  Result Date: 12/04/2019    ECHOCARDIOGRAM REPORT   Patient Name:   KALA GASSMANN [facility] Date of Exam: 12/04/2019 Medical Rec #:  [id number]          Height:       63.0 in Accession #:    [id number]         Weight:       152.0 lb Date of Birth:  [birth year]          BSA:          1.721 m Patient Age:    46 years           BP:           117/94 mmHg Patient Gender: M                  HR:           90 bpm. Exam Location:  Inpatient Procedure: 2D Echo, Cardiac Doppler,  Color Doppler and Intracardiac            Opacification Agent Indications:    CHF  History:        Patient has no prior history of Echocardiogram examinations. CHF  and Cardiomyopathy, Arrythmias:Atrial Fibrillation,                 Signs/Symptoms:Shortness of Breath; Risk Factors:Dyslipidemia.                 ESRD, kidney transplant.  Sonographer:    Dustin Flock RDCS Referring Phys: [place]  1. Left ventricular ejection fraction, by estimation, is 20 to 25%. The left ventricle has severely decreased function. The left ventricle demonstrates global hypokinesis. The left ventricular internal cavity size was mildly dilated. Left ventricular diastolic parameters were normal.  2. Right ventricular systolic function is moderately reduced. The right ventricular size is normal. There is mildly elevated pulmonary artery systolic pressure.  3. Left atrial size was severely dilated.  4. Right atrial size was moderately dilated.  5. The mitral valve is degenerative. Severe mitral valve regurgitation. No evidence of mitral stenosis.  6. The aortic valve is tricuspid. Aortic valve regurgitation is not visualized.  7. The inferior vena cava is dilated in size with <50% respiratory variability, suggesting right atrial pressure of 15 mmHg. FINDINGS  Left Ventricle: Left ventricular ejection fraction, by estimation, is 20 to 25%. The left ventricle has severely decreased function. The left ventricle demonstrates global hypokinesis. Definity contrast agent was given IV to delineate the left ventricular endocardial borders. The left ventricular internal cavity size was mildly dilated. There is no left ventricular hypertrophy. Left ventricular diastolic parameters were normal. Right Ventricle: The right ventricular size is normal. No increase in right ventricular wall thickness. Right ventricular systolic function is moderately reduced. There is mildly elevated pulmonary artery systolic  pressure. The tricuspid regurgitant velocity is 2.70 m/s, and with an assumed right atrial pressure of 15 mmHg, the estimated right ventricular systolic pressure is 69.6 mmHg. Left Atrium: Left atrial size was severely dilated. Right Atrium: Right atrial size was moderately dilated. Pericardium: There is no evidence of pericardial effusion. Mitral Valve: The mitral valve is degenerative in appearance. There is mild thickening of the mitral valve leaflet(s). There is mild calcification of the mitral valve leaflet(s). Mild mitral annular calcification. Severe mitral valve regurgitation. No evidence of mitral valve stenosis. Tricuspid Valve: The tricuspid valve is normal in structure. Tricuspid valve regurgitation is mild. Aortic Valve: The aortic valve is tricuspid. Aortic valve regurgitation is not visualized. Pulmonic Valve: The pulmonic valve was normal in structure. Pulmonic valve regurgitation is trivial. Aorta: The aortic root is normal in size and structure. There is minimal (Grade I) atheroma plaque involving the aortic root and ascending aorta. Venous: The inferior vena cava is dilated in size with less than 50% respiratory variability, suggesting right atrial pressure of 15 mmHg. IAS/Shunts: The interatrial septum was not assessed.  LEFT VENTRICLE PLAX 2D LVIDd:         5.80 cm  Diastology LVIDs:         5.50 cm  LV e' medial:    5.22 cm/s LV PW:         1.00 cm  LV E/e' medial:  17.1 LV IVS:        1.00 cm  LV e' lateral:   4.79 cm/s LVOT diam:     2.00 cm  LV E/e' lateral: 18.6 LV SV:         45 LV SV Index:   26 LVOT Area:     3.14 cm  RIGHT VENTRICLE RV Basal diam:  4.30 cm RV S prime:     9.25 cm/s TAPSE (M-mode): 2.4 cm LEFT  ATRIUM              Index       RIGHT ATRIUM           Index LA diam:        4.60 cm  2.67 cm/m  RA Area:     18.70 cm LA Vol (A2C):   105.0 ml 61.02 ml/m RA Volume:   52.80 ml  30.68 ml/m LA Vol (A4C):   61.7 ml  35.86 ml/m LA Biplane Vol: 81.2 ml  47.19 ml/m  AORTIC VALVE  LVOT Vmax:   93.80 cm/s LVOT Vmean:  53.400 cm/s LVOT VTI:    0.142 m  AORTA Ao Root diam: 3.10 cm MITRAL VALVE               TRICUSPID VALVE MV Area (PHT): 5.13 cm    TR Peak grad:   29.2 mmHg MV Decel Time: 148 msec    TR Vmax:        270.00 cm/s MV E velocity: 89.20 cm/s MV A velocity: 70.80 cm/s  SHUNTS MV E/A ratio:  1.26        Systemic VTI:  0.14 m                            Systemic Diam: 2.00 cm Dixie Dials MD Electronically signed by Dixie Dials MD Signature Date/Time: 12/04/2019/6:02:40 PM    Final         Scheduled Meds: . allopurinol  300 mg Oral Daily  . amiodarone  200 mg Oral Daily  . aspirin EC  81 mg Oral Daily  . calcitRIOL  1 mcg Oral Q T,Th,Sa-HD  . Chlorhexidine Gluconate Cloth  6 each Topical Q0600  . cinacalcet  180 mg Oral Q T,Th,Sa-HD  . digoxin  0.25 mg Oral Daily  . divalproex  250 mg Oral QHS  . guaiFENesin  600 mg Oral BID  . heparin  5,000 Units Subcutaneous Q8H  . heparin sodium (porcine)      . hydrALAZINE  25 mg Oral QID  . lamoTRIgine  25 mg Oral BID  . levETIRAcetam  500 mg Oral BID  . metoprolol tartrate  25 mg Oral QPM  . sodium chloride flush  3 mL Intravenous Q12H  . sodium chloride flush  3 mL Intravenous Q12H   Continuous Infusions: . sodium chloride    . azithromycin 500 mg (12/04/19 2340)  . ceFEPime (MAXIPIME) IV 1 g (12/05/19 0953)     LOS: 1 day    Time spent: 35 minutes.     Elmarie Shiley, MD [facility]   If 7PM-7AM, please contact night-coverage [email or web address]  12/05/2019, 10:35 AM

## 2019-12-05 NOTE — Progress Notes (Signed)
[facility] Progress Note  Subjective: ECHO done her shows EF 20-25%, was 30-35% prior (last echo here was June 2010). Peter Sloan's breathing somewhat better, "I won't know until I walk".  2.5 L removed today on HD.    Vitals:   12/05/19 0930 12/05/19 1000 12/05/19 1030 12/05/19 1055  BP: 103/62 108/61 111/71 125/70  Pulse: 94 (!) 102 96 97  Resp: (!) 32 (!) 39 (!) 27   Temp:      TempSrc:      SpO2: 100% 100% 100%   Weight:      Height:        Exam:   alert, nad , ill appearing  no jvd  Chest occ R base rales, o/w clear  Cor reg no RG  Abd soft ntnd no ascites   Ext leg edema better, trace today   Alert, NF, ox3      OP HD: TTS [facility]   4h    69.5kg  2/2 bath   LFA AVG  Hep 6800  Sensipar 180 mg PO TIW -Calcitriol 1.0 mcg PO TIW (last PTH 744 11/05/2019)    Assessment/ Plan: 1. Pulmonary edema/Volume overload: Has been leaving under EDW at OP center, H/O HFrEF. 2.5 L off yest and another 2.5 L off today, has lost body wt. Also has lower EF than usual (30% > 20%) and cardiology is evaluating. If needed can do more HD on Monday, will have CXR tomorrow am to f/u infiltrates/ edema.  2.  Possible PNA - ABX per primary.  3.  ESRD - usual HD TTS.  HD yest and today to get back on sched 4.  Hypertension/volume  - As noted above. No antihypertensive meds on OP med list. HTN is volume controlled. UF as tolerated.  5.  Anemia  - HGB 11.3 No ESA needed. 6.  Metabolic bone disease -Chronic issues with hyperphosphatemia as OP. Continue binders, VDRA, Sensipar (Ca 9.0). Order RFP with HD tomorrow to check PO4.  7.  Nutrition - Renal diet with fld restrictions.  8.   H/O Afib. Not on anticoagulation. On amiodarone, metoprolol.  9.   H/O HFrEF. Repeat ECHO EF down 20-25% (was 30-35%). Dr. Doylene Canard consulting.      Rob Bengie Kaucher 12/05/2019, 12:27 PM   Recent Labs  Lab 12/04/19 0635 12/05/19 0420  K 4.4 3.9  BUN 23* 27*  CREATININE 5.44* 4.92*  CALCIUM 9.0 9.4  HGB 10.8*  10.5*   Inpatient medications: . allopurinol  300 mg Oral Daily  . amiodarone  200 mg Oral Daily  . aspirin EC  81 mg Oral Daily  . calcitRIOL  1 mcg Oral Q T,Th,Sa-HD  . Chlorhexidine Gluconate Cloth  6 each Topical Q0600  . cinacalcet  180 mg Oral Q T,Th,Sa-HD  . digoxin  0.25 mg Oral Daily  . divalproex  250 mg Oral QHS  . guaiFENesin  600 mg Oral BID  . heparin  5,000 Units Subcutaneous Q8H  . heparin sodium (porcine)      . hydrALAZINE  25 mg Oral QID  . lamoTRIgine  25 mg Oral BID  . levETIRAcetam  500 mg Oral BID  . metoprolol tartrate  25 mg Oral QPM  . sodium chloride flush  3 mL Intravenous Q12H  . sodium chloride flush  3 mL Intravenous Q12H   . sodium chloride    . azithromycin 500 mg (12/04/19 2340)  . ceFEPime (MAXIPIME) IV 1 g (12/05/19 0953)   sodium chloride, acetaminophen **OR** acetaminophen, melatonin,  ondansetron **OR** ondansetron (ZOFRAN) IV, sodium chloride flush, sorbitol

## 2019-12-05 NOTE — Procedures (Signed)
   I was present at this dialysis session, have reviewed the session itself and made  appropriate changes Kelly Splinter MD [facility] pager [phone]   12/05/2019, 9:35 AM

## 2019-12-05 NOTE — Evaluation (Signed)
Physical Therapy Evaluation Patient Details Name: Peter Sloan MRN: [id number] DOB: 12/26/1960 Today's Date: 12/05/2019   History of Present Illness  The pt is a 59 yo male presenting with SOB and hypoxic (85% on RA) at rest. Echo revealed EF of 20-25%, severe mitral regurgitation with plan for heart cath in future. PMH includes: HTN, HLD, HF, renal insufficiency, ESRD on HD TTS, and afib.    Clinical Impression  Pt in bed upon arrival of PT, agreeable to evaluation at this time. Prior to admission the pt was mobilizing independently without use of an AD, and able to complete all ADLs without assistance. He was increasingly fatigued with the 15 steps to enter his apt, but was still able to manage the stairs without assist. The pt now presents with limitations in functional mobility, activity tolerance, and dynamic stability due to above dx, and will continue to benefit from skilled PT to address these deficits. The pt was able to demo good stability with bed mobility and transfers without use of AD, but benefits from minG and intermittent UE support for gait and had a few minor LOB requiring minA to steady. The pt was able to ambulate on 6L O2 with no reports of SOB, and SpO2 reading 93% upon return to room (poor SpO2 reading during gait). The pt will continue to benefit from skilled PT to progress strength, endurance, and stability with gait to facilitate return to prior level of mobility and independence at home.      Follow Up Recommendations No PT follow up;Supervision for mobility/OOB    Equipment Recommendations  Other (comment) (pt has needed equipment at home)    Recommendations for Other Services       Precautions / Restrictions Precautions Precautions: Fall Restrictions Weight Bearing Restrictions: No      Mobility  Bed Mobility Overal bed mobility: Needs Assistance Bed Mobility: Supine to Sit     Supine to sit: Min guard     General bed mobility comments: minG  with line managment for safety, HOB elevated  Transfers Overall transfer level: Needs assistance Equipment used: None Transfers: Sit to/from Stand Sit to Stand: Supervision         General transfer comment: supervision for safety, able to complete from various surfaces without assist to power up  Ambulation/Gait Ambulation/Gait assistance: Min guard Gait Distance (Feet): 15 Feet (+ 159ft) Assistive device: None Gait Pattern/deviations: Step-through pattern;Decreased stride length;Drifts right/left   Gait velocity interpretation: <1.8 ft/sec, indicate of risk for recurrent falls General Gait Details: short, shuffling steps with minimal clearance but no LOB on short ambulation in room. able to progress to 163ft hallway ambulation with intermittent single UE support through HHA, but largely ambulating without support, a few minor LOB with miNA to correct. poor SpO2 reading. 93% on 6L upon return to room  Stairs Stairs:  (pt too fatigued today, has 15 steps to enter his apt)             Balance Overall balance assessment: Needs assistance Sitting-balance support: No upper extremity supported;Feet supported Sitting balance-Leahy Scale: Good     Standing balance support: Single extremity supported;During functional activity Standing balance-Leahy Scale: Fair Standing balance comment: intermittent UE support for gait                             Pertinent Vitals/Pain Pain Assessment: No/denies pain    Home Living Family/patient expects to be discharged to:: Private residence Living Arrangements:  Spouse/significant other;Children Available Help at Discharge: Family;Available 24 hours/day Type of Home: Apartment Home Access: Stairs to enter Entrance Stairs-Rails: Chemical engineer of Steps: 15 Home Layout: One level Home Equipment: Shower seat;Grab bars - tub/shower;Hand held shower head Additional Comments: pt reports he does not have any  equipment, but his brother has everything (RW, cane, BSC) and no longer needs it.    Prior Function Level of Independence: Independent         Comments: pt reports independent with mobility, independent with ADLs     Hand Dominance   Dominant Hand: Right    Extremity/Trunk Assessment   Upper Extremity Assessment Upper Extremity Assessment: Overall WFL for tasks assessed    Lower Extremity Assessment Lower Extremity Assessment: Overall WFL for tasks assessed    Cervical / Trunk Assessment Cervical / Trunk Assessment: Normal  Communication   Communication: No difficulties  Cognition Arousal/Alertness: Awake/alert Behavior During Therapy: WFL for tasks assessed/performed Overall Cognitive Status: Within Functional Limits for tasks assessed                                        General Comments General comments (skin integrity, edema, etc.): poor SpO2 reading during gait, 93% on 6L upon return to room        Assessment/Plan    PT Assessment Patient needs continued PT services  PT Problem List Decreased mobility;Decreased strength;Decreased balance;Decreased activity tolerance       PT Treatment Interventions DME instruction;Therapeutic exercise;Gait training;Stair training;Functional mobility training;Therapeutic activities;Patient/family education;Balance training    PT Goals (Current goals can be found in the Care Plan section)  Acute Rehab PT Goals Patient Stated Goal: return home, get stronger PT Goal Formulation: With patient Time For Goal Achievement: 12/19/19 Potential to Achieve Goals: Good    Frequency Min 3X/week   Barriers to discharge   pt does have 15 stairs to enter his home       AM-PAC PT "6 Clicks" Mobility  Outcome Measure Help needed turning from your back to your side while in a flat bed without using bedrails?: None Help needed moving from lying on your back to sitting on the side of a flat bed without using  bedrails?: A Little Help needed moving to and from a bed to a chair (including a wheelchair)?: A Little Help needed standing up from a chair using your arms (e.g., wheelchair or bedside chair)?: None Help needed to walk in hospital room?: A Little Help needed climbing 3-5 steps with a railing? : A Little 6 Click Score: 20    End of Session Equipment Utilized During Treatment: Gait belt;Oxygen (6L) Activity Tolerance: Patient tolerated treatment well;Patient limited by fatigue Patient left: in chair;with family/visitor present;with call bell/phone within reach Nurse Communication: Mobility status PT Visit Diagnosis: Difficulty in walking, not elsewhere classified (R26.2)    Time: 1884-1660 PT Time Calculation (min) (ACUTE ONLY): 44 min   Charges:   PT Evaluation $PT Eval Moderate Complexity: 1 Mod PT Treatments $Gait Training: 8-22 mins $Therapeutic Activity: 8-22 mins        Karma Ganja, PT, DPT   Acute Rehabilitation Department Pager #: [phone]  Otho Bellows 12/05/2019, 3:58 PM

## 2019-12-05 NOTE — Progress Notes (Signed)
Subjective:  Patient denies any chest pain or shortness of breath seen in hemodialysis unit tolerating hemodialysis.  Objective:  Vital Signs in the last 24 hours: Temp:  [97.5 F (36.4 C)-98.4 F (36.9 C)] 97.8 F (36.6 C) (09/11 0645) Pulse Rate:  [82-126] 96 (09/11 1030) Resp:  [17-46] 27 (09/11 1030) BP: (92-147)/(61-104) 111/71 (09/11 1030) SpO2:  [85 %-100 %] 100 % (09/11 1030) Weight:  [67.6 kg] 67.6 kg (09/11 0645)  Intake/Output from previous day: 09/10 0701 - 09/11 0700 In: 755.1 [IV Piggyback:755.1] Out: 2500  Intake/Output from this shift: No intake/output data recorded.  Physical Exam: Neck: no adenopathy, no carotid bruit, no JVD and supple, symmetrical, trachea midline Lungs: bilateral rhonchi noted Heart: regular rate and rhythm, S1, S2 normal and 2/6 systolic murmur noted Abdomen: soft, non-tender; bowel sounds normal; no masses,  no organomegaly Extremities: extremities normal, atraumatic, no cyanosis or edema  Lab Results: Recent Labs    12/04/19 0635 12/05/19 0420  WBC 10.5 9.5  HGB 10.8* 10.5*  PLT 280 240   Recent Labs    12/04/19 0635 12/05/19 0420  NA 137 139  K 4.4 3.9  CL 93* 97*  CO2 29 25  GLUCOSE 113* 99  BUN 23* 27*  CREATININE 5.44* 4.92*   No results for input(s): TROPONINI in the last 72 hours.  Invalid input(s): CK, MB Hepatic Function Panel No results for input(s): PROT, ALBUMIN, AST, ALT, ALKPHOS, BILITOT, BILIDIR, IBILI in the last 72 hours. No results for input(s): CHOL in the last 72 hours. No results for input(s): PROTIME in the last 72 hours.  Imaging: Imaging results have been reviewed and DG Chest Portable 1 View  Result Date: 12/03/2019 CLINICAL DATA:  Short of breath EXAM: PORTABLE CHEST 1 VIEW COMPARISON:  01/12/2012 FINDINGS: Single frontal view of the chest demonstrates an unremarkable cardiac silhouette. There is widespread bilateral ground-glass airspace disease. Chronic blunting of the left costophrenic  angle consistent with pleural thickening or scarring. No pneumothorax. IMPRESSION: 1. Widespread bilateral ground-glass airspace disease consistent with multifocal pneumonia or edema. Electronically Signed   By: Randa Ngo M.D.   On: 12/03/2019 17:13   ECHOCARDIOGRAM COMPLETE  Result Date: 12/04/2019    ECHOCARDIOGRAM REPORT   Patient Name:   BROC CASPERS [facility] Date of Exam: 12/04/2019 Medical Rec #:  [id number]          Height:       63.0 in Accession #:    [id number]         Weight:       152.0 lb Date of Birth:  Jan 20, 1961          BSA:          1.721 m Patient Age:    23 years           BP:           117/94 mmHg Patient Gender: M                  HR:           90 bpm. Exam Location:  Inpatient Procedure: 2D Echo, Cardiac Doppler, Color Doppler and Intracardiac            Opacification Agent Indications:    CHF  History:        Patient has no prior history of Echocardiogram examinations. CHF                 and Cardiomyopathy, Arrythmias:Atrial Fibrillation,  Signs/Symptoms:Shortness of Breath; Risk Factors:Dyslipidemia.                 ESRD, kidney transplant.  Sonographer:    Dustin Flock RDCS Referring Phys: [place]  1. Left ventricular ejection fraction, by estimation, is 20 to 25%. The left ventricle has severely decreased function. The left ventricle demonstrates global hypokinesis. The left ventricular internal cavity size was mildly dilated. Left ventricular diastolic parameters were normal.  2. Right ventricular systolic function is moderately reduced. The right ventricular size is normal. There is mildly elevated pulmonary artery systolic pressure.  3. Left atrial size was severely dilated.  4. Right atrial size was moderately dilated.  5. The mitral valve is degenerative. Severe mitral valve regurgitation. No evidence of mitral stenosis.  6. The aortic valve is tricuspid. Aortic valve regurgitation is not visualized.  7. The inferior vena cava is dilated  in size with <50% respiratory variability, suggesting right atrial pressure of 15 mmHg. FINDINGS  Left Ventricle: Left ventricular ejection fraction, by estimation, is 20 to 25%. The left ventricle has severely decreased function. The left ventricle demonstrates global hypokinesis. Definity contrast agent was given IV to delineate the left ventricular endocardial borders. The left ventricular internal cavity size was mildly dilated. There is no left ventricular hypertrophy. Left ventricular diastolic parameters were normal. Right Ventricle: The right ventricular size is normal. No increase in right ventricular wall thickness. Right ventricular systolic function is moderately reduced. There is mildly elevated pulmonary artery systolic pressure. The tricuspid regurgitant velocity is 2.70 m/s, and with an assumed right atrial pressure of 15 mmHg, the estimated right ventricular systolic pressure is 12.4 mmHg. Left Atrium: Left atrial size was severely dilated. Right Atrium: Right atrial size was moderately dilated. Pericardium: There is no evidence of pericardial effusion. Mitral Valve: The mitral valve is degenerative in appearance. There is mild thickening of the mitral valve leaflet(s). There is mild calcification of the mitral valve leaflet(s). Mild mitral annular calcification. Severe mitral valve regurgitation. No evidence of mitral valve stenosis. Tricuspid Valve: The tricuspid valve is normal in structure. Tricuspid valve regurgitation is mild. Aortic Valve: The aortic valve is tricuspid. Aortic valve regurgitation is not visualized. Pulmonic Valve: The pulmonic valve was normal in structure. Pulmonic valve regurgitation is trivial. Aorta: The aortic root is normal in size and structure. There is minimal (Grade I) atheroma plaque involving the aortic root and ascending aorta. Venous: The inferior vena cava is dilated in size with less than 50% respiratory variability, suggesting right atrial pressure of 15  mmHg. IAS/Shunts: The interatrial septum was not assessed.  LEFT VENTRICLE PLAX 2D LVIDd:         5.80 cm  Diastology LVIDs:         5.50 cm  LV e' medial:    5.22 cm/s LV PW:         1.00 cm  LV E/e' medial:  17.1 LV IVS:        1.00 cm  LV e' lateral:   4.79 cm/s LVOT diam:     2.00 cm  LV E/e' lateral: 18.6 LV SV:         45 LV SV Index:   26 LVOT Area:     3.14 cm  RIGHT VENTRICLE RV Basal diam:  4.30 cm RV S prime:     9.25 cm/s TAPSE (M-mode): 2.4 cm LEFT ATRIUM              Index  RIGHT ATRIUM           Index LA diam:        4.60 cm  2.67 cm/m  RA Area:     18.70 cm LA Vol (A2C):   105.0 ml 61.02 ml/m RA Volume:   52.80 ml  30.68 ml/m LA Vol (A4C):   61.7 ml  35.86 ml/m LA Biplane Vol: 81.2 ml  47.19 ml/m  AORTIC VALVE LVOT Vmax:   93.80 cm/s LVOT Vmean:  53.400 cm/s LVOT VTI:    0.142 m  AORTA Ao Root diam: 3.10 cm MITRAL VALVE               TRICUSPID VALVE MV Area (PHT): 5.13 cm    TR Peak grad:   29.2 mmHg MV Decel Time: 148 msec    TR Vmax:        270.00 cm/s MV E velocity: 89.20 cm/s MV A velocity: 70.80 cm/s  SHUNTS MV E/A ratio:  1.26        Systemic VTI:  0.14 m                            Systemic Diam: 2.00 cm Dixie Dials MD Electronically signed by Dixie Dials MD Signature Date/Time: 12/04/2019/6:02:40 PM    Final     Cardiac Studies:  Assessment/Plan:  Resolving acute systolic congestive heart failure. Bilateral multifocal pneumonia. 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 History of paroxysmal A. Fib. End-stage renal disease on hemodialysis. 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 Anemia of chronic disease. Seizure disorder. Plan Continue present management. Will add low-dose BiDil as blood pressure tolerates. Check old records. Hold anticoagulation for now as Dr.Kadakia Is planning to do cardiac catheterization once pneumonia is resolved Check chest x-ray in a.m.  LOS: 1 day    Charolette Forward 12/05/2019, 10:53 AM

## 2019-12-06 ENCOUNTER — Inpatient Hospital Stay (HOSPITAL_COMMUNITY): Payer: Medicare HMO

## 2019-12-06 LAB — MAGNESIUM: Magnesium: 2.2 mg/dL (ref 1.7–2.4)

## 2019-12-06 LAB — BASIC METABOLIC PANEL
Anion gap: 15 (ref 5–15)
BUN: 32 mg/dL — ABNORMAL HIGH (ref 6–20)
CO2: 25 mmol/L (ref 22–32)
Calcium: 10 mg/dL (ref 8.9–10.3)
Chloride: 98 mmol/L (ref 98–111)
Creatinine, Ser: 5.14 mg/dL — ABNORMAL HIGH (ref 0.61–1.24)
GFR calc Af Amer: 13 mL/min — ABNORMAL LOW (ref >60)
GFR calc non Af Amer: 11 mL/min — ABNORMAL LOW (ref >60)
Glucose, Bld: 140 mg/dL — ABNORMAL HIGH (ref 70–99)
Potassium: 3.2 mmol/L — ABNORMAL LOW (ref 3.5–5.1)
Sodium: 138 mmol/L (ref 135–145)

## 2019-12-06 LAB — CBC
HCT: 34.4 % — ABNORMAL LOW (ref 39.0–52.0)
Hemoglobin: 11.2 g/dL — ABNORMAL LOW (ref 13.0–17.0)
MCH: 32.7 pg (ref 26.0–34.0)
MCHC: 32.6 g/dL (ref 30.0–36.0)
MCV: 100.3 fL — ABNORMAL HIGH (ref 80.0–100.0)
Platelets: 280 10*3/uL (ref 150–400)
RBC: 3.43 MIL/uL — ABNORMAL LOW (ref 4.22–5.81)
RDW: 13.9 % (ref 11.5–15.5)
WBC: 8.7 10*3/uL (ref 4.0–10.5)
nRBC: 0 % (ref 0.0–0.2)

## 2019-12-06 LAB — HEPARIN LEVEL (UNFRACTIONATED): Heparin Unfractionated: 0.23 IU/mL — ABNORMAL LOW (ref 0.30–0.70)

## 2019-12-06 MED ORDER — CHLORHEXIDINE GLUCONATE CLOTH 2 % EX PADS: 6.0000 | MEDICATED_PAD | Freq: Every day | CUTANEOUS | Status: DC

## 2019-12-06 MED ORDER — SODIUM CHLORIDE 0.9 % IV SOLN
100.0000 mg | Freq: Two times a day (BID) | INTRAVENOUS | Status: DC
  Administered 2019-12-06 – 2019-12-09 (×7): 100 mg via INTRAVENOUS
  Filled 2019-12-06 (×8): qty 100

## 2019-12-06 MED ORDER — METOPROLOL TARTRATE 5 MG/5ML IV SOLN
2.5000 mg | Freq: Four times a day (QID) | INTRAVENOUS | Status: DC | PRN
  Administered 2019-12-06 (×2): 2.5 mg via INTRAVENOUS
  Filled 2019-12-06 (×2): qty 5

## 2019-12-06 MED ORDER — POTASSIUM CHLORIDE CRYS ER 20 MEQ PO TBCR
40.0000 meq | EXTENDED_RELEASE_TABLET | Freq: Once | ORAL | Status: AC
  Administered 2019-12-06: 40 meq via ORAL
  Filled 2019-12-06: qty 2

## 2019-12-06 MED ORDER — HEPARIN (PORCINE) 25000 UT/250ML-% IV SOLN
1150.0000 [IU]/h | INTRAVENOUS | Status: DC
  Administered 2019-12-06: 950 [IU]/h via INTRAVENOUS
  Administered 2019-12-07 – 2019-12-08 (×2): 1150 [IU]/h via INTRAVENOUS
  Filled 2019-12-06 (×3): qty 250

## 2019-12-06 MED ORDER — METOPROLOL TARTRATE 5 MG/5ML IV SOLN: 2.5000 mg | Freq: Once | INTRAVENOUS | Status: AC

## 2019-12-06 MED ORDER — SALINE SPRAY 0.65 % NA SOLN
1.0000 | NASAL | Status: DC | PRN
  Administered 2019-12-06: 1 via NASAL
  Filled 2019-12-06: qty 44

## 2019-12-06 MED ORDER — METOPROLOL TARTRATE 12.5 MG HALF TABLET
12.5000 mg | ORAL_TABLET | Freq: Two times a day (BID) | ORAL | Status: DC
  Administered 2019-12-06 – 2019-12-07 (×3): 12.5 mg via ORAL
  Filled 2019-12-06 (×3): qty 1

## 2019-12-06 NOTE — Progress Notes (Addendum)
OT Cancellation Note  Patient Details Name: Peter Sloan MRN: [id number] DOB: 1960-09-29   Cancelled Treatment:    Reason Eval/Treat Not Completed: Patient not medically ready  Pt with AFIB and RVR with continued work up underway. Mention from RN staff for possible transfer to step down.  Jeri Modena 12/06/2019, 10:35 AM

## 2019-12-06 NOTE — Progress Notes (Signed)
ANTICOAGULATION CONSULT NOTE  Pharmacy Consult for Heparin Indication: atrial fibrillation  Allergies  Allergen Reactions   Diphenhydramine Hcl Palpitations    Patient Measurements: Height: 5\' 3"  (160 cm) Weight: 67.1 kg (147 lb 14.9 oz) IBW/kg (Calculated) : 56.9 Heparin Dosing Weight: 67.1 kg  Vital Signs: Temp: 98.5 F (36.9 C) (09/12 1702) Temp Source: Oral (09/12 1702) BP: 119/80 (09/12 1702) Pulse Rate: 76 (09/12 1702)  Labs: Recent Labs    12/03/19 2028 12/04/19 5329 12/04/19 0635 12/05/19 0420 12/06/19 0751 12/06/19 1809  HGB  --  10.8*   < > 10.5* 11.2*  --   HCT  --  33.3*  --  32.4* 34.4*  --   PLT  --  280  --  240 280  --   HEPARINUNFRC  --   --   --   --   --  0.23*  CREATININE  --  5.44*  --  4.92* 5.14*  --   TROPONINIHS 60*  --   --   --   --   --    < > = values in this interval not displayed.   Assessment:  59 yr old male to begin IV heparin for atrial fibrillation. Hx PAF but no anticoagulation PTA due to hx bleeding episodes from HD cath site while on Warfarin in the past.  Has been on SQ heparin for VTE prophylaxis.   Initial heparin level slightly subtherapeutic at 0.23.  Goal of Therapy:  Heparin level 0.3-0.7 units/ml Monitor platelets by anticoagulation protocol: Yes   Plan:  Increase heparin to 1150 units/h Recheck heparin level with am labs   Arrie Senate, PharmD, BCPS Clinical Pharmacist [phone] Please check AMION for all [facility] Pharmacy numbers 12/06/2019

## 2019-12-06 NOTE — Progress Notes (Signed)
   12/06/19 0837  Assess: MEWS Score  Temp 97.8 F (36.6 C)  BP 100/67  Pulse Rate (!) 130  Resp 16  SpO2 93 %  O2 Device Nasal Cannula  O2 Flow Rate (L/min) 5 L/min  Assess: MEWS Score  MEWS Temp 0  MEWS Systolic 1  MEWS Pulse 3  MEWS RR 0  MEWS LOC 0  MEWS Score 4  MEWS Score Color Red  Assess: if the MEWS score is Yellow or Red  Were vital signs taken at a resting state? Yes  Focused Assessment Change from prior assessment (see assessment flowsheet) (Converted to AFIB with RVR - HR as high as 150s)  Early Detection of Sepsis Score *See Row Information* High  MEWS guidelines implemented *See Row Information* Yes  Treat  MEWS Interventions Escalated (See documentation below)  Pain Scale 0-10  Pain Score 0  Take Vital Signs  Increase Vital Sign Frequency  Red: Q 1hr X 4 then Q 4hr X 4, if remains red, continue Q 4hrs  Escalate  MEWS: Escalate Red: discuss with charge nurse/RN and provider, consider discussing with RRT  Notify: Charge Nurse/RN  Name of Charge Nurse/RN Notified Maryruth Hancock RN  Date Charge Nurse/RN Notified 12/06/19  Time Charge Nurse/RN Notified [id number]  Notify: Provider  Provider Name/Title Dr. Tyrell Antonio  Date Provider Notified 12/06/19  Time Provider Notified [phone]  Notification Type Face-to-face  Notification Reason Change in status  Response See new orders  Date of Provider Response 12/06/19  Time of Provider Response [phone]  Document  Patient Outcome Transferred/level of care increased  Progress note created (see row info) Yes   CCMD notified this RN that patient was in AFIB with RVR - HR as high as 150s.  Upon assessment, patient is asymptomatic.  Dr. Tyrell Antonio on the floor and made aware.  Stat EKG obtained showing AFIB with RVR.  Orders received and implemented.  IV Metoprolol 2.5 mg x 2 given.  HR currently 92 AFIB.  RED MEWS implemented.  Patient to be transferred to Progressive Care Unit.  Will continue to monitor patient.  Earleen Reaper RN

## 2019-12-06 NOTE — Progress Notes (Signed)
Subjective:  Patient denies any chest pain or shortness of breath Brief episode of palpitation earlier this morning noted to be in A. fib with RVR received 2 doses of IV metoprolol 2.5 mg with spontaneous conversion to sinus rhythm states overall he feels well.  Chest x-ray likely.  Denies any chest pain.  Denies any palpitations.  Objective:  Vital Signs in the last 24 hours: Temp:  [97.4 F (36.3 C)-98.4 F (36.9 C)] 97.8 F (36.6 C) (09/12 1012) Pulse Rate:  [82-134] 90 (09/12 1012) Resp:  [16-39] 19 (09/12 1012) BP: (95-125)/(57-77) 102/76 (09/12 1012) SpO2:  [90 %-100 %] 96 % (09/12 1012) Weight:  [65.2 kg-67.1 kg] 67.1 kg (09/12 0526)  Intake/Output from previous day: 09/11 0701 - 09/12 0700 In: 980.4 [P.O.:580; IV Piggyback:400.4] Out: 2500  Intake/Output from this shift: Total I/O In: 240 [P.O.:240] Out: 0   Physical Exam: Neck: no adenopathy, no carotid bruit, no JVD, supple, symmetrical, trachea midline and thyroid not enlarged, symmetric, no tenderness/mass/nodules Lungs: Decreased breath sound at bases air entry has improved with occasional rhonchi and rails Heart: regular rate and rhythm, S1, S2 normal and 2/6 systolic murmur noted Abdomen: soft, non-tender; bowel sounds normal; no masses,  no organomegaly Extremities: extremities normal, atraumatic, no cyanosis or edema  Lab Results: Recent Labs    12/05/19 0420 12/06/19 0751  WBC 9.5 8.7  HGB 10.5* 11.2*  PLT 240 280   Recent Labs    12/05/19 0420 12/06/19 0751  NA 139 138  K 3.9 3.2*  CL 97* 98  CO2 25 25  GLUCOSE 99 140*  BUN 27* 32*  CREATININE 4.92* 5.14*   No results for input(s): TROPONINI in the last 72 hours.  Invalid input(s): CK, MB Hepatic Function Panel No results for input(s): PROT, ALBUMIN, AST, ALT, ALKPHOS, BILITOT, BILIDIR, IBILI in the last 72 hours. No results for input(s): CHOL in the last 72 hours. No results for input(s): PROTIME in the last 72 hours.  Imaging: Imaging  results have been reviewed and DG Chest 2 View  Result Date: 12/06/2019 CLINICAL DATA:  Follow-up CHF versus multifocal pneumonia. EXAM: CHEST - 2 VIEW COMPARISON:  12/03/2019 and earlier. FINDINGS: AP ERECT and LATERAL images were obtained. Cardiac silhouette moderately enlarged, unchanged. Thoracic aorta mildly tortuous and atherosclerotic, unchanged. Marked improvement in aeration throughout both lungs since the examination 3 days ago, though patchy ground-glass airspace opacities persist. A more focal nodular appearing opacity is present at the LEFT lung base, likely in the lingula. Small BILATERAL pleural effusions are unchanged. No new pulmonary parenchymal abnormalities. Degenerative changes throughout the thoracic spine and thoracic dextroscoliosis and lumbar levoscoliosis as noted previously. IMPRESSION: 1. Marked improvement in aeration throughout both lungs since the examination 3 days ago, though patchy ground-glass airspace opacities persist, indicating improving edema versus pneumonia. 2. Residual nodular opacity at the LEFT lung base, likely in the lingula. Follow-up chest x-ray to resolution of the acute CHF versus pneumonia is recommended to be sure that this opacity resolves. 3. Stable small BILATERAL pleural effusions. 4. No new abnormalities. Electronically Signed   By: Evangeline Dakin M.D.   On: 12/06/2019 09:16   ECHOCARDIOGRAM COMPLETE  Result Date: 12/04/2019    ECHOCARDIOGRAM REPORT   Patient Name:   Peter Sloan Date of Exam: 12/04/2019 Medical Rec #:  [id number]          Height:       63.0 in Accession #:    [id number]  Weight:       152.0 lb Date of Birth:  Aug 30, 1960          BSA:          1.721 m Patient Age:    59 years           BP:           117/94 mmHg Patient Gender: M                  HR:           90 bpm. Exam Location:  Inpatient Procedure: 2D Echo, Cardiac Doppler, Color Doppler and Intracardiac            Opacification Agent Indications:    CHF  History:         Patient has no prior history of Echocardiogram examinations. CHF                 and Cardiomyopathy, Arrythmias:Atrial Fibrillation,                 Signs/Symptoms:Shortness of Breath; Risk Factors:Dyslipidemia.                 ESRD, kidney transplant.  Sonographer:    Dustin Flock RDCS Referring Phys: [place]  1. Left ventricular ejection fraction, by estimation, is 20 to 25%. The left ventricle has severely decreased function. The left ventricle demonstrates global hypokinesis. The left ventricular internal cavity size was mildly dilated. Left ventricular diastolic parameters were normal.  2. Right ventricular systolic function is moderately reduced. The right ventricular size is normal. There is mildly elevated pulmonary artery systolic pressure.  3. Left atrial size was severely dilated.  4. Right atrial size was moderately dilated.  5. The mitral valve is degenerative. Severe mitral valve regurgitation. No evidence of mitral stenosis.  6. The aortic valve is tricuspid. Aortic valve regurgitation is not visualized.  7. The inferior vena cava is dilated in size with <50% respiratory variability, suggesting right atrial pressure of 15 mmHg. FINDINGS  Left Ventricle: Left ventricular ejection fraction, by estimation, is 20 to 25%. The left ventricle has severely decreased function. The left ventricle demonstrates global hypokinesis. Definity contrast agent was given IV to delineate the left ventricular endocardial borders. The left ventricular internal cavity size was mildly dilated. There is no left ventricular hypertrophy. Left ventricular diastolic parameters were normal. Right Ventricle: The right ventricular size is normal. No increase in right ventricular wall thickness. Right ventricular systolic function is moderately reduced. There is mildly elevated pulmonary artery systolic pressure. The tricuspid regurgitant velocity is 2.70 m/s, and with an assumed right atrial pressure of  15 mmHg, the estimated right ventricular systolic pressure is 03.4 mmHg. Left Atrium: Left atrial size was severely dilated. Right Atrium: Right atrial size was moderately dilated. Pericardium: There is no evidence of pericardial effusion. Mitral Valve: The mitral valve is degenerative in appearance. There is mild thickening of the mitral valve leaflet(s). There is mild calcification of the mitral valve leaflet(s). Mild mitral annular calcification. Severe mitral valve regurgitation. No evidence of mitral valve stenosis. Tricuspid Valve: The tricuspid valve is normal in structure. Tricuspid valve regurgitation is mild. Aortic Valve: The aortic valve is tricuspid. Aortic valve regurgitation is not visualized. Pulmonic Valve: The pulmonic valve was normal in structure. Pulmonic valve regurgitation is trivial. Aorta: The aortic root is normal in size and structure. There is minimal (Grade I) atheroma plaque involving the aortic root and ascending aorta. Venous:  The inferior vena cava is dilated in size with less than 50% respiratory variability, suggesting right atrial pressure of 15 mmHg. IAS/Shunts: The interatrial septum was not assessed.  LEFT VENTRICLE PLAX 2D LVIDd:         5.80 cm  Diastology LVIDs:         5.50 cm  LV e' medial:    5.22 cm/s LV PW:         1.00 cm  LV E/e' medial:  17.1 LV IVS:        1.00 cm  LV e' lateral:   4.79 cm/s LVOT diam:     2.00 cm  LV E/e' lateral: 18.6 LV SV:         45 LV SV Index:   26 LVOT Area:     3.14 cm  RIGHT VENTRICLE RV Basal diam:  4.30 cm RV S prime:     9.25 cm/s TAPSE (M-mode): 2.4 cm LEFT ATRIUM              Index       RIGHT ATRIUM           Index LA diam:        4.60 cm  2.67 cm/m  RA Area:     18.70 cm LA Vol (A2C):   105.0 ml 61.02 ml/m RA Volume:   52.80 ml  30.68 ml/m LA Vol (A4C):   61.7 ml  35.86 ml/m LA Biplane Vol: 81.2 ml  47.19 ml/m  AORTIC VALVE LVOT Vmax:   93.80 cm/s LVOT Vmean:  53.400 cm/s LVOT VTI:    0.142 m  AORTA Ao Root diam: 3.10 cm  MITRAL VALVE               TRICUSPID VALVE MV Area (PHT): 5.13 cm    TR Peak grad:   29.2 mmHg MV Decel Time: 148 msec    TR Vmax:        270.00 cm/s MV E velocity: 89.20 cm/s MV A velocity: 70.80 cm/s  SHUNTS MV E/A ratio:  1.26        Systemic VTI:  0.14 m                            Systemic Diam: 2.00 cm Dixie Dials MD Electronically signed by Dixie Dials MD Signature Date/Time: 12/04/2019/6:02:40 PM    Final     Cardiac Studies:  Assessment/Plan:   Resolving acute systolic congestive heart failure. Bilateral multifocal pneumonia. 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  paroxysmal A. Fib.  Converted back to sinus rhythm End-stage renal disease on hemodialysis. 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 Anemia of chronic disease. Seizure disorder. Plan Start heparin per orders Start low-dose Lopressor as per orders EKG daily Dr. Doylene Canard to follow from a.m.  LOS: 2 days    Peter Sloan 12/06/2019, 10:46 AM

## 2019-12-06 NOTE — Progress Notes (Addendum)
PROGRESS NOTE    Peter Sloan  GEX:[id number] DOB: 1960-10-16 DOA: 12/03/2019 PCP: Mauricia Area, MD   Brief Narrative: 59 year old with past medical history significant for ESRD on hemodialysis, cardiomyopathy with ejection fraction 30 to 35%, seizure disorder, paroxysmal atrial fibrillation not on anticoagulation, presents complaining of worsening shortness of breath and cough for the last 1 or 2 weeks.  He report productive cough.  Evaluation in the ED patient was found to be afebrile, hypoxic 85 on room air, tachypneic, chest x-ray with widespread bilateral groundglass airspace disease suggestive of multifocal pneumonia or edema.  Elevated BNP, mild elevation of troponin.  Covid PCR negative.  Patient develops A. Fib RVR 9/12. Started on heparin Gtt.   Assessment & Plan:   Principal Problem:   Acute respiratory failure with hypoxia (HCC) Active Problems:   Hypokalemia   Seizure disorder (HCC)   End stage renal disease on dialysis [facility])   Atrial fibrillation (HCC)  1-Acute Hypoxic Respiratory Failure secondary to Acute systolic heart failure exacerbation and or pneumonia: -Patient presented with hypoxemia, dyspnea, elevated BNP 4000, chest x-ray asymmetric edema versus pneumonia. Prior EF 35 %. -Nephrology consulted. Had HD 9/10 and 9/11. -Received two doses of Azithro, due to prolong QT will change azithromycin to Doxy.  -Oxygen sat 90 % on 5 L. Will increase to 6 L.  -Sputum culture; nee to be collected again.  -Pro-calcitonin increased to 3, Change ceftriaxone to cefepime to cover for gram negative rods.  -Continue with  Guaifenesin.  -Flutter valve.  -Chest x ray pending.   2-Acute systolic heart failure exacerbation, paroxysmal A. fib: Cardiology consulted Echo with lower EF at 20, Severe mitral regurgitation.  Cardiology recommending cath in or out patient. Awaiting improvement of PNA>   3-Hypokalemia: replete orally 40 meq times one.   4-ESRD: He had  hemodialysis on 9/9 and 9/11 -Chest x-ray with pulmonary edema versus pneumonia.  BNP elevated.    5-Paroxysmal A. Fib: Cardiology consulted. A fib RVR this am. Hold amiodarone until cardiology follow on patient.  Patient denies dyspnea, chest pain.  He received metoprolol last night.  EKG showed a fib RVR. Low dose IV metoprolol order. Discussed with patient option for heparin to prevent stroke. Discussed with cardiology, agree that we should start heparin gtt. Transfer to progressive care unit for closer monitoring.   6-Mild elevation of troponin: Related to demand ischemia in the setting of heart failure exacerbation. Cardiology following.  7-Seizure disorder: Continue with Keppra and Depakote  8-Metabolic Bone diseases. Check phosphorus level. Discussed with Dr Jonnie Finner, follow phosphorus leve and they will decide on resumption of Auryxia.   9-Constipation; Sorbitol PRN ordered.  10-Nutrition; nutrition consulted.   Estimated body mass index is 26.2 kg/m as calculated from the following:   Height as of this encounter: 5\' 3"  (1.6 m).   Weight as of this encounter: 67.1 kg.   DVT prophylaxis: Heparin Code Status: Full code Family Communication: Care discussed with patient and sister Disposition Plan:  Status is: Inpatient  Remains inpatient appropriate because:IV treatments appropriate due to intensity of illness or inability to take PO   Dispo: The patient is from: Home              Anticipated d/c is to: Home              Anticipated d/c date is: 3 days              Patient currently is not medically stable to d/c.  Consultants:   Nephrology  Cardiology  Procedures:   HD  ECHO;  Antimicrobials:  Ceftriaxone 9/10 Azithromycin 9/10.  Subjective: He denies worsening dyspnea, or chest pain.  He took his metoprolol last night  Objective: Vitals:   12/05/19 2120 12/06/19 0526 12/06/19 0700 12/06/19 0837  BP: 95/70 104/72  100/67  Pulse: 87 82   (!) 130  Resp: 18 18  16   Temp: 97.9 F (36.6 C) 97.9 F (36.6 C)  97.8 F (36.6 C)  TempSrc: Oral Oral  Oral  SpO2: 90% 98% 92% 93%  Weight:  67.1 kg    Height:        Intake/Output Summary (Last 24 hours) at 12/06/2019 0854 Last data filed at 12/06/2019 0527 Gross per 24 hour  Intake 980.43 ml  Output 2500 ml  Net -1519.57 ml   Filed Weights   12/05/19 0645 12/05/19 1055 12/06/19 0526  Weight: 67.6 kg 65.2 kg 67.1 kg    Examination:  General exam: NAD Respiratory system: Normal respiratory effort, no significant crackles.  Cardiovascular system: S 1, S 2 RRR Gastrointestinal system: BS present, soft, nt Central nervous system: Alert, and oriented Extremities: Symmetric power.  Skin: No rashes   Data Reviewed: I have personally reviewed following labs and imaging studies  CBC: Recent Labs  Lab 12/03/19 1656 12/04/19 0635 12/05/19 0420 12/06/19 0751  WBC 10.9* 10.5 9.5 8.7  HGB 11.3* 10.8* 10.5* 11.2*  HCT 33.9* 33.3* 32.4* 34.4*  MCV 100.9* 102.1* 101.6* 100.3*  PLT 308 280 240 157   Basic Metabolic Panel: Recent Labs  Lab 12/03/19 1656 12/04/19 0635 12/05/19 0420 12/05/19 1305 12/06/19 0751  NA 137 137 139  --  138  K 3.2* 4.4 3.9  --  3.2*  CL 93* 93* 97*  --  98  CO2 31 29 25   --  25  GLUCOSE 117* 113* 99  --  140*  BUN 12 23* 27*  --  32*  CREATININE 3.84* 5.44* 4.92*  --  5.14*  CALCIUM 8.7* 9.0 9.4  --  10.0  MG  --  2.1  --   --   --   PHOS  --   --   --  5.7*  --    GFR: Estimated Creatinine Clearance: 12.8 mL/min (A) (by C-G formula based on SCr of 5.14 mg/dL (H)). Liver Function Tests: No results for input(s): AST, ALT, ALKPHOS, BILITOT, PROT, ALBUMIN in the last 168 hours. No results for input(s): LIPASE, AMYLASE in the last 168 hours. No results for input(s): AMMONIA in the last 168 hours. Coagulation Profile: No results for input(s): INR, PROTIME in the last 168 hours. Cardiac Enzymes: No results for input(s): CKTOTAL, CKMB,  CKMBINDEX, TROPONINI in the last 168 hours. BNP (last 3 results) No results for input(s): PROBNP in the last 8760 hours. HbA1C: No results for input(s): HGBA1C in the last 72 hours. CBG: No results for input(s): GLUCAP in the last 168 hours. Lipid Profile: No results for input(s): CHOL, HDL, LDLCALC, TRIG, CHOLHDL, LDLDIRECT in the last 72 hours. Thyroid Function Tests: No results for input(s): TSH, T4TOTAL, FREET4, T3FREE, THYROIDAB in the last 72 hours. Anemia Panel: No results for input(s): VITAMINB12, FOLATE, FERRITIN, TIBC, IRON, RETICCTPCT in the last 72 hours. Sepsis Labs: Recent Labs  Lab 12/04/19 0635 12/05/19 0420  PROCALCITON 1.88 3.20    Recent Results (from the past 240 hour(s))  SARS Coronavirus 2 by RT PCR (hospital order, performed in [facility] hospital lab) Nasopharyngeal Nasopharyngeal Swab  Status: None   Collection Time: 12/03/19  4:54 PM   Specimen: Nasopharyngeal Swab  Result Value Ref Range Status   SARS Coronavirus 2 NEGATIVE NEGATIVE Final    Comment: (NOTE) SARS-CoV-2 target nucleic acids are NOT DETECTED.  The SARS-CoV-2 RNA is generally detectable in upper and lower respiratory specimens during the acute phase of infection. The lowest concentration of SARS-CoV-2 viral copies this assay can detect is 250 copies / mL. A negative result does not preclude SARS-CoV-2 infection and should not be used as the sole basis for treatment or other patient management decisions.  A negative result may occur with improper specimen collection / handling, submission of specimen other than nasopharyngeal swab, presence of viral mutation(s) within the areas targeted by this assay, and inadequate number of viral copies (<250 copies / mL). A negative result must be combined with clinical observations, patient history, and epidemiological information.  Fact Sheet for Patients:   StrictlyIdeas.no  Fact Sheet for Healthcare  Providers: BankingDealers.co.za  This test is not yet approved or  cleared by the [place] FDA and has been authorized for detection and/or diagnosis of SARS-CoV-2 by FDA under an Emergency Use Authorization (EUA).  This EUA will remain in effect (meaning this test can be used) for the duration of the COVID-19 declaration under Section 564(b)(1) of the Act, 21 U.S.C. section 360bbb-3(b)(1), unless the authorization is terminated or revoked sooner.  Performed at [facility], [place] [address], [place], [place] [address]   Culture, sputum-assessment     Status: None   Collection Time: 12/04/19 12:10 PM   Specimen: Expectorated Sputum  Result Value Ref Range Status   Specimen Description EXPECTORATED SPUTUM  Final   Special Requests NONE  Final   Sputum evaluation   Final    Sputum specimen not acceptable for testing.  Please recollect.   J,FOKUO RN @[phone]  12/04/19 EB Performed at [place] [address] [place], [place] [address]    Report Status 12/04/2019 FINAL  Final  MRSA PCR Screening     Status: None   Collection Time: 12/04/19 11:43 PM   Specimen: Nasopharyngeal  Result Value Ref Range Status   MRSA by PCR NEGATIVE NEGATIVE Final    Comment:        The GeneXpert MRSA Assay (FDA approved for NASAL specimens only), is one component of a comprehensive MRSA colonization surveillance program. It is not intended to diagnose MRSA infection nor to guide or monitor treatment for MRSA infections. Performed at [facility], [place] [address], [place], [place] [address]          Radiology Studies: ECHOCARDIOGRAM COMPLETE  Result Date: 12/04/2019    ECHOCARDIOGRAM REPORT   Patient Name:   BRYSTON COLOCHO [facility] Date of Exam: 12/04/2019 Medical Rec #:  [id number]          Height:       63.0 in Accession #:    [id number]         Weight:       152.0 lb Date of Birth:  12/27/1960          BSA:          1.721 m Patient Age:    32 years            BP:           117/94 mmHg Patient Gender: M  HR:           90 bpm. Exam Location:  Inpatient Procedure: 2D Echo, Cardiac Doppler, Color Doppler and Intracardiac            Opacification Agent Indications:    CHF  History:        Patient has no prior history of Echocardiogram examinations. CHF                 and Cardiomyopathy, Arrythmias:Atrial Fibrillation,                 Signs/Symptoms:Shortness of Breath; Risk Factors:Dyslipidemia.                 ESRD, kidney transplant.  Sonographer:    Dustin Flock RDCS Referring Phys: [place]  1. Left ventricular ejection fraction, by estimation, is 20 to 25%. The left ventricle has severely decreased function. The left ventricle demonstrates global hypokinesis. The left ventricular internal cavity size was mildly dilated. Left ventricular diastolic parameters were normal.  2. Right ventricular systolic function is moderately reduced. The right ventricular size is normal. There is mildly elevated pulmonary artery systolic pressure.  3. Left atrial size was severely dilated.  4. Right atrial size was moderately dilated.  5. The mitral valve is degenerative. Severe mitral valve regurgitation. No evidence of mitral stenosis.  6. The aortic valve is tricuspid. Aortic valve regurgitation is not visualized.  7. The inferior vena cava is dilated in size with <50% respiratory variability, suggesting right atrial pressure of 15 mmHg. FINDINGS  Left Ventricle: Left ventricular ejection fraction, by estimation, is 20 to 25%. The left ventricle has severely decreased function. The left ventricle demonstrates global hypokinesis. Definity contrast agent was given IV to delineate the left ventricular endocardial borders. The left ventricular internal cavity size was mildly dilated. There is no left ventricular hypertrophy. Left ventricular diastolic parameters were normal. Right Ventricle: The right ventricular size is normal. No increase in  right ventricular wall thickness. Right ventricular systolic function is moderately reduced. There is mildly elevated pulmonary artery systolic pressure. The tricuspid regurgitant velocity is 2.70 m/s, and with an assumed right atrial pressure of 15 mmHg, the estimated right ventricular systolic pressure is 81.1 mmHg. Left Atrium: Left atrial size was severely dilated. Right Atrium: Right atrial size was moderately dilated. Pericardium: There is no evidence of pericardial effusion. Mitral Valve: The mitral valve is degenerative in appearance. There is mild thickening of the mitral valve leaflet(s). There is mild calcification of the mitral valve leaflet(s). Mild mitral annular calcification. Severe mitral valve regurgitation. No evidence of mitral valve stenosis. Tricuspid Valve: The tricuspid valve is normal in structure. Tricuspid valve regurgitation is mild. Aortic Valve: The aortic valve is tricuspid. Aortic valve regurgitation is not visualized. Pulmonic Valve: The pulmonic valve was normal in structure. Pulmonic valve regurgitation is trivial. Aorta: The aortic root is normal in size and structure. There is minimal (Grade I) atheroma plaque involving the aortic root and ascending aorta. Venous: The inferior vena cava is dilated in size with less than 50% respiratory variability, suggesting right atrial pressure of 15 mmHg. IAS/Shunts: The interatrial septum was not assessed.  LEFT VENTRICLE PLAX 2D LVIDd:         5.80 cm  Diastology LVIDs:         5.50 cm  LV e' medial:    5.22 cm/s LV PW:         1.00 cm  LV E/e' medial:  17.1 LV IVS:  1.00 cm  LV e' lateral:   4.79 cm/s LVOT diam:     2.00 cm  LV E/e' lateral: 18.6 LV SV:         45 LV SV Index:   26 LVOT Area:     3.14 cm  RIGHT VENTRICLE RV Basal diam:  4.30 cm RV S prime:     9.25 cm/s TAPSE (M-mode): 2.4 cm LEFT ATRIUM              Index       RIGHT ATRIUM           Index LA diam:        4.60 cm  2.67 cm/m  RA Area:     18.70 cm LA Vol (A2C):    105.0 ml 61.02 ml/m RA Volume:   52.80 ml  30.68 ml/m LA Vol (A4C):   61.7 ml  35.86 ml/m LA Biplane Vol: 81.2 ml  47.19 ml/m  AORTIC VALVE LVOT Vmax:   93.80 cm/s LVOT Vmean:  53.400 cm/s LVOT VTI:    0.142 m  AORTA Ao Root diam: 3.10 cm MITRAL VALVE               TRICUSPID VALVE MV Area (PHT): 5.13 cm    TR Peak grad:   29.2 mmHg MV Decel Time: 148 msec    TR Vmax:        270.00 cm/s MV E velocity: 89.20 cm/s MV A velocity: 70.80 cm/s  SHUNTS MV E/A ratio:  1.26        Systemic VTI:  0.14 m                            Systemic Diam: 2.00 cm Dixie Dials MD Electronically signed by Dixie Dials MD Signature Date/Time: 12/04/2019/6:02:40 PM    Final         Scheduled Meds: . allopurinol  300 mg Oral Daily  . amiodarone  200 mg Oral Daily  . aspirin EC  81 mg Oral Daily  . calcitRIOL  1 mcg Oral Q T,Th,Sa-HD  . Chlorhexidine Gluconate Cloth  6 each Topical Q0600  . cinacalcet  180 mg Oral Q T,Th,Sa-HD  . digoxin  0.25 mg Oral Daily  . divalproex  250 mg Oral QHS  . guaiFENesin  600 mg Oral BID  . heparin  5,000 Units Subcutaneous Q8H  . lamoTRIgine  25 mg Oral BID  . levETIRAcetam  500 mg Oral BID  . metoprolol tartrate  25 mg Oral QPM  . potassium chloride  40 mEq Oral Once  . sodium chloride flush  3 mL Intravenous Q12H  . sodium chloride flush  3 mL Intravenous Q12H   Continuous Infusions: . sodium chloride    . azithromycin 500 mg (12/05/19 2140)  . ceFEPime (MAXIPIME) IV 1 g (12/05/19 0953)     LOS: 2 days    Time spent: 35 minutes.     Elmarie Shiley, MD [facility]   If 7PM-7AM, please contact night-coverage [email or web address]  12/06/2019, 8:54 AM

## 2019-12-06 NOTE — Progress Notes (Signed)
ANTICOAGULATION CONSULT NOTE - Initial Consult  Pharmacy Consult for Heparin Indication: atrial fibrillation  Allergies  Allergen Reactions  . Diphenhydramine Hcl Palpitations    Patient Measurements: Height: 5\' 3"  (160 cm) Weight: 67.1 kg (147 lb 14.9 oz) IBW/kg (Calculated) : 56.9 Heparin Dosing Weight: 67.1 kg  Vital Signs: Temp: 97.4 F (36.3 C) (09/12 0900) Temp Source: Oral (09/12 0900) BP: 106/77 (09/12 0900) Pulse Rate: 134 (09/12 0900)  Labs: Recent Labs    12/03/19 1656 12/03/19 1656 12/03/19 2028 12/04/19 0635 12/04/19 0635 12/05/19 0420 12/06/19 0751  HGB 11.3*   < >  --  10.8*   < > 10.5* 11.2*  HCT 33.9*   < >  --  33.3*  --  32.4* 34.4*  PLT 308   < >  --  280  --  240 280  CREATININE 3.84*   < >  --  5.44*  --  4.92* 5.14*  TROPONINIHS 65*  --  60*  --   --   --   --    < > = values in this interval not displayed.   Assessment:  59 yr old male to begin IV heparin for atrial fibrillation. Hx PAF but no anticoagulation PTA due to hx bleeding episodes from HD cath site while on Warfarin in the past.  Has been on SQ heparin for VTE prophylaxis. Last dose ~6am today.    Pharmacy also assisting with Cefepime dosing for pneumonia.  Azithromycin changed to Doxycycline IV this morning due to prolonged QTc.  Goal of Therapy:  Heparin level 0.3-0.7 units/ml Monitor platelets by anticoagulation protocol: Yes   Plan:   Discontinue SQ heparin.  No IV heparin bolus with recent SQ dose given.  Heparin drip to begin at 950 units/hr  Heparin level ~8 hrs after drip begins.  Daily heparin level and CBC while on heparin.  Monitor for signs/symptoms of bleeding.  Arty Baumgartner, [place] Phone: [phone] 12/06/2019,9:35 AM

## 2019-12-06 NOTE — Progress Notes (Addendum)
Peter Sloan Progress Note   Subjective:   Patient seen in room. Reports he had an episode of a.fib overnight. Currently RRR. K+ 3.2- already given KCl 42mEq PO. Patient does not feel SOB at present and reports no SOB when he walked to he bathroom yesterday, however still on O2 6L with O2 sat in the low 90's. Denies CP, palpitations, dizziness, abdominal pain, N/V/D.   Objective Vitals:   12/06/19 0700 12/06/19 0837 12/06/19 0900 12/06/19 1012  BP:  100/67 106/77 102/76  Pulse:  (!) 130 (!) 134 90  Resp:  16 (!) 26 19  Temp:  97.8 F (36.6 C) (!) 97.4 F (36.3 C) 97.8 F (36.6 C)  TempSrc:  Oral Oral Oral  SpO2: 92% 93% 97% 96%  Weight:      Height:       Physical Exam General: Well developed male, alert, in NAD Heart: RRR, no murmurs, rubs or gallops Lungs: Lungs CTA without wheezing, rhonchi or rales Abdomen: Soft, non-tender, non-distended, +BS Extremities: No edema b/l lower extremities Dialysis Access:  LUE AVF + bruit  Additional Objective Labs: Basic Metabolic Panel: Recent Labs  Lab 12/04/19 0635 12/05/19 0420 12/05/19 1305 12/06/19 0751  NA 137 139  --  138  K 4.4 3.9  --  3.2*  CL 93* 97*  --  98  CO2 29 25  --  25  GLUCOSE 113* 99  --  140*  BUN 23* 27*  --  32*  CREATININE 5.44* 4.92*  --  5.14*  CALCIUM 9.0 9.4  --  10.0  PHOS  --   --  5.7*  --    Liver Function Tests: No results for input(s): AST, ALT, ALKPHOS, BILITOT, PROT, ALBUMIN in the last 168 hours. No results for input(s): LIPASE, AMYLASE in the last 168 hours. CBC: Recent Labs  Lab 12/03/19 1656 12/03/19 1656 12/04/19 0635 12/05/19 0420 12/06/19 0751  WBC 10.9*   < > 10.5 9.5 8.7  HGB 11.3*   < > 10.8* 10.5* 11.2*  HCT 33.9*   < > 33.3* 32.4* 34.4*  MCV 100.9*  --  102.1* 101.6* 100.3*  PLT 308   < > 280 240 280   < > = values in this interval not displayed.   Blood Culture    Component Value Date/Time   SDES EXPECTORATED SPUTUM 12/04/2019 1210   SPECREQUEST  NONE 12/04/2019 1210   REPTSTATUS 12/04/2019 FINAL 12/04/2019 1210    Studies/Results: DG Chest 2 View  Result Date: 12/06/2019 CLINICAL DATA:  Follow-up CHF versus multifocal pneumonia. EXAM: CHEST - 2 VIEW COMPARISON:  12/03/2019 and earlier. FINDINGS: AP ERECT and LATERAL images were obtained. Cardiac silhouette moderately enlarged, unchanged. Thoracic aorta mildly tortuous and atherosclerotic, unchanged. Marked improvement in aeration throughout both lungs since the examination 3 days ago, though patchy ground-glass airspace opacities persist. A more focal nodular appearing opacity is present at the LEFT lung base, likely in the lingula. Small BILATERAL pleural effusions are unchanged. No new pulmonary parenchymal abnormalities. Degenerative changes throughout the thoracic spine and thoracic dextroscoliosis and lumbar levoscoliosis as noted previously. IMPRESSION: 1. Marked improvement in aeration throughout both lungs since the examination 3 days ago, though patchy ground-glass airspace opacities persist, indicating improving edema versus pneumonia. 2. Residual nodular opacity at the LEFT lung base, likely in the lingula. Follow-up chest x-ray to resolution of the acute CHF versus pneumonia is recommended to be sure that this opacity resolves. 3. Stable small BILATERAL pleural effusions. 4. No new abnormalities.  Electronically Signed   By: Evangeline Dakin M.D.   On: 12/06/2019 09:16   ECHOCARDIOGRAM COMPLETE  Result Date: 12/04/2019    ECHOCARDIOGRAM REPORT   Patient Name:   Peter Sloan [facility] Date of Exam: 12/04/2019 Medical Rec #:  [id number]          Height:       63.0 in Accession #:    [id number]         Weight:       152.0 lb Date of Birth:  06/26/1960          BSA:          1.721 m Patient Age:    59 years           BP:           117/94 mmHg Patient Gender: M                  HR:           90 bpm. Exam Location:  Inpatient Procedure: 2D Echo, Cardiac Doppler, Color Doppler and Intracardiac             Opacification Agent Indications:    CHF  History:        Patient has no prior history of Echocardiogram examinations. CHF                 and Cardiomyopathy, Arrythmias:Atrial Fibrillation,                 Signs/Symptoms:Shortness of Breath; Risk Factors:Dyslipidemia.                 ESRD, kidney transplant.  Sonographer:    Dustin Flock RDCS Referring Phys: [place]  1. Left ventricular ejection fraction, by estimation, is 20 to 25%. The left ventricle has severely decreased function. The left ventricle demonstrates global hypokinesis. The left ventricular internal cavity size was mildly dilated. Left ventricular diastolic parameters were normal.  2. Right ventricular systolic function is moderately reduced. The right ventricular size is normal. There is mildly elevated pulmonary artery systolic pressure.  3. Left atrial size was severely dilated.  4. Right atrial size was moderately dilated.  5. The mitral valve is degenerative. Severe mitral valve regurgitation. No evidence of mitral stenosis.  6. The aortic valve is tricuspid. Aortic valve regurgitation is not visualized.  7. The inferior vena cava is dilated in size with <50% respiratory variability, suggesting right atrial pressure of 15 mmHg. FINDINGS  Left Ventricle: Left ventricular ejection fraction, by estimation, is 20 to 25%. The left ventricle has severely decreased function. The left ventricle demonstrates global hypokinesis. Definity contrast agent was given IV to delineate the left ventricular endocardial borders. The left ventricular internal cavity size was mildly dilated. There is no left ventricular hypertrophy. Left ventricular diastolic parameters were normal. Right Ventricle: The right ventricular size is normal. No increase in right ventricular wall thickness. Right ventricular systolic function is moderately reduced. There is mildly elevated pulmonary artery systolic pressure. The tricuspid regurgitant  velocity is 2.70 m/s, and with an assumed right atrial pressure of 15 mmHg, the estimated right ventricular systolic pressure is 35.0 mmHg. Left Atrium: Left atrial size was severely dilated. Right Atrium: Right atrial size was moderately dilated. Pericardium: There is no evidence of pericardial effusion. Mitral Valve: The mitral valve is degenerative in appearance. There is mild thickening of the mitral valve leaflet(s). There is mild calcification of the mitral valve leaflet(s). Mild mitral annular  calcification. Severe mitral valve regurgitation. No evidence of mitral valve stenosis. Tricuspid Valve: The tricuspid valve is normal in structure. Tricuspid valve regurgitation is mild. Aortic Valve: The aortic valve is tricuspid. Aortic valve regurgitation is not visualized. Pulmonic Valve: The pulmonic valve was normal in structure. Pulmonic valve regurgitation is trivial. Aorta: The aortic root is normal in size and structure. There is minimal (Grade I) atheroma plaque involving the aortic root and ascending aorta. Venous: The inferior vena cava is dilated in size with less than 50% respiratory variability, suggesting right atrial pressure of 15 mmHg. IAS/Shunts: The interatrial septum was not assessed.  LEFT VENTRICLE PLAX 2D LVIDd:         5.80 cm  Diastology LVIDs:         5.50 cm  LV e' medial:    5.22 cm/s LV PW:         1.00 cm  LV E/e' medial:  17.1 LV IVS:        1.00 cm  LV e' lateral:   4.79 cm/s LVOT diam:     2.00 cm  LV E/e' lateral: 18.6 LV SV:         45 LV SV Index:   26 LVOT Area:     3.14 cm  RIGHT VENTRICLE RV Basal diam:  4.30 cm RV S prime:     9.25 cm/s TAPSE (M-mode): 2.4 cm LEFT ATRIUM              Index       RIGHT ATRIUM           Index LA diam:        4.60 cm  2.67 cm/m  RA Area:     18.70 cm LA Vol (A2C):   105.0 ml 61.02 ml/m RA Volume:   52.80 ml  30.68 ml/m LA Vol (A4C):   61.7 ml  35.86 ml/m LA Biplane Vol: 81.2 ml  47.19 ml/m  AORTIC VALVE LVOT Vmax:   93.80 cm/s LVOT Vmean:   53.400 cm/s LVOT VTI:    0.142 m  AORTA Ao Root diam: 3.10 cm MITRAL VALVE               TRICUSPID VALVE MV Area (PHT): 5.13 cm    TR Peak grad:   29.2 mmHg MV Decel Time: 148 msec    TR Vmax:        270.00 cm/s MV E velocity: 89.20 cm/s MV A velocity: 70.80 cm/s  SHUNTS MV E/A ratio:  1.26        Systemic VTI:  0.14 m                            Systemic Diam: 2.00 cm Dixie Dials MD Electronically signed by Dixie Dials MD Signature Date/Time: 12/04/2019/6:02:40 PM    Final    Medications:  sodium chloride     ceFEPime (MAXIPIME) IV 1 g (12/05/19 0953)   doxycycline (VIBRAMYCIN) IV     heparin      allopurinol  300 mg Oral Daily   amiodarone  200 mg Oral Daily   aspirin EC  81 mg Oral Daily   calcitRIOL  1 mcg Oral Q T,Th,Sa-HD   Chlorhexidine Gluconate Cloth  6 each Topical Q0600   cinacalcet  180 mg Oral Q T,Th,Sa-HD   divalproex  250 mg Oral QHS   guaiFENesin  600 mg Oral BID   lamoTRIgine  25 mg Oral BID  levETIRAcetam  500 mg Oral BID   metoprolol tartrate  25 mg Oral QPM   sodium chloride flush  3 mL Intravenous Q12H   sodium chloride flush  3 mL Intravenous Q12H    Dialysis Orders: [facility] T,Th,S 4 hours 180 NRe 400/800 69.5 kg 2.0 K/ 2.0 Ca AVG -Heparin 6800 units IV TIW -Sensipar 180 mg PO TIW -Calcitriol 1.0 mcg PO TIW (last PTH 744 11/05/2019)  Assessment/Plan: 1. Pulmonary edema/Volume overload: Has been leaving under EDW at OP center, H/O HFrEF. Serial HD Friday/Saturday with 2/5L off each treatment, has lost body wt. Also has lower EF than usual (30% > 20%) and cardiology is evaluating. CXR with improved aeration and opacity L lung, CHF vs. Pneumonia. On abx. Still requiring supplemental O2. Will plan HD again tomorrow and likely Tuesday as well. Severe bilat infiltrates have resolved about 75-80% after HD x2, suggests fluid overload as cause.  2. A. Fib- converted to a.fib overnight, back in sinus rhythm at present. Per primary/cardiology. On heparin  drip. Keep potassium >4, using high K+ bath with HD.  3. Possible PNA - ABX per primary- doxycycline and cefepime.  4. ESRD - usual HD TTS.  HD Friday and Saturday, plan for HD again tomorrow.  5. Hypertension/volume - As noted above. No antihypertensive medson OP med list. BP is controlled. Pt reports max tolerable UF is 2.5L.  6. Anemia - HGB 11.3 No ESA needed. 7. Metabolic bone disease -Chronic issues with hyperphosphatemia as OP. Calcium 10, no alb reported. Hold calcitriol, continue sensipar.  Phos close to goal- 5.7.  8. Nutrition - Renal diet with fld restrictions.  8. H/O Afib. Not on anticoagulation. On amiodarone, metoprolol.  9. H/O HFrEF. Repeat ECHO EF down 20-25% (was 30-35%). Dr. Audria Nine, PA-C 12/06/2019, 10:23 AM  [facility] Pager: [phone]  Pt seen, examined and agree w assess/plan as above with additions as indicated.  [facility] 12/06/2019, 11:47 AM

## 2019-12-06 NOTE — Plan of Care (Signed)
  Problem: Education: Goal: Knowledge of General Education information will improve Description Including pain rating scale, medication(s)/side effects and non-pharmacologic comfort measures Outcome: Progressing   

## 2019-12-07 LAB — CBC
HCT: 31.4 % — ABNORMAL LOW (ref 39.0–52.0)
Hemoglobin: 10.4 g/dL — ABNORMAL LOW (ref 13.0–17.0)
MCH: 33.7 pg (ref 26.0–34.0)
MCHC: 33.1 g/dL (ref 30.0–36.0)
MCV: 101.6 fL — ABNORMAL HIGH (ref 80.0–100.0)
Platelets: 264 10*3/uL (ref 150–400)
RBC: 3.09 MIL/uL — ABNORMAL LOW (ref 4.22–5.81)
RDW: 13.8 % (ref 11.5–15.5)
WBC: 8 10*3/uL (ref 4.0–10.5)
nRBC: 0 % (ref 0.0–0.2)

## 2019-12-07 LAB — BASIC METABOLIC PANEL
Anion gap: 15 (ref 5–15)
BUN: 52 mg/dL — ABNORMAL HIGH (ref 6–20)
CO2: 23 mmol/L (ref 22–32)
Calcium: 10.5 mg/dL — ABNORMAL HIGH (ref 8.9–10.3)
Chloride: 101 mmol/L (ref 98–111)
Creatinine, Ser: 7 mg/dL — ABNORMAL HIGH (ref 0.61–1.24)
GFR calc Af Amer: 9 mL/min — ABNORMAL LOW (ref >60)
GFR calc non Af Amer: 8 mL/min — ABNORMAL LOW (ref >60)
Glucose, Bld: 93 mg/dL (ref 70–99)
Potassium: 3.9 mmol/L (ref 3.5–5.1)
Sodium: 139 mmol/L (ref 135–145)

## 2019-12-07 LAB — PROCALCITONIN: Procalcitonin: 2.97 ng/mL

## 2019-12-07 LAB — HEPARIN LEVEL (UNFRACTIONATED)
Heparin Unfractionated: 0.46 IU/mL (ref 0.30–0.70)
Heparin Unfractionated: 0.21 IU/mL — ABNORMAL LOW (ref 0.30–0.70)

## 2019-12-07 LAB — ALBUMIN: Albumin: 2.6 g/dL — ABNORMAL LOW (ref 3.5–5.0)

## 2019-12-07 MED ORDER — SODIUM CHLORIDE 0.9% FLUSH: 3.0000 mL | INTRAVENOUS | Status: DC | PRN

## 2019-12-07 MED ORDER — SODIUM CHLORIDE 0.9% FLUSH: 3.0000 mL | Freq: Two times a day (BID) | INTRAVENOUS | Status: DC

## 2019-12-07 MED ORDER — METOPROLOL TARTRATE 12.5 MG HALF TABLET
12.5000 mg | ORAL_TABLET | Freq: Four times a day (QID) | ORAL | Status: DC
  Administered 2019-12-07 – 2019-12-11 (×9): 12.5 mg via ORAL
  Filled 2019-12-07 (×11): qty 1

## 2019-12-07 MED ORDER — SODIUM CHLORIDE 0.9 % IV SOLN: INTRAVENOUS | Status: DC

## 2019-12-07 MED ORDER — CHLORHEXIDINE GLUCONATE CLOTH 2 % EX PADS
6.0000 | MEDICATED_PAD | Freq: Every day | CUTANEOUS | Status: DC
  Administered 2019-12-09: 6 via TOPICAL

## 2019-12-07 MED ORDER — RENA-VITE PO TABS
1.0000 | ORAL_TABLET | Freq: Every day | ORAL | Status: DC
  Administered 2019-12-07 – 2019-12-10 (×4): 1 via ORAL
  Filled 2019-12-07 (×4): qty 1

## 2019-12-07 MED ORDER — SODIUM CHLORIDE 0.9 % IV SOLN
250.0000 mL | INTRAVENOUS | Status: DC | PRN
  Administered 2019-12-08: 250 mL via INTRAVENOUS

## 2019-12-07 MED ORDER — ENSURE ENLIVE PO LIQD
237.0000 mL | Freq: Two times a day (BID) | ORAL | Status: DC
  Administered 2019-12-09 – 2019-12-10 (×2): 237 mL via ORAL

## 2019-12-07 NOTE — Consult Note (Signed)
Ref: Mauricia Area, MD   Subjective:  Feels better post additional dialysis this AM.  EKG in AM shows sinus rhythm with 2nd degree type 1 AV block. EKG appears to show multifocal atrial tachycardia. Episode of atrial fibrillation with RVR. Off amiodarone for prolonged QTc. Repeat measurement reveals lack of prolonged QTc  Objective:  Vital Signs in the last 24 hours: Temp:  [97.6 F (36.4 C)-98.4 F (36.9 C)] 97.8 F (36.6 C) (09/13 1311) Pulse Rate:  [46-107] 96 (09/13 1311) Cardiac Rhythm: Normal sinus rhythm (09/13 0820) Resp:  [16-29] 16 (09/13 1311) BP: (97-132)/(44-79) 128/68 (09/13 1311) SpO2:  [98 %-100 %] 98 % (09/13 1524) Weight:  [63.8 kg-66.7 kg] 63.8 kg (09/13 1226)  Physical Exam: BP Readings from Last 1 Encounters:  12/07/19 128/68     Wt Readings from Last 1 Encounters:  12/07/19 63.8 kg    Weight change:  Body mass index is 24.92 kg/m. HEENT: [place]/AT, Eyes-Blue, PERL, EOMI, Conjunctiva-Pink, Sclera-Non-icteric Neck: No JVD, No bruit, Trachea midline. Lungs:  Clearing, Bilateral. Cardiac:  Regular rhythm, normal S1 and S2, no S3. II/VI systolic murmur. Abdomen:  Soft, non-tender. BS present. Extremities:  Trace edema present. No cyanosis. No clubbing. CNS: AxOx3, Cranial nerves grossly intact, moves all 4 extremities.  Skin: Warm and dry.   Intake/Output from previous day: 09/12 0701 - 09/13 0700 In: 970.6 [P.O.:360; IV Piggyback:610.6] Out: 0     Lab Results: BMET    Component Value Date/Time   NA 139 12/07/2019 0240   NA 138 12/06/2019 0751   NA 139 12/05/2019 0420   NA 147 (H) 09/09/2008 1450   K 3.9 12/07/2019 0240   K 3.2 (L) 12/06/2019 0751   K 3.9 12/05/2019 0420   K 5.0 (H) 09/09/2008 1450   CL 101 12/07/2019 0240   CL 98 12/06/2019 0751   CL 97 (L) 12/05/2019 0420   CL 111 (H) 09/09/2008 1450   CO2 23 12/07/2019 0240   CO2 25 12/06/2019 0751   CO2 25 12/05/2019 0420   CO2 26 09/09/2008 1450   GLUCOSE 93 12/07/2019 0240    GLUCOSE 140 (H) 12/06/2019 0751   GLUCOSE 99 12/05/2019 0420   GLUCOSE 106 09/09/2008 1450   BUN 52 (H) 12/07/2019 0240   BUN 32 (H) 12/06/2019 0751   BUN 27 (H) 12/05/2019 0420   BUN 51 (H) 09/09/2008 1450   CREATININE 7.00 (H) 12/07/2019 0240   CREATININE 5.14 (H) 12/06/2019 0751   CREATININE 4.92 (H) 12/05/2019 0420   CREATININE 4.7 (H) 09/09/2008 1450   CALCIUM 10.5 (H) 12/07/2019 0240   CALCIUM 10.0 12/06/2019 0751   CALCIUM 9.4 12/05/2019 0420   CALCIUM 10.8 (H) 09/09/2008 1450   GFRNONAA 8 (L) 12/07/2019 0240   GFRNONAA 11 (L) 12/06/2019 0751   GFRNONAA 12 (L) 12/05/2019 0420   GFRAA 9 (L) 12/07/2019 0240   GFRAA 13 (L) 12/06/2019 0751   GFRAA 14 (L) 12/05/2019 0420   CBC    Component Value Date/Time   WBC 8.0 12/07/2019 0240   RBC 3.09 (L) 12/07/2019 0240   HGB 10.4 (L) 12/07/2019 0240   HGB 15.7 12/07/2008 1137   HCT 31.4 (L) 12/07/2019 0240   HCT 46.2 12/07/2008 1137   PLT 264 12/07/2019 0240   PLT 131 (L) 12/07/2008 1137   MCV 101.6 (H) 12/07/2019 0240   MCV 102 (H) 12/07/2008 1137   MCH 33.7 12/07/2019 0240   MCHC 33.1 12/07/2019 0240   RDW 13.8 12/07/2019 0240   RDW 12.0  12/07/2008 1137   LYMPHSABS 1.4 08/12/2019 2317   LYMPHSABS 2.2 12/07/2008 1137   MONOABS 1.0 08/12/2019 2317   EOSABS 0.1 08/12/2019 2317   EOSABS 0.2 12/07/2008 1137   BASOSABS 0.0 08/12/2019 2317   BASOSABS 0.1 12/07/2008 1137   HEPATIC Function Panel No results for input(s): PROT in the last 8760 hours.  Invalid input(s):  ALBUMIN,  AST,  ALT,  ALKPHOS,  BILIDIR,  IBILI HEMOGLOBIN A1C No components found for: HGA1C,  MPG CARDIAC ENZYMES Lab Results  Component Value Date   TROPONINI <0.30 07/05/2012   BNP No results for input(s): PROBNP in the last 8760 hours. TSH No results for input(s): TSH in the last 8760 hours. CHOLESTEROL No results for input(s): CHOL in the last 8760 hours.  Scheduled Meds: . allopurinol  300 mg Oral Daily  . amiodarone  200 mg Oral Daily  .  aspirin EC  81 mg Oral Daily  . Chlorhexidine Gluconate Cloth  6 each Topical Q0600  . cinacalcet  180 mg Oral Q T,Th,Sa-HD  . divalproex  250 mg Oral QHS  . feeding supplement (ENSURE ENLIVE)  237 mL Oral BID BM  . guaiFENesin  600 mg Oral BID  . lamoTRIgine  25 mg Oral BID  . levETIRAcetam  500 mg Oral BID  . metoprolol tartrate  12.5 mg Oral BID  . multivitamin  1 tablet Oral QHS  . sodium chloride flush  3 mL Intravenous Q12H  . sodium chloride flush  3 mL Intravenous Q12H   Continuous Infusions: . sodium chloride    . ceFEPime (MAXIPIME) IV 1 g (12/06/19 1420)  . doxycycline (VIBRAMYCIN) IV 100 mg (12/07/19 1603)  . heparin 1,150 Units/hr (12/07/19 1312)   PRN Meds:.sodium chloride, acetaminophen **OR** acetaminophen, melatonin, metoprolol tartrate, ondansetron **OR** ondansetron (ZOFRAN) IV, sodium chloride, sodium chloride flush, sorbitol  Assessment/Plan: Acute systolic left heart failure, HFrEF Bilateral multifocal pneumonia H/O non-ischemic dilated cardiomyopathy Severe mitral valve regurgitation Multifocal atrial tachycardia Paroxysmal atrial fibrillation ESRD Anemia of chronic disease  Resume amiodarone Increase metoprolol dose as tolerated. R + L Cardiac cath in AM.  Patient understood procedure, risks and alternatives. Also discussed need for advanced heart failure clinic referral, possible atrial fibrillation ablation and ICD placement if LV function do not improve with medical therapy.   LOS: 3 days   Time spent including chart review, lab review, examination, discussion with patient : 30 min   Dixie Dials  MD  12/07/2019, 6:05 PM

## 2019-12-07 NOTE — Progress Notes (Signed)
Physical Therapy Treatment Patient Details Name: Peter Sloan MRN: [id number] DOB: [birth year] Today's Date: 12/07/2019    History of Present Illness The pt is a 59 yo male presenting with SOB and hypoxic (85% on RA) at rest. Echo revealed EF of 20-25%, severe mitral regurgitation with plan for heart cath in future. PMH includes: HTN, HLD, HF, renal insufficiency, ESRD on HD TTS, and afib.    PT Comments    The pt is continuing to make good progress towards therapy goals as he was able to progress gait ambulation and initiate stair training while on 3L O2 (down from 6L). The pt continues to report no SOB with gait, but is limited by general fatigue with mobility and will continue to benefit from skilled PT to progress dynamic stability and higher-level balance challenges to improve safety with mobility following d/c.     Follow Up Recommendations  No PT follow up;Supervision for mobility/OOB     Equipment Recommendations  Other (comment) (pt has needed equipment)    Recommendations for Other Services       Precautions / Restrictions Precautions Precautions: Fall Restrictions Weight Bearing Restrictions: No    Mobility  Bed Mobility Overal bed mobility: Needs Assistance Bed Mobility: Supine to Sit;Sit to Supine     Supine to sit: Min guard Sit to supine: Min guard   General bed mobility comments: minG with line managment for safety, HOB elevated  Transfers Overall transfer level: Needs assistance Equipment used: None Transfers: Sit to/from Stand Sit to Stand: Supervision         General transfer comment: supervision for safety, able to complete from various surfaces without assist to power up  Ambulation/Gait Ambulation/Gait assistance: Min guard Gait Distance (Feet): 200 Feet Assistive device: None Gait Pattern/deviations: Step-through pattern;Decreased stride length;Drifts right/left   Gait velocity interpretation: <1.8 ft/sec, indicate of risk for  recurrent falls General Gait Details: pt able to ambulate without AD, but still has 5-8 instances of scissoring or lateral drifting requiring minA to correct during initial part of ambulaiton. improved stability with continued walking. SpO2 > 96% on 3L when reading can be obtained   Stairs Stairs: Yes Stairs assistance: Min guard Stair Management: One rail Right;Forwards Number of Stairs: 1 (x10) General stair comments: pt completed x10 step ups onto first step to initiate stair training. no DOE of change in SpO2   Wheelchair Mobility    Modified Rankin (Stroke Patients Only)       Balance Overall balance assessment: Needs assistance Sitting-balance support: No upper extremity supported;Feet supported Sitting balance-Leahy Scale: Good     Standing balance support: Single extremity supported;During functional activity Standing balance-Leahy Scale: Fair Standing balance comment: intermittent UE support for gait                            Cognition Arousal/Alertness: Awake/alert Behavior During Therapy: WFL for tasks assessed/performed Overall Cognitive Status: Within Functional Limits for tasks assessed                                        Exercises      General Comments General comments (skin integrity, edema, etc.): SpO2 > 96% on 3L O2      Pertinent Vitals/Pain Pain Assessment: No/denies pain    Home Living  Prior Function            PT Goals (current goals can now be found in the care plan section) Acute Rehab PT Goals Patient Stated Goal: return home, get stronger PT Goal Formulation: With patient Time For Goal Achievement: 12/19/19 Potential to Achieve Goals: Good Progress towards PT goals: Progressing toward goals    Frequency    Min 3X/week      PT Plan Current plan remains appropriate    Co-evaluation              AM-PAC PT "6 Clicks" Mobility   Outcome Measure  Help  needed turning from your back to your side while in a flat bed without using bedrails?: None Help needed moving from lying on your back to sitting on the side of a flat bed without using bedrails?: A Little Help needed moving to and from a bed to a chair (including a wheelchair)?: A Little Help needed standing up from a chair using your arms (e.g., wheelchair or bedside chair)?: None Help needed to walk in hospital room?: A Little Help needed climbing 3-5 steps with a railing? : A Little 6 Click Score: 20    End of Session Equipment Utilized During Treatment: Gait belt;Oxygen (3L) Activity Tolerance: Patient tolerated treatment well Patient left: in bed;with call bell/phone within reach;with bed alarm set Nurse Communication: Mobility status (pt request set up for bath) PT Visit Diagnosis: Difficulty in walking, not elsewhere classified (R26.2)     Time: 1017-5102 PT Time Calculation (min) (ACUTE ONLY): 26 min  Charges:  $Gait Training: 23-37 mins                     Karma Ganja, PT, DPT   Acute Rehabilitation Department Pager #: [phone]   Otho Bellows 12/07/2019, 3:29 PM

## 2019-12-07 NOTE — H&P (View-Only) (Signed)
Ref: Mauricia Area, MD   Subjective:  Feels better post additional dialysis this AM.  EKG in AM shows sinus rhythm with 2nd degree type 1 AV block. EKG appears to show multifocal atrial tachycardia. Episode of atrial fibrillation with RVR. Off amiodarone for prolonged QTc. Repeat measurement reveals lack of prolonged QTc  Objective:  Vital Signs in the last 24 hours: Temp:  [97.6 F (36.4 C)-98.4 F (36.9 C)] 97.8 F (36.6 C) (09/13 1311) Pulse Rate:  [46-107] 96 (09/13 1311) Cardiac Rhythm: Normal sinus rhythm (09/13 0820) Resp:  [16-29] 16 (09/13 1311) BP: (97-132)/(44-79) 128/68 (09/13 1311) SpO2:  [98 %-100 %] 98 % (09/13 1524) Weight:  [63.8 kg-66.7 kg] 63.8 kg (09/13 1226)  Physical Exam: BP Readings from Last 1 Encounters:  12/07/19 128/68     Wt Readings from Last 1 Encounters:  12/07/19 63.8 kg    Weight change:  Body mass index is 24.92 kg/m. HEENT: /AT, Eyes-Blue, PERL, EOMI, Conjunctiva-Pink, Sclera-Non-icteric Neck: No JVD, No bruit, Trachea midline. Lungs:  Clearing, Bilateral. Cardiac:  Regular rhythm, normal S1 and S2, no S3. II/VI systolic murmur. Abdomen:  Soft, non-tender. BS present. Extremities:  Trace edema present. No cyanosis. No clubbing. CNS: AxOx3, Cranial nerves grossly intact, moves all 4 extremities.  Skin: Warm and dry.   Intake/Output from previous day: 09/12 0701 - 09/13 0700 In: 970.6 [P.O.:360; IV Piggyback:610.6] Out: 0     Lab Results: BMET    Component Value Date/Time   NA 139 12/07/2019 0240   NA 138 12/06/2019 0751   NA 139 12/05/2019 0420   NA 147 (H) 09/09/2008 1450   K 3.9 12/07/2019 0240   K 3.2 (L) 12/06/2019 0751   K 3.9 12/05/2019 0420   K 5.0 (H) 09/09/2008 1450   CL 101 12/07/2019 0240   CL 98 12/06/2019 0751   CL 97 (L) 12/05/2019 0420   CL 111 (H) 09/09/2008 1450   CO2 23 12/07/2019 0240   CO2 25 12/06/2019 0751   CO2 25 12/05/2019 0420   CO2 26 09/09/2008 1450   GLUCOSE 93 12/07/2019 0240    GLUCOSE 140 (H) 12/06/2019 0751   GLUCOSE 99 12/05/2019 0420   GLUCOSE 106 09/09/2008 1450   BUN 52 (H) 12/07/2019 0240   BUN 32 (H) 12/06/2019 0751   BUN 27 (H) 12/05/2019 0420   BUN 51 (H) 09/09/2008 1450   CREATININE 7.00 (H) 12/07/2019 0240   CREATININE 5.14 (H) 12/06/2019 0751   CREATININE 4.92 (H) 12/05/2019 0420   CREATININE 4.7 (H) 09/09/2008 1450   CALCIUM 10.5 (H) 12/07/2019 0240   CALCIUM 10.0 12/06/2019 0751   CALCIUM 9.4 12/05/2019 0420   CALCIUM 10.8 (H) 09/09/2008 1450   GFRNONAA 8 (L) 12/07/2019 0240   GFRNONAA 11 (L) 12/06/2019 0751   GFRNONAA 12 (L) 12/05/2019 0420   GFRAA 9 (L) 12/07/2019 0240   GFRAA 13 (L) 12/06/2019 0751   GFRAA 14 (L) 12/05/2019 0420   CBC    Component Value Date/Time   WBC 8.0 12/07/2019 0240   RBC 3.09 (L) 12/07/2019 0240   HGB 10.4 (L) 12/07/2019 0240   HGB 15.7 12/07/2008 1137   HCT 31.4 (L) 12/07/2019 0240   HCT 46.2 12/07/2008 1137   PLT 264 12/07/2019 0240   PLT 131 (L) 12/07/2008 1137   MCV 101.6 (H) 12/07/2019 0240   MCV 102 (H) 12/07/2008 1137   MCH 33.7 12/07/2019 0240   MCHC 33.1 12/07/2019 0240   RDW 13.8 12/07/2019 0240   RDW 12.0  12/07/2008 1137   LYMPHSABS 1.4 08/12/2019 2317   LYMPHSABS 2.2 12/07/2008 1137   MONOABS 1.0 08/12/2019 2317   EOSABS 0.1 08/12/2019 2317   EOSABS 0.2 12/07/2008 1137   BASOSABS 0.0 08/12/2019 2317   BASOSABS 0.1 12/07/2008 1137   HEPATIC Function Panel No results for input(s): PROT in the last 8760 hours.  Invalid input(s):  ALBUMIN,  AST,  ALT,  ALKPHOS,  BILIDIR,  IBILI HEMOGLOBIN A1C No components found for: HGA1C,  MPG CARDIAC ENZYMES Lab Results  Component Value Date   TROPONINI <0.30 07/05/2012   BNP No results for input(s): PROBNP in the last 8760 hours. TSH No results for input(s): TSH in the last 8760 hours. CHOLESTEROL No results for input(s): CHOL in the last 8760 hours.  Scheduled Meds: . allopurinol  300 mg Oral Daily  . amiodarone  200 mg Oral Daily  .  aspirin EC  81 mg Oral Daily  . Chlorhexidine Gluconate Cloth  6 each Topical Q0600  . cinacalcet  180 mg Oral Q T,Th,Sa-HD  . divalproex  250 mg Oral QHS  . feeding supplement (ENSURE ENLIVE)  237 mL Oral BID BM  . guaiFENesin  600 mg Oral BID  . lamoTRIgine  25 mg Oral BID  . levETIRAcetam  500 mg Oral BID  . metoprolol tartrate  12.5 mg Oral BID  . multivitamin  1 tablet Oral QHS  . sodium chloride flush  3 mL Intravenous Q12H  . sodium chloride flush  3 mL Intravenous Q12H   Continuous Infusions: . sodium chloride    . ceFEPime (MAXIPIME) IV 1 g (12/06/19 1420)  . doxycycline (VIBRAMYCIN) IV 100 mg (12/07/19 1603)  . heparin 1,150 Units/hr (12/07/19 1312)   PRN Meds:.sodium chloride, acetaminophen **OR** acetaminophen, melatonin, metoprolol tartrate, ondansetron **OR** ondansetron (ZOFRAN) IV, sodium chloride, sodium chloride flush, sorbitol  Assessment/Plan: Acute systolic left heart failure, HFrEF Bilateral multifocal pneumonia H/O non-ischemic dilated cardiomyopathy Severe mitral valve regurgitation Multifocal atrial tachycardia Paroxysmal atrial fibrillation ESRD Anemia of chronic disease  Resume amiodarone Increase metoprolol dose as tolerated. R + L Cardiac cath in AM.  Patient understood procedure, risks and alternatives. Also discussed need for advanced heart failure clinic referral, possible atrial fibrillation ablation and ICD placement if LV function do not improve with medical therapy.   LOS: 3 days   Time spent including chart review, lab review, examination, discussion with patient : 30 min   Dixie Dials  MD  12/07/2019, 6:05 PM

## 2019-12-07 NOTE — Progress Notes (Signed)
Patient's BP 98/60, HR 88 Patient has metorpolol due tonight.MD,Shalhoub text paged. Order received in epic. Will continue to monitor.

## 2019-12-07 NOTE — Progress Notes (Addendum)
Initial Nutrition Assessment  DOCUMENTATION CODES:   Not applicable  INTERVENTION:  Ensure Enlive po BID, each supplement provides 350 kcal and 20 grams of protein  Renavit daily   NUTRITION DIAGNOSIS:   Increased nutrient needs related to chronic illness (ESRD on HD) as evidenced by estimated needs.    GOAL:   Patient will meet greater than or equal to 90% of their needs    MONITOR:   PO intake, Supplement acceptance, Weight trends, Labs, I & O's  REASON FOR ASSESSMENT:   Consult Assessment of nutrition requirement/status  ASSESSMENT:   Pt admitted with acute hypoxic respiratory failure 2/2 acute systolic heart failure exacerbation and/or PNA.  PMH includes ESRD on HD, cardiomyopathy, seizure disorder, Afib, h/o congenital hypotrophic kidneys, S/P failed renal transplant.  Pt unavailable at time of RD visit.   Per Nephrology, pt has been leaving 0.3-0.4 kg under his EDW for the past 3 HD treatments PTA. Pt now weighs even less than EDW and will need new EDW upon discharge per MD.   Current EDW 69.5 kg Current wt 66.7 kg Admit wt: 68.9 kg Last HD 9/11, net UF 2523ml  PO Intake: 0-100% x 6 recorded meals (55% average meal intake)  Labs: K+ 3.2 (L) Medications: Sensipar  NUTRITION - FOCUSED PHYSICAL EXAM:  Unable to perform at this time; will attempt at follow-up.   Diet Order:   Diet Order            Diet renal with fluid restriction Fluid restriction: 1200 mL Fluid; Room service appropriate? Yes; Fluid consistency: Thin  Diet effective now                 EDUCATION NEEDS:   No education needs have been identified at this time  Skin:  Skin Assessment: Reviewed RN Assessment  Last BM:  9/11 type 6  Height:   Ht Readings from Last 1 Encounters:  12/04/19 5\' 3"  (1.6 m)    Weight:   Wt Readings from Last 1 Encounters:  12/07/19 66.7 kg    BMI:  Body mass index is 26.05 kg/m.  Estimated Nutritional Needs:   Kcal:   2100-2300  Protein:  105-115 grams  Fluid:  1023ml + UOP    Larkin Ina, MS, RD, LDN RD pager number and weekend/on-call pager number located in [place].

## 2019-12-07 NOTE — Progress Notes (Signed)
PROGRESS NOTE    Peter Sloan  YYT:[id number] DOB: 07-19-1960 DOA: 12/03/2019 PCP: Mauricia Area, MD   Brief Narrative: 59 year old with past medical history significant for ESRD on hemodialysis, cardiomyopathy with ejection fraction 30 to 35%, seizure disorder, paroxysmal atrial fibrillation not on anticoagulation, presents complaining of worsening shortness of breath and cough for the last 1 or 2 weeks.  He report productive cough.  Evaluation in the ED patient was found to be afebrile, hypoxic 85 on room air, tachypneic, chest x-ray with widespread bilateral groundglass airspace disease suggestive of multifocal pneumonia or edema.  Elevated BNP, mild elevation of troponin.  Covid PCR negative.  Patient develops A. Fib RVR 9/12. Started on heparin Gtt.   Assessment & Plan:   Principal Problem:   Acute respiratory failure with hypoxia (HCC) Active Problems:   Hypokalemia   Seizure disorder (HCC)   End stage renal disease on dialysis [facility])   Atrial fibrillation (HCC)  1-Acute Hypoxic Respiratory Failure secondary to Acute systolic heart failure exacerbation and or pneumonia: -Patient presented with hypoxemia, dyspnea, elevated BNP 4000, chest x-ray asymmetric edema versus pneumonia. Prior EF 35 %. -Nephrology consulted. Had HD 9/10 and 9/11. 9/13 -Received two doses of Azithro, due to prolong QT change azithromycin to Doxy.  -Continue with cefepime. Pro-calcitonin decreased to 2.9 -Continue with  Guaifenesin.  -Flutter valve.  -Chest x ray 9/12: Marked improvement in aeration throughout both lungs since the examination 3 days ago, though patchy ground-glass airspace opacities persist, indicating improving edema versus pneumonia. Residual nodular opacity at the LEFT lung base, likely in the lingula. Needs chest x ray follow up.  -He is breathing better, currently oxygen down to 3 L.   2-Acute systolic heart failure exacerbation, paroxysmal A. fib: Cardiology consulted. Echo  with lower EF at 20, Severe mitral regurgitation.  Cardiology recommending cath in or out patient. Awaiting improvement of PNA>  Dr Doylene Canard will discussed with patient and family timing for Cath (inpatient vs outpatient.)  3-Hypokalemia: Resolved.   4-ESRD: He had hemodialysis on 9/9 and 9/11. 9/13 -Chest x-ray with pulmonary edema versus pneumonia.  BNP elevated.    5-Paroxysmal A. Fib: Cardiology consulted. A fib RVR morning of 9/12. Amiodarone held due to prololng QT 9/12. EKG showed a fib RVR. PRN lopressor ordered.  He converted to sinus rhythm 9-12. Rhythm back in to A fib (monitor on telemetry ) rate 109 during HD. Dr Doylene Canard informed, he will adjust medications.  EKG with Mobitz type , informed Dr Doylene Canard.   6-Mild elevation of troponin: Related to demand ischemia in the setting of heart failure exacerbation. Cardiology following.  7-Seizure disorder: Continue with Keppra and Depakote.  8-Metabolic Bone diseases. Per Nephrology.  On sensipar.   9-Constipation; Sorbitol PRN ordered.  10-Nutrition; nutrition consulted.   Estimated body mass index is 26.05 kg/m as calculated from the following:   Height as of this encounter: 5\' 3"  (1.6 m).   Weight as of this encounter: 66.7 kg.   DVT prophylaxis: Heparin Code Status: Full code Family Communication: Care discussed with patient Disposition Plan:  Status is: Inpatient  Remains inpatient appropriate because:IV treatments appropriate due to intensity of illness or inability to take PO   Dispo: The patient is from: Home              Anticipated d/c is to: Home              Anticipated d/c date is: 3 days  Patient currently is not medically stable to d/c.        Consultants:   Nephrology  Cardiology  Procedures:   HD  ECHO;  Antimicrobials:  Ceftriaxone 9/10 Azithromycin 9/10.  Subjective: Seen during HD, denies dyspnea, chest pain. His telemetry was beeping.  His telemetry monitor  showed A. Fib.   Objective: Vitals:   12/07/19 1030 12/07/19 1100 12/07/19 1115 12/07/19 1130  BP: (!) 112/50 (!) 97/44 121/64 112/64  Pulse: (!) 46 70 83 (!) 107  Resp: (!) 27     Temp:      TempSrc:      SpO2:      Weight:      Height:        Intake/Output Summary (Last 24 hours) at 12/07/2019 1156 Last data filed at 12/07/2019 0900 Gross per 24 hour  Intake 970.59 ml  Output 0 ml  Net 970.59 ml   Filed Weights   12/05/19 1055 12/06/19 0526 12/07/19 0821  Weight: 65.2 kg 67.1 kg 66.7 kg    Examination:  General exam: NAD Respiratory system: No respiratory effort, crackles at the bases Cardiovascular system: S1, S2 Irregular rhythm or rate Gastrointestinal system: Bowel sounds present, soft nontender not distended Central nervous system: Alert, conversant Extremities: No edema Skin: No rashes   Data Reviewed: I have personally reviewed following labs and imaging studies  CBC: Recent Labs  Lab 12/03/19 1656 12/04/19 0635 12/05/19 0420 12/06/19 0751 12/07/19 0240  WBC 10.9* 10.5 9.5 8.7 8.0  HGB 11.3* 10.8* 10.5* 11.2* 10.4*  HCT 33.9* 33.3* 32.4* 34.4* 31.4*  MCV 100.9* 102.1* 101.6* 100.3* 101.6*  PLT 308 280 240 280 322   Basic Metabolic Panel: Recent Labs  Lab 12/03/19 1656 12/04/19 0635 12/05/19 0420 12/05/19 1305 12/06/19 0751 12/07/19 0240  NA 137 137 139  --  138 139  K 3.2* 4.4 3.9  --  3.2* 3.9  CL 93* 93* 97*  --  98 101  CO2 31 29 25   --  25 23  GLUCOSE 117* 113* 99  --  140* 93  BUN 12 23* 27*  --  32* 52*  CREATININE 3.84* 5.44* 4.92*  --  5.14* 7.00*  CALCIUM 8.7* 9.0 9.4  --  10.0 10.5*  MG  --  2.1  --   --  2.2  --   PHOS  --   --   --  5.7*  --   --    GFR: Estimated Creatinine Clearance: 9.4 mL/min (A) (by C-G formula based on SCr of 7 mg/dL (H)). Liver Function Tests: No results for input(s): AST, ALT, ALKPHOS, BILITOT, PROT, ALBUMIN in the last 168 hours. No results for input(s): LIPASE, AMYLASE in the last 168  hours. No results for input(s): AMMONIA in the last 168 hours. Coagulation Profile: No results for input(s): INR, PROTIME in the last 168 hours. Cardiac Enzymes: No results for input(s): CKTOTAL, CKMB, CKMBINDEX, TROPONINI in the last 168 hours. BNP (last 3 results) No results for input(s): PROBNP in the last 8760 hours. HbA1C: No results for input(s): HGBA1C in the last 72 hours. CBG: No results for input(s): GLUCAP in the last 168 hours. Lipid Profile: No results for input(s): CHOL, HDL, LDLCALC, TRIG, CHOLHDL, LDLDIRECT in the last 72 hours. Thyroid Function Tests: No results for input(s): TSH, T4TOTAL, FREET4, T3FREE, THYROIDAB in the last 72 hours. Anemia Panel: No results for input(s): VITAMINB12, FOLATE, FERRITIN, TIBC, IRON, RETICCTPCT in the last 72 hours. Sepsis Labs: Recent Labs  Lab  12/04/19 0635 12/05/19 0420  PROCALCITON 1.88 3.20    Recent Results (from the past 240 hour(s))  SARS Coronavirus 2 by RT PCR (hospital order, performed in [facility] hospital lab) Nasopharyngeal Nasopharyngeal Swab     Status: None   Collection Time: 12/03/19  4:54 PM   Specimen: Nasopharyngeal Swab  Result Value Ref Range Status   SARS Coronavirus 2 NEGATIVE NEGATIVE Final    Comment: (NOTE) SARS-CoV-2 target nucleic acids are NOT DETECTED.  The SARS-CoV-2 RNA is generally detectable in upper and lower respiratory specimens during the acute phase of infection. The lowest concentration of SARS-CoV-2 viral copies this assay can detect is 250 copies / mL. A negative result does not preclude SARS-CoV-2 infection and should not be used as the sole basis for treatment or other patient management decisions.  A negative result may occur with improper specimen collection / handling, submission of specimen other than nasopharyngeal swab, presence of viral mutation(s) within the areas targeted by this assay, and inadequate number of viral copies (<250 copies / mL). A negative result must  be combined with clinical observations, patient history, and epidemiological information.  Fact Sheet for Patients:   StrictlyIdeas.no  Fact Sheet for Healthcare Providers: BankingDealers.co.za  This test is not yet approved or  cleared by the [place] FDA and has been authorized for detection and/or diagnosis of SARS-CoV-2 by FDA under an Emergency Use Authorization (EUA).  This EUA will remain in effect (meaning this test can be used) for the duration of the COVID-19 declaration under Section 564(b)(1) of the Act, 21 U.S.C. section 360bbb-3(b)(1), unless the authorization is terminated or revoked sooner.  Performed at [facility], [place] [address] [place], [place] [address]   Culture, sputum-assessment     Status: None   Collection Time: 12/04/19 12:10 PM   Specimen: Expectorated Sputum  Result Value Ref Range Status   Specimen Description EXPECTORATED SPUTUM  Final   Special Requests NONE  Final   Sputum evaluation   Final    Sputum specimen not acceptable for testing.  Please recollect.   J,FOKUO RN @[phone]  12/04/19 EB Performed at [facility] [address], [place], [place] [address]    Report Status 12/04/2019 FINAL  Final  MRSA PCR Screening     Status: None   Collection Time: 12/04/19 11:43 PM   Specimen: Nasopharyngeal  Result Value Ref Range Status   MRSA by PCR NEGATIVE NEGATIVE Final    Comment:        The GeneXpert MRSA Assay (FDA approved for NASAL specimens only), is one component of a comprehensive MRSA colonization surveillance program. It is not intended to diagnose MRSA infection nor to guide or monitor treatment for MRSA infections. Performed at [facility], [place] [address] [place], [place] [address]          Radiology Studies: DG Chest 2 View  Result Date: 12/06/2019 CLINICAL DATA:  Follow-up CHF versus multifocal pneumonia. EXAM: CHEST - 2 VIEW COMPARISON:  12/03/2019  and earlier. FINDINGS: AP ERECT and LATERAL images were obtained. Cardiac silhouette moderately enlarged, unchanged. Thoracic aorta mildly tortuous and atherosclerotic, unchanged. Marked improvement in aeration throughout both lungs since the examination 3 days ago, though patchy ground-glass airspace opacities persist. A more focal nodular appearing opacity is present at the LEFT lung base, likely in the lingula. Small BILATERAL pleural effusions are unchanged. No new pulmonary parenchymal abnormalities. Degenerative changes throughout the thoracic spine and thoracic dextroscoliosis and lumbar levoscoliosis as  noted previously. IMPRESSION: 1. Marked improvement in aeration throughout both lungs since the examination 3 days ago, though patchy ground-glass airspace opacities persist, indicating improving edema versus pneumonia. 2. Residual nodular opacity at the LEFT lung base, likely in the lingula. Follow-up chest x-ray to resolution of the acute CHF versus pneumonia is recommended to be sure that this opacity resolves. 3. Stable small BILATERAL pleural effusions. 4. No new abnormalities. Electronically Signed   By: Evangeline Dakin M.D.   On: 12/06/2019 09:16        Scheduled Meds:  allopurinol  300 mg Oral Daily   amiodarone  200 mg Oral Daily   aspirin EC  81 mg Oral Daily   Chlorhexidine Gluconate Cloth  6 each Topical Q0600   Chlorhexidine Gluconate Cloth  6 each Topical Q0600   cinacalcet  180 mg Oral Q T,Th,Sa-HD   divalproex  250 mg Oral QHS   feeding supplement (ENSURE ENLIVE)  237 mL Oral BID BM   guaiFENesin  600 mg Oral BID   lamoTRIgine  25 mg Oral BID   levETIRAcetam  500 mg Oral BID   metoprolol tartrate  12.5 mg Oral BID   multivitamin  1 tablet Oral QHS   sodium chloride flush  3 mL Intravenous Q12H   sodium chloride flush  3 mL Intravenous Q12H   Continuous Infusions:  sodium chloride     ceFEPime (MAXIPIME) IV 1 g (12/06/19 1420)   doxycycline  (VIBRAMYCIN) IV 100 mg (12/06/19 2154)   heparin 1,150 Units/hr (12/06/19 1934)     LOS: 3 days    Time spent: 35 minutes.     Elmarie Shiley, MD [facility]   If 7PM-7AM, please contact night-coverage [email or web address]  12/07/2019, 11:56 AM

## 2019-12-07 NOTE — Progress Notes (Signed)
[facility] Progress Note   Subjective:   Seen on HD. In and out of a.fib on monitor with HR staying in the 90s-low 100's. BP stable. Patient is asymptomatic, denies CP, palpitations, dizziness. Reports SOB continues to improve. No abdominal pain, N/V/D.   Objective Vitals:   12/07/19 1000 12/07/19 1030 12/07/19 1100 12/07/19 1115  BP: 109/65 (!) 112/50 (!) 97/44 121/64  Pulse: (!) 47 (!) 46 70 83  Resp: (!) 27 (!) 27    Temp:      TempSrc:      SpO2:      Weight:      Height:       Physical Exam General: Well developed, alert, in NAD Heart: Irregularly irregular, rate controlled. No murmurs, rubs or gallops Lungs: CTA bilaterally without wheezing, rhonchi or rales Abdomen: Soft, non-tender, non-distended, +BS Extremities: No edema b/l lower extremities  Dialysis Access: LUE AVF+ bruit  Additional Objective Labs: Basic Metabolic Panel: Recent Labs  Lab 12/05/19 0420 12/05/19 1305 12/06/19 0751 12/07/19 0240  NA 139  --  138 139  K 3.9  --  3.2* 3.9  CL 97*  --  98 101  CO2 25  --  25 23  GLUCOSE 99  --  140* 93  BUN 27*  --  32* 52*  CREATININE 4.92*  --  5.14* 7.00*  CALCIUM 9.4  --  10.0 10.5*  PHOS  --  5.7*  --   --    CBC: Recent Labs  Lab 12/03/19 1656 12/03/19 1656 12/04/19 0635 12/04/19 0635 12/05/19 0420 12/06/19 0751 12/07/19 0240  WBC 10.9*   < > 10.5   < > 9.5 8.7 8.0  HGB 11.3*   < > 10.8*   < > 10.5* 11.2* 10.4*  HCT 33.9*   < > 33.3*   < > 32.4* 34.4* 31.4*  MCV 100.9*  --  102.1*  --  101.6* 100.3* 101.6*  PLT 308   < > 280   < > 240 280 264   < > = values in this interval not displayed.   Blood Culture    Component Value Date/Time   SDES EXPECTORATED SPUTUM 12/04/2019 1210   SPECREQUEST NONE 12/04/2019 1210   REPTSTATUS 12/04/2019 FINAL 12/04/2019 1210    Cardiac Enzymes: No results for input(s): CKTOTAL, CKMB, CKMBINDEX, TROPONINI in the last 168 hours. CBG: No results for input(s): GLUCAP in the last 168  hours. Iron Studies: No results for input(s): IRON, TIBC, TRANSFERRIN, FERRITIN in the last 72 hours. @lablastinr3 @ Studies/Results: DG Chest 2 View  Result Date: 12/06/2019 CLINICAL DATA:  Follow-up CHF versus multifocal pneumonia. EXAM: CHEST - 2 VIEW COMPARISON:  12/03/2019 and earlier. FINDINGS: AP ERECT and LATERAL images were obtained. Cardiac silhouette moderately enlarged, unchanged. Thoracic aorta mildly tortuous and atherosclerotic, unchanged. Marked improvement in aeration throughout both lungs since the examination 3 days ago, though patchy ground-glass airspace opacities persist. A more focal nodular appearing opacity is present at the LEFT lung base, likely in the lingula. Small BILATERAL pleural effusions are unchanged. No new pulmonary parenchymal abnormalities. Degenerative changes throughout the thoracic spine and thoracic dextroscoliosis and lumbar levoscoliosis as noted previously. IMPRESSION: 1. Marked improvement in aeration throughout both lungs since the examination 3 days ago, though patchy ground-glass airspace opacities persist, indicating improving edema versus pneumonia. 2. Residual nodular opacity at the LEFT lung base, likely in the lingula. Follow-up chest x-ray to resolution of the acute CHF versus pneumonia is recommended to be sure that this  opacity resolves. 3. Stable small BILATERAL pleural effusions. 4. No new abnormalities. Electronically Signed   By: Evangeline Dakin M.D.   On: 12/06/2019 09:16   Medications: . sodium chloride    . ceFEPime (MAXIPIME) IV 1 g (12/06/19 1420)  . doxycycline (VIBRAMYCIN) IV 100 mg (12/06/19 2154)  . heparin 1,150 Units/hr (12/06/19 1934)   . allopurinol  300 mg Oral Daily  . amiodarone  200 mg Oral Daily  . aspirin EC  81 mg Oral Daily  . Chlorhexidine Gluconate Cloth  6 each Topical Q0600  . cinacalcet  180 mg Oral Q T,Th,Sa-HD  . divalproex  250 mg Oral QHS  . feeding supplement (ENSURE ENLIVE)  237 mL Oral BID BM  .  guaiFENesin  600 mg Oral BID  . lamoTRIgine  25 mg Oral BID  . levETIRAcetam  500 mg Oral BID  . metoprolol tartrate  12.5 mg Oral BID  . multivitamin  1 tablet Oral QHS  . sodium chloride flush  3 mL Intravenous Q12H  . sodium chloride flush  3 mL Intravenous Q12H    Dialysis Orders: [facility] T,Th,S 4 hours 180 NRe 400/800 69.5 kg 2.0 K/ 2.0 Ca AVG -Heparin 6800 units IV TIW -Sensipar 180 mg PO TIW -Calcitriol 1.0 mcg PO TIW (last PTH 744 11/05/2019)  Assessment/Plan: 1. Pulmonary edema/Volume overload: Has been leaving under EDW at OP center, H/O HFrEF. CXR significantly improved with serial HD. Still requiring supplemental O2, extra HD today then resume TTS schedule, Also has lower EF than usual (30% > 20%) and cardiology is evaluating.  2. A. Fib- Per primary/cardiology. On heparin drip, amiodarone and metoprolol. Keep potassium >4, using high K+ bath with HD.  3. Possible PNA-ABX per primary- doxycycline and cefepime.  4. ESRD -usual HD TTS. Serial HD this admission for volume. Next HD tomorrow, can likely resume TTS schedule.  5. Hypertension/volume - As noted above. BP is controlled (on metoprolol for a.fib). Pt reports max tolerable UF is 2.5L.  6. Anemia - HGB 10.13m  No ESA needed. 7. Metabolic bone disease -Chronic issues with hyperphosphatemia as OP. Calcium up to 10.5, will add on albumin. No contributory meds on list. Hold calcitriol, continue sensipar.  Last PTH was 664 on 9/9, may need to increase sensipar dose depending on corrected calcium level. Phos close to goal- 5.7.  8. Nutrition - Renal diet with fld restrictions.  8. H/O Afib. Not on anticoagulation. On amiodarone, metoprolol.  9. H/O HFrEF. Repeat ECHOEF down 20-25% (was 30-35%).Dr. Audria Nine, PA-C 12/07/2019, 11:30 AM  [facility] Pager: [phone]

## 2019-12-07 NOTE — Progress Notes (Signed)
[place] for Heparin Indication: atrial fibrillation  Allergies  Allergen Reactions  . Diphenhydramine Hcl Palpitations    Patient Measurements: Height: 5\' 3"  (160 cm) Weight: 67.1 kg (147 lb 14.9 oz) IBW/kg (Calculated) : 56.9 Heparin Dosing Weight: 67.1 kg  Vital Signs: Temp: 98.2 F (36.8 C) (09/13 0507) BP: 132/79 (09/13 0507) Pulse Rate: 86 (09/13 0507)  Labs: Recent Labs    12/05/19 0420 12/05/19 0420 12/06/19 0751 12/06/19 1809 12/07/19 0240  HGB 10.5*   < > 11.2*  --  10.4*  HCT 32.4*  --  34.4*  --  31.4*  PLT 240  --  280  --  264  HEPARINUNFRC  --   --   --  0.23* 0.46  CREATININE 4.92*  --  5.14*  --  7.00*   < > = values in this interval not displayed.   Assessment:  59 yr old male to begin IV heparin for atrial fibrillation. Hx PAF but no anticoagulation PTA due to hx bleeding episodes from HD cath site while on Warfarin in the past.  Has been on SQ heparin for VTE prophylaxis.   AM heparin level therapeutic at 0.46.  Goal of Therapy:  Heparin level 0.3-0.7 units/ml Monitor platelets by anticoagulation protocol: Yes   Plan:  Continue heparin at 1150 units/h Recheck confirmatory heparin level in 6 hours   Shonteria Abeln A. Levada Dy, PharmD, BCPS, FNKF Clinical Pharmacist  Please utilize Amion for appropriate phone number to reach the unit pharmacist ([place])   12/07/2019

## 2019-12-07 NOTE — Progress Notes (Addendum)
OT Cancellation Note  Patient Details Name: Peter Sloan MRN: [id number] DOB: [birth year]   Cancelled Treatment:    Reason Eval/Treat Not Completed: Patient at procedure or test/ unavailable. Pt currently out of the room for a HD. Plan to reattempt at a later time/date.   Tyrone Schimke, OT Acute Rehabilitation Services Pager: [phone] Office: [phone]  12/07/2019, 9:45 AM

## 2019-12-08 ENCOUNTER — Encounter (HOSPITAL_COMMUNITY)
Admission: EM | Disposition: A | Discharge status: Home / Self Care | Payer: Self-pay | Source: Home / Self Care | Attending: Internal Medicine

## 2019-12-08 ENCOUNTER — Encounter (HOSPITAL_COMMUNITY): Payer: Self-pay | Admitting: Cardiovascular Disease

## 2019-12-08 HISTORY — PX: RIGHT/LEFT HEART CATH AND CORONARY ANGIOGRAPHY: CATH118266

## 2019-12-08 LAB — BASIC METABOLIC PANEL
Anion gap: 13 (ref 5–15)
BUN: 35 mg/dL — ABNORMAL HIGH (ref 6–20)
CO2: 22 mmol/L (ref 22–32)
Calcium: 10.1 mg/dL (ref 8.9–10.3)
Chloride: 104 mmol/L (ref 98–111)
Creatinine, Ser: 5.37 mg/dL — ABNORMAL HIGH (ref 0.61–1.24)
GFR calc Af Amer: 13 mL/min — ABNORMAL LOW (ref >60)
GFR calc non Af Amer: 11 mL/min — ABNORMAL LOW (ref >60)
Glucose, Bld: 87 mg/dL (ref 70–99)
Potassium: 4.3 mmol/L (ref 3.5–5.1)
Sodium: 139 mmol/L (ref 135–145)

## 2019-12-08 LAB — POCT I-STAT 7, (LYTES, BLD GAS, ICA,H+H)
Acid-Base Excess: 1 mmol/L (ref 0.0–2.0)
Bicarbonate: 26.1 mmol/L (ref 20.0–28.0)
Calcium, Ion: 1.36 mmol/L (ref 1.15–1.40)
HCT: 31 % — ABNORMAL LOW (ref 39.0–52.0)
Hemoglobin: 10.5 g/dL — ABNORMAL LOW (ref 13.0–17.0)
O2 Saturation: 97 %
Potassium: 4.3 mmol/L (ref 3.5–5.1)
Sodium: 138 mmol/L (ref 135–145)
TCO2: 27 mmol/L (ref 22–32)
pCO2 arterial: 41.4 mmHg (ref 32.0–48.0)
pH, Arterial: 7.408 (ref 7.350–7.450)
pO2, Arterial: 93 mmHg (ref 83.0–108.0)

## 2019-12-08 LAB — CBC
HCT: 33.2 % — ABNORMAL LOW (ref 39.0–52.0)
Hemoglobin: 10.7 g/dL — ABNORMAL LOW (ref 13.0–17.0)
MCH: 32.9 pg (ref 26.0–34.0)
MCHC: 32.2 g/dL (ref 30.0–36.0)
MCV: 102.2 fL — ABNORMAL HIGH (ref 80.0–100.0)
Platelets: 246 10*3/uL (ref 150–400)
RBC: 3.25 MIL/uL — ABNORMAL LOW (ref 4.22–5.81)
RDW: 13.8 % (ref 11.5–15.5)
WBC: 8.3 10*3/uL (ref 4.0–10.5)
nRBC: 0 % (ref 0.0–0.2)

## 2019-12-08 LAB — POCT I-STAT EG7
Acid-Base Excess: 1 mmol/L (ref 0.0–2.0)
Bicarbonate: 26.8 mmol/L (ref 20.0–28.0)
Calcium, Ion: 1.36 mmol/L (ref 1.15–1.40)
HCT: 32 % — ABNORMAL LOW (ref 39.0–52.0)
Hemoglobin: 10.9 g/dL — ABNORMAL LOW (ref 13.0–17.0)
O2 Saturation: 77 %
Potassium: 4.3 mmol/L (ref 3.5–5.1)
Sodium: 138 mmol/L (ref 135–145)
TCO2: 28 mmol/L (ref 22–32)
pCO2, Ven: 44.5 mmHg (ref 44.0–60.0)
pH, Ven: 7.387 (ref 7.250–7.430)
pO2, Ven: 42 mmHg (ref 32.0–45.0)

## 2019-12-08 LAB — HEPARIN LEVEL (UNFRACTIONATED): Heparin Unfractionated: 0.46 IU/mL (ref 0.30–0.70)

## 2019-12-08 LAB — MAGNESIUM: Magnesium: 2.2 mg/dL (ref 1.7–2.4)

## 2019-12-08 LAB — PROCALCITONIN: Procalcitonin: 2.28 ng/mL

## 2019-12-08 SURGERY — RIGHT/LEFT HEART CATH AND CORONARY ANGIOGRAPHY
Anesthesia: LOCAL

## 2019-12-08 MED ORDER — SODIUM CHLORIDE 0.9% FLUSH
3.0000 mL | Freq: Two times a day (BID) | INTRAVENOUS | Status: DC
  Administered 2019-12-10: 3 mL via INTRAVENOUS

## 2019-12-08 MED ORDER — HYDRALAZINE HCL 20 MG/ML IJ SOLN: 10.0000 mg | INTRAMUSCULAR | Status: AC | PRN

## 2019-12-08 MED ORDER — SODIUM CHLORIDE 0.9% FLUSH
3.0000 mL | INTRAVENOUS | Status: DC | PRN
  Administered 2019-12-09: 3 mL via INTRAVENOUS

## 2019-12-08 MED ORDER — SODIUM CHLORIDE 0.9 % IV SOLN: 250.0000 mL | INTRAVENOUS | Status: DC | PRN

## 2019-12-08 MED ORDER — HEPARIN (PORCINE) IN NACL 1000-0.9 UT/500ML-% IV SOLN
INTRAVENOUS | Status: AC
  Filled 2019-12-08: qty 1000

## 2019-12-08 MED ORDER — FENTANYL CITRATE (PF) 100 MCG/2ML IJ SOLN
INTRAMUSCULAR | Status: AC
  Filled 2019-12-08: qty 2

## 2019-12-08 MED ORDER — MIDAZOLAM HCL 2 MG/2ML IJ SOLN
INTRAMUSCULAR | Status: AC
  Filled 2019-12-08: qty 2

## 2019-12-08 MED ORDER — IOHEXOL 350 MG/ML SOLN
INTRAVENOUS | Status: DC | PRN
  Administered 2019-12-08: 50 mL

## 2019-12-08 MED ORDER — FENTANYL CITRATE (PF) 100 MCG/2ML IJ SOLN
INTRAMUSCULAR | Status: DC | PRN
Start: 2019-12-08 — End: 2019-12-08
  Administered 2019-12-08: 25 ug via INTRAVENOUS

## 2019-12-08 MED ORDER — HEPARIN (PORCINE) IN NACL 1000-0.9 UT/500ML-% IV SOLN
INTRAVENOUS | Status: DC | PRN
  Administered 2019-12-08 (×2): 500 mL

## 2019-12-08 MED ORDER — LIDOCAINE HCL (PF) 1 % IJ SOLN
INTRAMUSCULAR | Status: AC
  Filled 2019-12-08: qty 30

## 2019-12-08 MED ORDER — MIDAZOLAM HCL 2 MG/2ML IJ SOLN
INTRAMUSCULAR | Status: DC | PRN
  Administered 2019-12-08: 1 mg via INTRAVENOUS

## 2019-12-08 MED ORDER — LIDOCAINE HCL (PF) 1 % IJ SOLN
INTRAMUSCULAR | Status: DC | PRN
  Administered 2019-12-08: 15 mL

## 2019-12-08 SURGICAL SUPPLY — 10 items
CATH INFINITI 5FR MULTPACK ANG (CATHETERS) ×1 IMPLANT
CATH SWAN GANZ 7F STRAIGHT (CATHETERS) ×1 IMPLANT
CLOSURE MYNX CONTROL 5F (Vascular Products) ×1 IMPLANT
KIT HEART LEFT (KITS) ×2 IMPLANT
PACK CARDIAC CATHETERIZATION (CUSTOM PROCEDURE TRAY) ×2 IMPLANT
SHEATH PINNACLE 5F 10CM (SHEATH) ×1 IMPLANT
SHEATH PINNACLE 7F 10CM (SHEATH) ×1 IMPLANT
TRANSDUCER W/STOPCOCK (MISCELLANEOUS) ×3 IMPLANT
TUBING ART PRESS 72  MALE/MALE (TUBING) ×1 IMPLANT
WIRE EMERALD 3MM-J .035X150CM (WIRE) ×1 IMPLANT

## 2019-12-08 NOTE — Plan of Care (Signed)
  Problem: Activity: Goal: Ability to return to baseline activity level will improve Outcome: Progressing   

## 2019-12-08 NOTE — Progress Notes (Signed)
[place] for Heparin Indication: atrial fibrillation  Allergies  Allergen Reactions  . Diphenhydramine Hcl Palpitations    Patient Measurements: Height: 5\' 3"  (160 cm) Weight: 63.8 kg (140 lb 10.5 oz) IBW/kg (Calculated) : 56.9 Heparin Dosing Weight: 67.1 kg  Vital Signs: Temp: 97.9 F (36.6 C) (09/14 0439) Temp Source: Oral (09/14 0439) BP: 124/61 (09/14 1040) Pulse Rate: 83 (09/14 1040)  Labs: Recent Labs    12/06/19 0751 12/06/19 1809 12/07/19 0240 12/07/19 0240 12/07/19 1336 12/08/19 0601 12/08/19 0821  HGB 11.2*  --  10.4*   < >  --  10.7* 10.9*  HCT 34.4*  --  31.4*  --   --  33.2* 32.0*  PLT 280  --  264  --   --  246  --   HEPARINUNFRC  --    < > 0.46  --  0.21* 0.46  --   CREATININE 5.14*  --  7.00*  --   --  5.37*  --    < > = values in this interval not displayed.   Assessment:  59 yr old male to begin IV heparin for atrial fibrillation. Hx PAF but no anticoagulation PTA due to hx bleeding episodes from HD cath site while on Warfarin in the past.  Has been on SQ heparin for VTE prophylaxis.   Patient now s/p Cath. Per floor RN, Cardiology wishes to hold heparin for now and will reevaluate in AM for restart.   AM heparin level therapeutic at 0.46.  Goal of Therapy:  Heparin level 0.3-0.7 units/ml Monitor platelets by anticoagulation protocol: Yes   Plan:  Hold heparin for now per Cards F/U restart of heparin in AM   Tisha Cline A. Levada Dy, PharmD, BCPS, FNKF Clinical Pharmacist [place] Please utilize Amion for appropriate phone number to reach the unit pharmacist ([place])   12/08/2019

## 2019-12-08 NOTE — Progress Notes (Signed)
PROGRESS NOTE    Peter Sloan  QIW:[id number] DOB: 05-19-1960 DOA: 12/03/2019 PCP: Mauricia Area, MD   Brief Narrative: 59 year old with past medical history significant for ESRD on hemodialysis, cardiomyopathy with ejection fraction 30 to 35%, seizure disorder, paroxysmal atrial fibrillation not on anticoagulation, presents complaining of worsening shortness of breath and cough for the last 1 or 2 weeks.  He report productive cough.  Evaluation in the ED patient was found to be afebrile, hypoxic 85 on room air, tachypneic, chest x-ray with widespread bilateral groundglass airspace disease suggestive of multifocal pneumonia or edema.  Elevated BNP, mild elevation of troponin.  Covid PCR negative.  Patient develops A. Fib RVR 9/12. Started on heparin Gtt.   Assessment & Plan:   Principal Problem:   Acute respiratory failure with hypoxia (HCC) Active Problems:   Hypokalemia   Seizure disorder (HCC)   End stage renal disease on dialysis [facility])   Atrial fibrillation (HCC)  1-Acute Hypoxic Respiratory Failure secondary to Acute systolic heart failure exacerbation and or pneumonia: -Patient presented with hypoxemia, dyspnea, elevated BNP 4000, chest x-ray asymmetric edema versus pneumonia. Prior EF 35 %. -Nephrology consulted. Had HD 9/10 and 9/11. 9/13 -Received two doses of Azithro, due to prolong QT change azithromycin to Doxy.  -Continue with cefepime. Pro-calcitonin decreased to 2.9 -Continue with  Guaifenesin.  -Flutter valve.  -Chest x ray 9/12: Marked improvement in aeration throughout both lungs since the examination 3 days ago, though patchy ground-glass airspace opacities persist, indicating improving edema versus pneumonia. Residual nodular opacity at the LEFT lung base, likely in the lingula. Needs chest x ray follow up.  -off oxygen, breathing better, no significant cough.  -Day 4/5 antibiotics to cover for PNA.   2-Acute systolic heart failure exacerbation,  paroxysmal A. fib: Cardiology consulted. Echo with lower EF at 20, Severe mitral regurgitation.  Cardiology recommending cath in or out patient. Awaiting improvement of PNA>  Underwent, no significant CAD.   3-Hypokalemia: Resolved.   4-ESRD: He had hemodialysis on 9/9 and 9/11. 9/13--9/14 -Chest x-ray with pulmonary edema versus pneumonia.  BNP elevated.    5-Paroxysmal A. Fib: Cardiology consulted. A fib RVR morning of 9/12. Amiodarone held due to prololng QT 9/12. EKG showed a fib RVR. PRN lopressor ordered.  He converted to sinus rhythm 9-12. A fib 9/13. EKG with Mobitz type , informed Dr Doylene Canard.  Management per cardiology. Plan to hold heparin post cath today.  Left message to Dr Doylene Canard, to discussed anticoagulation option.   6-Mild elevation of troponin: Related to demand ischemia in the setting of heart failure exacerbation. Cardiology following.  7-Seizure disorder: Continue with Keppra and Depakote.  8-Metabolic Bone diseases. Per Nephrology.  On sensipar.   9-Constipation; Sorbitol PRN ordered. No bowel movement. Nurse will give him sorbitol later if needed.  10-Nutrition; nutrition consulted.   Estimated body mass index is 26.13 kg/m as calculated from the following:   Height as of this encounter: 5\' 3"  (1.6 m).   Weight as of this encounter: 66.9 kg.   DVT prophylaxis: Heparin Code Status: Full code Family Communication: Care discussed with patient Disposition Plan:  Status is: Inpatient  Remains inpatient appropriate because:IV treatments appropriate due to intensity of illness or inability to take PO   Dispo: The patient is from: Home              Anticipated d/c is to: Home              Anticipated d/c date is: 3 days  Patient currently is not medically stable to d/c.        Consultants:   Nephrology  Cardiology  Procedures:   HD  ECHO;  Antimicrobials:  Ceftriaxone 9/10 Azithromycin 9/10.  Subjective: He is on  RA, off oxygen. Dyspnea improved. I saw him after cath   Objective: Vitals:   12/08/19 1300 12/08/19 1330 12/08/19 1400 12/08/19 1430  BP: 96/62 128/71 107/69 (!) 97/54  Pulse: (!) 42 (!) 49 (!) 41   Resp:  (!) 26 (!) 33   Temp:      TempSrc:      SpO2:  100% 100%   Weight:      Height:        Intake/Output Summary (Last 24 hours) at 12/08/2019 1445 Last data filed at 12/08/2019 1300 Gross per 24 hour  Intake 1497.05 ml  Output 0 ml  Net 1497.05 ml   Filed Weights   12/07/19 0821 12/07/19 1226 12/08/19 1207  Weight: 66.7 kg 63.8 kg 66.9 kg    Examination:  General exam: NAD Respiratory system: CTA Cardiovascular system: S 1, S 2 IRR Gastrointestinal system: BS present, soft, nt Central nervous system: Alert Extremities: No edema Skin: No rashes   Data Reviewed: I have personally reviewed following labs and imaging studies  CBC: Recent Labs  Lab 12/04/19 0635 12/04/19 0635 12/05/19 0420 12/06/19 0751 12/07/19 0240 12/08/19 0601 12/08/19 0821  WBC 10.5  --  9.5 8.7 8.0 8.3  --   HGB 10.8*   < > 10.5* 11.2* 10.4* 10.7* 10.9*  HCT 33.3*   < > 32.4* 34.4* 31.4* 33.2* 32.0*  MCV 102.1*  --  101.6* 100.3* 101.6* 102.2*  --   PLT 280  --  240 280 264 246  --    < > = values in this interval not displayed.   Basic Metabolic Panel: Recent Labs  Lab 12/04/19 0635 12/04/19 0635 12/05/19 0420 12/05/19 1305 12/06/19 0751 12/07/19 0240 12/08/19 0601 12/08/19 0821 12/08/19 1100  NA 137   < > 139  --  138 139 139 138  --   K 4.4   < > 3.9  --  3.2* 3.9 4.3 4.3  --   CL 93*  --  97*  --  98 101 104  --   --   CO2 29  --  25  --  25 23 22   --   --   GLUCOSE 113*  --  99  --  140* 93 87  --   --   BUN 23*  --  27*  --  32* 52* 35*  --   --   CREATININE 5.44*  --  4.92*  --  5.14* 7.00* 5.37*  --   --   CALCIUM 9.0  --  9.4  --  10.0 10.5* 10.1  --   --   MG 2.1  --   --   --  2.2  --   --   --  2.2  PHOS  --   --   --  5.7*  --   --   --   --   --    < > =  values in this interval not displayed.   GFR: Estimated Creatinine Clearance: 12.2 mL/min (A) (by C-G formula based on SCr of 5.37 mg/dL (H)). Liver Function Tests: Recent Labs  Lab 12/07/19 1336  ALBUMIN 2.6*   No results for input(s): LIPASE, AMYLASE in the last 168 hours. No results for input(s):  AMMONIA in the last 168 hours. Coagulation Profile: No results for input(s): INR, PROTIME in the last 168 hours. Cardiac Enzymes: No results for input(s): CKTOTAL, CKMB, CKMBINDEX, TROPONINI in the last 168 hours. BNP (last 3 results) No results for input(s): PROBNP in the last 8760 hours. HbA1C: No results for input(s): HGBA1C in the last 72 hours. CBG: No results for input(s): GLUCAP in the last 168 hours. Lipid Profile: No results for input(s): CHOL, HDL, LDLCALC, TRIG, CHOLHDL, LDLDIRECT in the last 72 hours. Thyroid Function Tests: No results for input(s): TSH, T4TOTAL, FREET4, T3FREE, THYROIDAB in the last 72 hours. Anemia Panel: No results for input(s): VITAMINB12, FOLATE, FERRITIN, TIBC, IRON, RETICCTPCT in the last 72 hours. Sepsis Labs: Recent Labs  Lab 12/04/19 0635 12/05/19 0420 12/07/19 1336 12/08/19 0601  PROCALCITON 1.88 3.20 2.97 2.28    Recent Results (from the past 240 hour(s))  SARS Coronavirus 2 by RT PCR (hospital order, performed in [facility] hospital lab) Nasopharyngeal Nasopharyngeal Swab     Status: None   Collection Time: 12/03/19  4:54 PM   Specimen: Nasopharyngeal Swab  Result Value Ref Range Status   SARS Coronavirus 2 NEGATIVE NEGATIVE Final    Comment: (NOTE) SARS-CoV-2 target nucleic acids are NOT DETECTED.  The SARS-CoV-2 RNA is generally detectable in upper and lower respiratory specimens during the acute phase of infection. The lowest concentration of SARS-CoV-2 viral copies this assay can detect is 250 copies / mL. A negative result does not preclude SARS-CoV-2 infection and should not be used as the sole basis for treatment or  other patient management decisions.  A negative result may occur with improper specimen collection / handling, submission of specimen other than nasopharyngeal swab, presence of viral mutation(s) within the areas targeted by this assay, and inadequate number of viral copies (<250 copies / mL). A negative result must be combined with clinical observations, patient history, and epidemiological information.  Fact Sheet for Patients:   StrictlyIdeas.no  Fact Sheet for Healthcare Providers: BankingDealers.co.za  This test is not yet approved or  cleared by the [place] FDA and has been authorized for detection and/or diagnosis of SARS-CoV-2 by FDA under an Emergency Use Authorization (EUA).  This EUA will remain in effect (meaning this test can be used) for the duration of the COVID-19 declaration under Section 564(b)(1) of the Act, 21 U.S.C. section 360bbb-3(b)(1), unless the authorization is terminated or revoked sooner.  Performed at [facility], [place] [address] [place], [place] [address]   Culture, sputum-assessment     Status: None   Collection Time: 12/04/19 12:10 PM   Specimen: Expectorated Sputum  Result Value Ref Range Status   Specimen Description EXPECTORATED SPUTUM  Final   Special Requests NONE  Final   Sputum evaluation   Final    Sputum specimen not acceptable for testing.  Please recollect.   J,FOKUO RN @[phone]  12/04/19 EB Performed at [place] [address] [place], [place] [address]    Report Status 12/04/2019 FINAL  Final  MRSA PCR Screening     Status: None   Collection Time: 12/04/19 11:43 PM   Specimen: Nasopharyngeal  Result Value Ref Range Status   MRSA by PCR NEGATIVE NEGATIVE Final    Comment:        The GeneXpert MRSA Assay (FDA approved for NASAL specimens only), is one component of a comprehensive MRSA colonization surveillance program. It is not intended to diagnose  MRSA infection nor to guide or monitor treatment  for MRSA infections. Performed at [facility], [place] [address] [place], [place] [address]          Radiology Studies: CARDIAC CATHETERIZATION  Result Date: 12/08/2019  Prox RCA to Mid RCA lesion is 25% stenosed.  Prox LAD to Mid LAD lesion is 25% stenosed.  1st Diag lesion is 30% stenosed.  LV end diastolic pressure is normal.  Medical treatment.        Scheduled Meds:  allopurinol  300 mg Oral Daily   amiodarone  200 mg Oral Daily   aspirin EC  81 mg Oral Daily   Chlorhexidine Gluconate Cloth  6 each Topical Q0600   cinacalcet  180 mg Oral Q T,Th,Sa-HD   divalproex  250 mg Oral QHS   feeding supplement (ENSURE ENLIVE)  237 mL Oral BID BM   guaiFENesin  600 mg Oral BID   lamoTRIgine  25 mg Oral BID   levETIRAcetam  500 mg Oral BID   metoprolol tartrate  12.5 mg Oral QID   multivitamin  1 tablet Oral QHS   sodium chloride flush  3 mL Intravenous Q12H   sodium chloride flush  3 mL Intravenous Q12H   sodium chloride flush  3 mL Intravenous Q12H   Continuous Infusions:  sodium chloride 250 mL (12/07/19 2132)   sodium chloride     ceFEPime (MAXIPIME) IV 1 g (12/07/19 1826)   doxycycline (VIBRAMYCIN) IV Stopped (12/08/19 1148)     LOS: 4 days    Time spent: 35 minutes.     Elmarie Shiley, MD [facility]   If 7PM-7AM, please contact night-coverage [email or web address]  12/08/2019, 2:45 PM

## 2019-12-08 NOTE — Progress Notes (Signed)
Pharmacy Antibiotic Note  Peter Sloan is a 59 y.o. male with ESRD, rising procalcitonin and PNA .  Pharmacy has been consulted for Cefepime dosing.  Plan: Continue Cefepime 1 g IV q24h F/U HD schedule  Height: 5\' 3"  (160 cm) Weight: 63.8 kg (140 lb 10.5 oz) IBW/kg (Calculated) : 56.9  Temp (24hrs), Avg:98.1 F (36.7 C), Min:97.7 F (36.5 C), Max:98.8 F (37.1 C)  Recent Labs  Lab 12/04/19 0635 12/05/19 0420 12/06/19 0751 12/07/19 0240 12/08/19 0601  WBC 10.5 9.5 8.7 8.0 8.3  CREATININE 5.44* 4.92* 5.14* 7.00* 5.37*    Estimated Creatinine Clearance: 12.2 mL/min (A) (by C-G formula based on SCr of 5.37 mg/dL (H)).    Allergies  Allergen Reactions  . Diphenhydramine Hcl Palpitations     Levin Dagostino A. Levada Dy, PharmD, BCPS, FNKF Clinical Pharmacist [place] Please utilize Amion for appropriate phone number to reach the unit pharmacist ([place])   12/08/2019 11:58 AM

## 2019-12-08 NOTE — Progress Notes (Signed)
Peter Canard, MD gave verbal orders to hold heparin drip for today and potentially resume tomorrow. Orders followed. Will continue to monitor R groin site post cath for any signs of bleeding and/or bruising.

## 2019-12-08 NOTE — Interval H&P Note (Signed)
History and Physical Interval Note:  12/08/2019 7:48 AM  Peter Sloan  has presented today for surgery, with the diagnosis of Heart failure.  The various methods of treatment have been discussed with the patient and family. After consideration of risks, benefits and other options for treatment, the patient has consented to  Procedure(s): RIGHT/LEFT HEART CATH AND CORONARY ANGIOGRAPHY (N/A) as a surgical intervention.  The patient's history has been reviewed, patient examined, no change in status, stable for surgery.  I have reviewed the patient's chart and labs.  Questions were answered to the patient's satisfaction.     Birdie Riddle

## 2019-12-08 NOTE — Progress Notes (Signed)
OT Cancellation Note  Patient Details Name: Peter Sloan MRN: [id number] DOB: 04-21-1960   Cancelled Treatment:    Reason Eval/Treat Not Completed: (P) Patient at procedure or test/ unavailable (cardiac cath)  [facility] 12/08/2019, 8:00 AM  Maurie Boettcher, OT/L   Acute OT Clinical Specialist Acute Rehabilitation Services Pager [phone] Office [phone]

## 2019-12-08 NOTE — Progress Notes (Signed)
[facility] Progress Note   Subjective:   Had extra HD yesterday with net UF 2.5L. Pt seen post cardiac cath today. O2 sat now stable on RA. Denies SOB, CP, palpitations, abdominal pain, N/V/D.   Objective Vitals:   12/08/19 0955 12/08/19 1010 12/08/19 1025 12/08/19 1040  BP: 114/61 (!) 112/57 118/76 124/61  Pulse: (!) 44 (!) 44 (!) 34 83  Resp: (!) 24 (!) 24 (!) 24 (!) 26  Temp:      TempSrc:      SpO2: 97% 96% 97% 98%  Weight:      Height:       Physical Exam General: Well developed, alert, in NAD Heart: RRR No murmurs, rubs or gallops Lungs: CTA bilaterally without wheezing, rhonchi or rales Abdomen: Soft, non-tender, non-distended, +BS Extremities: No edema b/l lower extremities  Dialysis Access: LUE AVF+ bruit   Additional Objective Labs: Basic Metabolic Panel: Recent Labs  Lab 12/05/19 0420 12/05/19 1305 12/06/19 0751 12/07/19 0240 12/08/19 0601  NA   < >  --  138 139 139  K   < >  --  3.2* 3.9 4.3  CL   < >  --  98 101 104  CO2   < >  --  25 23 22   GLUCOSE   < >  --  140* 93 87  BUN   < >  --  32* 52* 35*  CREATININE   < >  --  5.14* 7.00* 5.37*  CALCIUM   < >  --  10.0 10.5* 10.1  PHOS  --  5.7*  --   --   --    < > = values in this interval not displayed.   Liver Function Tests: Recent Labs  Lab 12/07/19 1336  ALBUMIN 2.6*   CBC: Recent Labs  Lab 12/04/19 0635 12/04/19 0635 12/05/19 0420 12/05/19 0420 12/06/19 0751 12/07/19 0240 12/08/19 0601  WBC 10.5   < > 9.5   < > 8.7 8.0 8.3  HGB 10.8*   < > 10.5*   < > 11.2* 10.4* 10.7*  HCT 33.3*   < > 32.4*   < > 34.4* 31.4* 33.2*  MCV 102.1*  --  101.6*  --  100.3* 101.6* 102.2*  PLT 280   < > 240   < > 280 264 246   < > = values in this interval not displayed.   Blood Culture    Component Value Date/Time   SDES EXPECTORATED SPUTUM 12/04/2019 1210   SPECREQUEST NONE 12/04/2019 1210   REPTSTATUS 12/04/2019 FINAL 12/04/2019 1210    Cardiac Enzymes: No results for  input(s): CKTOTAL, CKMB, CKMBINDEX, TROPONINI in the last 168 hours. CBG: No results for input(s): GLUCAP in the last 168 hours. Iron Studies: No results for input(s): IRON, TIBC, TRANSFERRIN, FERRITIN in the last 72 hours. @lablastinr3 @ Studies/Results: CARDIAC CATHETERIZATION  Result Date: 12/08/2019  Prox RCA to Mid RCA lesion is 25% stenosed.  Prox LAD to Mid LAD lesion is 25% stenosed.  1st Diag lesion is 30% stenosed.  LV end diastolic pressure is normal.  Medical treatment.   Medications: . sodium chloride 250 mL (12/07/19 2132)  . ceFEPime (MAXIPIME) IV 1 g (12/07/19 1826)  . doxycycline (VIBRAMYCIN) IV 100 mg (12/07/19 2138)  . heparin Stopped (12/08/19 0800)   . allopurinol  300 mg Oral Daily  . amiodarone  200 mg Oral Daily  . aspirin EC  81 mg Oral Daily  . Chlorhexidine Gluconate Cloth  6 each  Topical Q0600  . cinacalcet  180 mg Oral Q T,Th,Sa-HD  . divalproex  250 mg Oral QHS  . feeding supplement (ENSURE ENLIVE)  237 mL Oral BID BM  . guaiFENesin  600 mg Oral BID  . lamoTRIgine  25 mg Oral BID  . levETIRAcetam  500 mg Oral BID  . metoprolol tartrate  12.5 mg Oral QID  . multivitamin  1 tablet Oral QHS  . sodium chloride flush  3 mL Intravenous Q12H  . sodium chloride flush  3 mL Intravenous Q12H    Dialysis Orders: [facility] T,Th,S 4 hours 180 NRe 400/800 69.5 kg 2.0 K/ 2.0 Ca AVG -Heparin 6800 units IV TIW -Sensipar 180 mg PO TIW -Calcitriol 1.0 mcg PO TIW (last PTH 744 11/05/2019)  Assessment/Plan: 1. Pulmonary edema/Volume overload: Has been leaving under EDW at OP center, H/O HFrEF. CXR significantly improved with serial HD. S/p cardiac cath 9/14.  Now off supplemental O2, weight down 5.7kg. Plan for short HD today to get back on schedule.  2. A. Fib- Per primary/cardiology. On heparin drip, amiodarone and metoprolol. Keep potassium >4, using high K+ bath with HD. 3. Possible PNA-ABX per primary- doxycycline and cefepime. 4. ESRD -usual HD TTS.  Serial HD this admission for volume. Next HD today, resume TTS schedule 5. Hypertension/volume - As noted above.BP is controlled (on metoprolol for a.fib). Lower UF goal to 1-1.5L today.  6. Anemia - HGB 10.7.  No ESA needed. 7. Metabolic bone disease -Chronic issues with hyperphosphatemia as OP.Corrected calcium 11.2. No contributory meds on list. Hold calcitriol, continue sensipar. Last PTH was 664 on 9/9, may need to increase sensipar dose. Phos close to goal- 5.7.  8. Nutrition - Renal diet with fluid restrictions.  8. H/O Afib. Not on anticoagulation. On amiodarone, metoprolol.  9. H/O HFrEF. Repeat ECHOEF down 20-25% (was 30-35%).Dr. Audria Nine, PA-C 12/08/2019, 11:23 AM  [facility] Pager: [phone]

## 2019-12-08 NOTE — Progress Notes (Signed)
OT Cancellation Note  Patient Details Name: Brysun Eschmann MRN: [id number] DOB: 13-Dec-1960   Cancelled Treatment:    Reason Eval/Treat Not Completed: Patient at procedure or test/ unavailable (hemodialysis). OT will continue efforts toward completion of evaluation when patient is available.   Gloris Manchester OTR/L Supplemental OT, Department of rehab services [phone]  Chazlyn Cude R H. 12/08/2019, 1:18 PM

## 2019-12-09 LAB — CBC
HCT: 33.7 % — ABNORMAL LOW (ref 39.0–52.0)
Hemoglobin: 10.9 g/dL — ABNORMAL LOW (ref 13.0–17.0)
MCH: 33.3 pg (ref 26.0–34.0)
MCHC: 32.3 g/dL (ref 30.0–36.0)
MCV: 103.1 fL — ABNORMAL HIGH (ref 80.0–100.0)
Platelets: 280 10*3/uL (ref 150–400)
RBC: 3.27 MIL/uL — ABNORMAL LOW (ref 4.22–5.81)
RDW: 13.9 % (ref 11.5–15.5)
WBC: 10.3 10*3/uL (ref 4.0–10.5)
nRBC: 0 % (ref 0.0–0.2)

## 2019-12-09 LAB — HEPARIN LEVEL (UNFRACTIONATED)
Heparin Unfractionated: <0.1 IU/mL — ABNORMAL LOW (ref 0.30–0.70)
Heparin Unfractionated: <0.1 IU/mL — ABNORMAL LOW (ref 0.30–0.70)

## 2019-12-09 LAB — PROCALCITONIN: Procalcitonin: 2 ng/mL

## 2019-12-09 MED ORDER — CEFDINIR 300 MG PO CAPS
300.0000 mg | ORAL_CAPSULE | ORAL | Status: DC
  Administered 2019-12-09 – 2019-12-11 (×3): 300 mg via ORAL
  Filled 2019-12-09 (×3): qty 1

## 2019-12-09 MED ORDER — HEPARIN (PORCINE) 25000 UT/250ML-% IV SOLN
1250.0000 [IU]/h | INTRAVENOUS | Status: DC
  Administered 2019-12-09: 1150 [IU]/h via INTRAVENOUS
  Administered 2019-12-10: 1300 [IU]/h via INTRAVENOUS
  Filled 2019-12-09 (×3): qty 250

## 2019-12-09 MED ORDER — WARFARIN - PHARMACIST DOSING INPATIENT: Freq: Every day | Status: DC

## 2019-12-09 MED ORDER — SODIUM CHLORIDE 0.9 % IV SOLN: 1.0000 g | INTRAVENOUS | Status: DC

## 2019-12-09 MED ORDER — CHLORHEXIDINE GLUCONATE CLOTH 2 % EX PADS: 6.0000 | MEDICATED_PAD | Freq: Every day | CUTANEOUS | Status: DC

## 2019-12-09 MED ORDER — WARFARIN - PHYSICIAN DOSING INPATIENT: Freq: Every day | Status: DC

## 2019-12-09 MED ORDER — WARFARIN SODIUM 2 MG PO TABS
2.0000 mg | ORAL_TABLET | Freq: Every day | ORAL | Status: DC
  Administered 2019-12-09: 2 mg via ORAL
  Filled 2019-12-09 (×2): qty 1

## 2019-12-09 NOTE — Evaluation (Signed)
Occupational Therapy Evaluation Patient Details Name: Peter Sloan MRN: [id number] DOB: 1960/12/16 Today's Date: 12/09/2019    History of Present Illness The pt is a 59 yo male presenting with SOB and hypoxic (85% on RA) at rest. Echo revealed EF of 20-25%, severe mitral regurgitation with plan for heart cath in future. S/P R/L heart cath 9/14. PMH includes: HTN, HLD, HF, renal insufficiency, ESRD on HD TTS, and afib.   Clinical Impression   PTA patient independent and driving. Admitted for above and presenting near baseline for ADLs and functional in room mobility, at supervision level. SpO2 maintained during session on RA, HR max 111. He reports he will have family support as needed at dc. Reviewed energy conservation techniques and safety for dc home.  Based on performance today, no further OT needs have been identified. OT will sign off.      Follow Up Recommendations  No OT follow up;Supervision - Intermittent    Equipment Recommendations  None recommended by OT    Recommendations for Other Services       Precautions / Restrictions Precautions Precautions: Fall Restrictions Weight Bearing Restrictions: No      Mobility Bed Mobility Overal bed mobility: Modified Independent             General bed mobility comments: no assist required, HOB elevated  Transfers Overall transfer level: Needs assistance Equipment used: None Transfers: Sit to/from Stand Sit to Stand: Supervision         General transfer comment: for safety, able to transition from low commode with supervision     Balance Overall balance assessment: Needs assistance Sitting-balance support: No upper extremity supported;Feet supported Sitting balance-Leahy Scale: Good     Standing balance support: No upper extremity supported;During functional activity Standing balance-Leahy Scale: Fair Standing balance comment: no UE support required during simulated LB bathing or ambulation                            ADL either performed or assessed with clinical judgement   ADL Overall ADL's : Needs assistance/impaired     Grooming: Wash/dry hands;Modified independent;Standing   Upper Body Bathing: Supervision/ safety;Sitting   Lower Body Bathing: Supervison/ safety;Sit to/from stand   Upper Body Dressing : Supervision/safety;Sitting   Lower Body Dressing: Supervision/safety;Sit to/from stand   Toilet Transfer: Supervision/safety;Ambulation       Tub/ Shower Transfer: Tub transfer;Supervision/safety;Ambulation Tub/Shower Transfer Details (indicate cue type and reason): simulated in room  Functional mobility during ADLs: Supervision/safety General ADL Comments: patient completing ADLs and transfers in room with supervision, no LOB and SpO2 maintained throughout session     Vision   Vision Assessment?: No apparent visual deficits     Perception     Praxis      Pertinent Vitals/Pain Pain Assessment: No/denies pain     Hand Dominance Right   Extremity/Trunk Assessment Upper Extremity Assessment Upper Extremity Assessment: Overall WFL for tasks assessed   Lower Extremity Assessment Lower Extremity Assessment: Defer to PT evaluation   Cervical / Trunk Assessment Cervical / Trunk Assessment: Normal   Communication Communication Communication: No difficulties   Cognition Arousal/Alertness: Awake/alert Behavior During Therapy: WFL for tasks assessed/performed Overall Cognitive Status: Within Functional Limits for tasks assessed                                 General Comments: appears [facility]   General Comments  SpO2 on RA [facility]    Exercises     Shoulder Instructions      Home Living Family/patient expects to be discharged to:: Private residence Living Arrangements: Spouse/significant other;Children Available Help at Discharge: Family;Available 24 hours/day Type of Home: Apartment Home Access: Stairs to enter [employer]  of Steps: 15 Entrance Stairs-Rails: Left;Right Home Layout: One level     Bathroom Shower/Tub: Teacher, early years/pre: Standard     Home Equipment: Shower seat;Grab bars - tub/shower;Hand held shower head   Additional Comments: pt reports he does not have any equipment, but his brother has everything (RW, cane, BSC) and no longer needs it.; reports plan to go to mothers home initally (ranch style)      Prior Functioning/Environment Level of Independence: Independent        Comments: pt reports independent with mobility, independent with ADLs, light IADLs and driving         OT Problem List: Decreased activity tolerance;Impaired balance (sitting and/or standing)      OT Treatment/Interventions:      OT Goals(Current goals can be found in the care plan section) Acute Rehab OT Goals Patient Stated Goal: return home, get stronger OT Goal Formulation: With patient  OT Frequency:     Barriers to D/C:            Co-evaluation              AM-PAC OT "6 Clicks" Daily Activity     Outcome Measure Help from another person eating meals?: None Help from another person taking care of personal grooming?: None Help from another person toileting, which includes using toliet, bedpan, or urinal?: A Little Help from another person bathing (including washing, rinsing, drying)?: A Little Help from another person to put on and taking off regular upper body clothing?: None Help from another person to put on and taking off regular lower body clothing?: A Little 6 Click Score: 21   End of Session Nurse Communication: Mobility status  Activity Tolerance: Patient tolerated treatment well Patient left: in bed;with call bell/phone within reach  OT Visit Diagnosis: Other abnormalities of gait and mobility (R26.89)                Time: 9622-2979 OT Time Calculation (min): 19 min Charges:  OT General Charges $OT Visit: 1 Visit OT Evaluation $OT Eval Low Complexity: 1  Low  Jolaine Artist, OT Acute Rehabilitation Services Pager [phone] Office [phone]   Delight Stare 12/09/2019, 9:19 AM

## 2019-12-09 NOTE — Plan of Care (Signed)
  Problem: Education: Goal: Knowledge of General Education information will improve Description Including pain rating scale, medication(s)/side effects and non-pharmacologic comfort measures Outcome: Progressing   Problem: Health Behavior/Discharge Planning: Goal: Ability to manage health-related needs will improve Outcome: Progressing   

## 2019-12-09 NOTE — Progress Notes (Addendum)
PROGRESS NOTE    Peter Sloan  HEN:[id number] DOB: Apr 02, 1960 DOA: 12/03/2019 PCP: Mauricia Area, MD   Brief Narrative: 59 year old with past medical history significant for ESRD on hemodialysis, cardiomyopathy with ejection fraction 30 to 35%, seizure disorder, paroxysmal atrial fibrillation not on anticoagulation, presented to ED w/ worsening shortness of breath and cough for the last 1 or 2 weeks, productive cough. -in ED, hypoxic-sats were 85 on room air, tachypneic, chest x-ray with widespread bilateral groundglass airspace disease suggestive of multifocal pneumonia or edema.  Elevated BNP, mild elevation of troponin.  Covid PCR negative. -Seen by cardiology this admission, hospital course complicated by A. fib RVR, started on a heparin drip -Also underwent left heart catheterization which noted nonobstructive coronary disease  Assessment & Plan:  Acute Hypoxic Respiratory Failure secondary to Acute systolic heart failure exacerbation and pneumonia: -Patient presented with hypoxemia, dyspnea, elevated BNP 4000, chest x-ray asymmetric edema versus pneumonia. Prior EF 35 %. -Nephrology consulted, hemodialysis resumed -Procalcitonin was greater than 3, initially treated with azithromycin was then changed to doxycycline due to prolonged QTC  -Also on cefepime, day 5 today, changed to oral cefdinir for 2 more days to complete 7-day course -Continue flutter valve, incentive spirometry -Chest x ray 9/12: Marked improvement in aeration throughout both lungs since the examination 3 days ago, though patchy ground-glass airspace opacities persist, indicating improving edema versus pneumonia. -Weaned off O2  Acute systolic heart failure exacerbation, paroxysmal A. fib:  -Cardiology consulted. Echo with lower EF at 20, Severe mitral regurgitation.  -Underwent left heart cath 9/14, nonobstructive CAD -Volume managed with HD  Hypokalemia: Resolved.   ESRD: -Nephrology following,  dialysis continued  Paroxysmal A. Fib: -Cardiology consulted. -A fib RVR morning of 9/12,  -Now in sinus rhythm -Continue low-dose oral amiodarone and metoprolol -Remains on IV heparin, starting Coumadin for anticoagulation, will discuss with pharmacy, needs follow-up in the Coumadin clinic as well  Mild elevation of troponin: Related to demand ischemia in the setting of heart failure exacerbation. -Left heart cath with nonobstructive CAD, stop aspirin in the setting of heparin and warfarin at this time  Seizure disorder: Continue with Keppra and Depakote.  Constipation; Sorbitol PRN ordered. No bowel movement. Nurse will give him sorbitol later if needed.   Moderate protein calorie malnutrition -Supplements ordered  Nodular opacity left base -Noted on x-ray, needs follow-up  Estimated body mass index is 26.2 kg/m as calculated from the following:   Height as of this encounter: 5\' 3"  (1.6 m).   Weight as of this encounter: 67.1 kg.   DVT prophylaxis: Heparin IV and Coumadin Code Status: Full code Family Communication: Care discussed with patient Disposition Plan:  Status is: Inpatient  Remains inpatient appropriate because:IV treatments appropriate due to intensity of illness or inability to take PO   Dispo: The patient is from: Home              Anticipated d/c is to: Home              Anticipated d/c date is: Likely 48 hours, on heparin and starting Coumadin for A. fib              Patient currently is not medically stable to d/c.  Consultants:   Nephrology  Cardiology  Procedures:   HD  ECHO;  Antimicrobials:  Ceftriaxone 9/10 Azithromycin 9/10.  Subjective: -Feeling better, breathing improving, mild cough and congestion  Objective: Vitals:   12/08/19 1555 12/08/19 2106 12/09/19 0512 12/09/19 0950  BP: (!) 103/51  107/77 118/81 112/72  Pulse: (!) 51 94 91 96  Resp: 18 18 17 18   Temp: 98.4 F (36.9 C) 97.7 F (36.5 C) 97.8 F (36.6 C) 98.1 F  (36.7 C)  TempSrc:  Oral Oral Oral  SpO2: 94% 93% 93% 96%  Weight:   67.1 kg   Height:        Intake/Output Summary (Last 24 hours) at 12/09/2019 1208 Last data filed at 12/09/2019 0745 Gross per 24 hour  Intake 1176.95 ml  Output 982 ml  Net 194.95 ml   Filed Weights   12/08/19 1207 12/08/19 1518 12/09/19 0512  Weight: 66.9 kg 65.6 kg 67.1 kg    Examination:  Gen: Thinly built pleasant male sitting up in bed, AAOx3, no distress HEENT: Neck supple, no JVD Lungs: Decreased breath sounds the bases, few basilar rales CVS: S1-S2, regular rate rhythm, systolic murmur noted Abd: soft, Non tender, non distended, BS present Extremities: No edema, right arm AV fistula Skin: no new rashes on exposed skin   Data Reviewed: I have personally reviewed following labs and imaging studies  CBC: Recent Labs  Lab 12/05/19 0420 12/05/19 0420 12/06/19 0751 12/06/19 0751 12/07/19 0240 12/08/19 0601 12/08/19 0821 12/08/19 0822 12/09/19 0456  WBC 9.5  --  8.7  --  8.0 8.3  --   --  10.3  HGB 10.5*   < > 11.2*   < > 10.4* 10.7* 10.9* 10.5* 10.9*  HCT 32.4*   < > 34.4*   < > 31.4* 33.2* 32.0* 31.0* 33.7*  MCV 101.6*  --  100.3*  --  101.6* 102.2*  --   --  103.1*  PLT 240  --  280  --  264 246  --   --  280   < > = values in this interval not displayed.   Basic Metabolic Panel: Recent Labs  Lab 12/04/19 0635 12/04/19 0635 12/05/19 0420 12/05/19 0420 12/05/19 1305 12/06/19 0751 12/07/19 0240 12/08/19 0601 12/08/19 0821 12/08/19 0822 12/08/19 1100  NA 137   < > 139   < >  --  138 139 139 138 138  --   K 4.4   < > 3.9   < >  --  3.2* 3.9 4.3 4.3 4.3  --   CL 93*  --  97*  --   --  98 101 104  --   --   --   CO2 29  --  25  --   --  25 23 22   --   --   --   GLUCOSE 113*  --  99  --   --  140* 93 87  --   --   --   BUN 23*  --  27*  --   --  32* 52* 35*  --   --   --   CREATININE 5.44*  --  4.92*  --   --  5.14* 7.00* 5.37*  --   --   --   CALCIUM 9.0  --  9.4  --   --  10.0  10.5* 10.1  --   --   --   MG 2.1  --   --   --   --  2.2  --   --   --   --  2.2  PHOS  --   --   --   --  5.7*  --   --   --   --   --   --    < > =  values in this interval not displayed.   GFR: Estimated Creatinine Clearance: 12.2 mL/min (A) (by C-G formula based on SCr of 5.37 mg/dL (H)). Liver Function Tests: Recent Labs  Lab 12/07/19 1336  ALBUMIN 2.6*   No results for input(s): LIPASE, AMYLASE in the last 168 hours. No results for input(s): AMMONIA in the last 168 hours. Coagulation Profile: No results for input(s): INR, PROTIME in the last 168 hours. Cardiac Enzymes: No results for input(s): CKTOTAL, CKMB, CKMBINDEX, TROPONINI in the last 168 hours. BNP (last 3 results) No results for input(s): PROBNP in the last 8760 hours. HbA1C: No results for input(s): HGBA1C in the last 72 hours. CBG: No results for input(s): GLUCAP in the last 168 hours. Lipid Profile: No results for input(s): CHOL, HDL, LDLCALC, TRIG, CHOLHDL, LDLDIRECT in the last 72 hours. Thyroid Function Tests: No results for input(s): TSH, T4TOTAL, FREET4, T3FREE, THYROIDAB in the last 72 hours. Anemia Panel: No results for input(s): VITAMINB12, FOLATE, FERRITIN, TIBC, IRON, RETICCTPCT in the last 72 hours. Sepsis Labs: Recent Labs  Lab 12/05/19 0420 12/07/19 1336 12/08/19 0601 12/09/19 0456  PROCALCITON 3.20 2.97 2.28 2.00    Recent Results (from the past 240 hour(s))  SARS Coronavirus 2 by RT PCR (hospital order, performed in [facility] hospital lab) Nasopharyngeal Nasopharyngeal Swab     Status: None   Collection Time: 12/03/19  4:54 PM   Specimen: Nasopharyngeal Swab  Result Value Ref Range Status   SARS Coronavirus 2 NEGATIVE NEGATIVE Final    Comment: (NOTE) SARS-CoV-2 target nucleic acids are NOT DETECTED.  The SARS-CoV-2 RNA is generally detectable in upper and lower respiratory specimens during the acute phase of infection. The lowest concentration of SARS-CoV-2 viral copies this assay  can detect is 250 copies / mL. A negative result does not preclude SARS-CoV-2 infection and should not be used as the sole basis for treatment or other patient management decisions.  A negative result may occur with improper specimen collection / handling, submission of specimen other than nasopharyngeal swab, presence of viral mutation(s) within the areas targeted by this assay, and inadequate number of viral copies (<250 copies / mL). A negative result must be combined with clinical observations, patient history, and epidemiological information.  Fact Sheet for Patients:   StrictlyIdeas.no  Fact Sheet for Healthcare Providers: BankingDealers.co.za  This test is not yet approved or  cleared by the [place] FDA and has been authorized for detection and/or diagnosis of SARS-CoV-2 by FDA under an Emergency Use Authorization (EUA).  This EUA will remain in effect (meaning this test can be used) for the duration of the COVID-19 declaration under Section 564(b)(1) of the Act, 21 U.S.C. section 360bbb-3(b)(1), unless the authorization is terminated or revoked sooner.  Performed at [facility], [place] [address] [place], [place] [address]   Culture, sputum-assessment     Status: None   Collection Time: 12/04/19 12:10 PM   Specimen: Expectorated Sputum  Result Value Ref Range Status   Specimen Description EXPECTORATED SPUTUM  Final   Special Requests NONE  Final   Sputum evaluation   Final    Sputum specimen not acceptable for testing.  Please recollect.   J,FOKUO RN @[phone]  12/04/19 EB Performed at [facility] [address] [place], [place] [address]    Report Status 12/04/2019 FINAL  Final  MRSA PCR Screening     Status: None   Collection Time: 12/04/19 11:43 PM   Specimen: Nasopharyngeal  Result Value Ref Range Status  MRSA by PCR NEGATIVE NEGATIVE Final    Comment:        The GeneXpert MRSA Assay (FDA approved  for NASAL specimens only), is one component of a comprehensive MRSA colonization surveillance program. It is not intended to diagnose MRSA infection nor to guide or monitor treatment for MRSA infections. Performed at [facility], [place] [address], [place], [place] [address]          Radiology Studies: CARDIAC CATHETERIZATION  Result Date: 12/08/2019  Prox RCA to Mid RCA lesion is 25% stenosed.  Prox LAD to Mid LAD lesion is 25% stenosed.  1st Diag lesion is 30% stenosed.  LV end diastolic pressure is normal.  Medical treatment.        Scheduled Meds: . allopurinol  300 mg Oral Daily  . amiodarone  200 mg Oral Daily  . Chlorhexidine Gluconate Cloth  6 each Topical Q0600  . Chlorhexidine Gluconate Cloth  6 each Topical Q0600  . cinacalcet  180 mg Oral Q T,Th,Sa-HD  . divalproex  250 mg Oral QHS  . feeding supplement (ENSURE ENLIVE)  237 mL Oral BID BM  . guaiFENesin  600 mg Oral BID  . lamoTRIgine  25 mg Oral BID  . levETIRAcetam  500 mg Oral BID  . metoprolol tartrate  12.5 mg Oral QID  . multivitamin  1 tablet Oral QHS  . sodium chloride flush  3 mL Intravenous Q12H  . sodium chloride flush  3 mL Intravenous Q12H  . sodium chloride flush  3 mL Intravenous Q12H  . warfarin  2 mg Oral q1600  . Warfarin - Physician Dosing Inpatient   Does not apply q1600   Continuous Infusions: . sodium chloride 250 mL (12/07/19 2132)  . sodium chloride    . ceFEPime (MAXIPIME) IV 1 g (12/08/19 1731)  . doxycycline (VIBRAMYCIN) IV 100 mg (12/09/19 1025)     LOS: 5 days    Time spent: 25 minutes.    Domenic Polite, MD [facility]  12/09/2019, 12:08 PM

## 2019-12-09 NOTE — Consult Note (Signed)
Ref: Mauricia Area, MD   Subjective:  Feeling better. VS stable. Monitor shows sinus rhythm.  Objective:  Vital Signs in the last 24 hours: Temp:  [97.7 F (36.5 C)-98.4 F (36.9 C)] 97.8 F (36.6 C) (09/15 0512) Pulse Rate:  [34-103] 91 (09/15 0512) Cardiac Rhythm: Normal sinus rhythm (09/15 0700) Resp:  [17-33] 17 (09/15 0512) BP: (87-131)/(51-84) 118/81 (09/15 0512) SpO2:  [93 %-100 %] 93 % (09/15 0512) Weight:  [65.6 kg-67.1 kg] 67.1 kg (09/15 0512)  Physical Exam: BP Readings from Last 1 Encounters:  12/09/19 118/81     Wt Readings from Last 1 Encounters:  12/09/19 67.1 kg    Weight change: 0.2 kg Body mass index is 26.2 kg/m. HEENT: [place]/AT, Eyes-Brown, PERL, EOMI, Conjunctiva-Pink, Sclera-Non-icteric Neck: No JVD, No bruit, Trachea midline. Lungs:  Clearing, Bilateral. Cardiac:  Regular rhythm, normal S1 and S2, no S3. II/VI systolic murmur. Abdomen:  Soft, non-tender. BS present. Extremities:  No edema present. No cyanosis. No clubbing. Right upper arm AVF. Superficial bruize over right groin cath site and surrounding area, non-tender. CNS: AxOx3, Cranial nerves grossly intact, moves all 4 extremities.  Skin: Warm and dry.   Intake/Output from previous day: 09/14 0701 - 09/15 0700 In: 1010.1 [P.O.:600; I.V.:20.2; IV Piggyback:390] Out: 732     Lab Results: BMET    Component Value Date/Time   NA 138 12/08/2019 0822   NA 138 12/08/2019 0821   NA 139 12/08/2019 0601   NA 147 (H) 09/09/2008 1450   K 4.3 12/08/2019 0822   K 4.3 12/08/2019 0821   K 4.3 12/08/2019 0601   K 5.0 (H) 09/09/2008 1450   CL 104 12/08/2019 0601   CL 101 12/07/2019 0240   CL 98 12/06/2019 0751   CL 111 (H) 09/09/2008 1450   CO2 22 12/08/2019 0601   CO2 23 12/07/2019 0240   CO2 25 12/06/2019 0751   CO2 26 09/09/2008 1450   GLUCOSE 87 12/08/2019 0601   GLUCOSE 93 12/07/2019 0240   GLUCOSE 140 (H) 12/06/2019 0751   GLUCOSE 106 09/09/2008 1450   BUN 35 (H) 12/08/2019 0601    BUN 52 (H) 12/07/2019 0240   BUN 32 (H) 12/06/2019 0751   BUN 51 (H) 09/09/2008 1450   CREATININE 5.37 (H) 12/08/2019 0601   CREATININE 7.00 (H) 12/07/2019 0240   CREATININE 5.14 (H) 12/06/2019 0751   CREATININE 4.7 (H) 09/09/2008 1450   CALCIUM 10.1 12/08/2019 0601   CALCIUM 10.5 (H) 12/07/2019 0240   CALCIUM 10.0 12/06/2019 0751   CALCIUM 10.8 (H) 09/09/2008 1450   GFRNONAA 11 (L) 12/08/2019 0601   GFRNONAA 8 (L) 12/07/2019 0240   GFRNONAA 11 (L) 12/06/2019 0751   GFRAA 13 (L) 12/08/2019 0601   GFRAA 9 (L) 12/07/2019 0240   GFRAA 13 (L) 12/06/2019 0751   CBC    Component Value Date/Time   WBC 10.3 12/09/2019 0456   RBC 3.27 (L) 12/09/2019 0456   HGB 10.9 (L) 12/09/2019 0456   HGB 15.7 12/07/2008 1137   HCT 33.7 (L) 12/09/2019 0456   HCT 46.2 12/07/2008 1137   PLT 280 12/09/2019 0456   PLT 131 (L) 12/07/2008 1137   MCV 103.1 (H) 12/09/2019 0456   MCV 102 (H) 12/07/2008 1137   MCH 33.3 12/09/2019 0456   MCHC 32.3 12/09/2019 0456   RDW 13.9 12/09/2019 0456   RDW 12.0 12/07/2008 1137   LYMPHSABS 1.4 08/12/2019 2317   LYMPHSABS 2.2 12/07/2008 1137   MONOABS 1.0 08/12/2019 2317   EOSABS  0.1 08/12/2019 2317   EOSABS 0.2 12/07/2008 1137   BASOSABS 0.0 08/12/2019 2317   BASOSABS 0.1 12/07/2008 1137   HEPATIC Function Panel No results for input(s): PROT in the last 8760 hours.  Invalid input(s):  ALBUMIN,  AST,  ALT,  ALKPHOS,  BILIDIR,  IBILI HEMOGLOBIN A1C No components found for: HGA1C,  MPG CARDIAC ENZYMES Lab Results  Component Value Date   TROPONINI <0.30 07/05/2012   BNP No results for input(s): PROBNP in the last 8760 hours. TSH No results for input(s): TSH in the last 8760 hours. CHOLESTEROL No results for input(s): CHOL in the last 8760 hours.  Scheduled Meds: . allopurinol  300 mg Oral Daily  . amiodarone  200 mg Oral Daily  . aspirin EC  81 mg Oral Daily  . Chlorhexidine Gluconate Cloth  6 each Topical Q0600  . cinacalcet  180 mg Oral Q  T,Th,Sa-HD  . divalproex  250 mg Oral QHS  . feeding supplement (ENSURE ENLIVE)  237 mL Oral BID BM  . guaiFENesin  600 mg Oral BID  . lamoTRIgine  25 mg Oral BID  . levETIRAcetam  500 mg Oral BID  . metoprolol tartrate  12.5 mg Oral QID  . multivitamin  1 tablet Oral QHS  . sodium chloride flush  3 mL Intravenous Q12H  . sodium chloride flush  3 mL Intravenous Q12H  . sodium chloride flush  3 mL Intravenous Q12H  . warfarin  2 mg Oral q1600   Continuous Infusions: . sodium chloride 250 mL (12/07/19 2132)  . sodium chloride    . ceFEPime (MAXIPIME) IV 1 g (12/08/19 1731)  . doxycycline (VIBRAMYCIN) IV 100 mg (12/08/19 2049)   PRN Meds:.sodium chloride, sodium chloride, acetaminophen **OR** acetaminophen, melatonin, metoprolol tartrate, ondansetron **OR** ondansetron (ZOFRAN) IV, sodium chloride, sodium chloride flush, sodium chloride flush, sorbitol  Assessment/Plan: Acute systolic left heart failure Multifocal pneumonia Non-ischemic cardiomyopathy Mild CAD Severe MR Paroxysmal atrial fibrillation ESRD Anemia of chronic disease  Start low dose warfarin for prophylaxis, goal 1.8 to 2.0 due to pervious h/o excessive bleed. Increase activity. F/U 1 week   LOS: 5 days   Time spent including chart review, lab review, examination, discussion with patient : 30 min   Dixie Dials  MD  12/09/2019, 9:59 AM

## 2019-12-09 NOTE — Progress Notes (Signed)
ANTICOAGULATION CONSULT NOTE - follow up  Pharmacy Consult : Heparin Indication: atrial fibrillation  Allergies  Allergen Reactions  . Diphenhydramine Hcl Palpitations    Patient Measurements: Height: 5\' 3"  (160 cm) Weight: 67.1 kg (147 lb 14.9 oz) IBW/kg (Calculated) : 56.9 Heparin Dosing Weight: 67.1 kg  Vital Signs: Temp: 98 F (36.7 C) (09/15 2109) Temp Source: Oral (09/15 2109) BP: 103/73 (09/15 2109) Pulse Rate: 66 (09/15 2109)  Labs: Recent Labs    12/07/19 0240 12/07/19 1336 12/08/19 0601 12/08/19 0601 12/08/19 0821 12/08/19 0821 12/08/19 0822 12/09/19 0456 12/09/19 2153  HGB 10.4*  --  10.7*   < > 10.9*   < > 10.5* 10.9*  --   HCT 31.4*  --  33.2*   < > 32.0*  --  31.0* 33.7*  --   PLT 264  --  246  --   --   --   --  280  --   HEPARINUNFRC 0.46   < > 0.46  --   --   --   --  <0.10* <0.10*  CREATININE 7.00*  --  5.37*  --   --   --   --   --   --    < > = values in this interval not displayed.   Assessment:  59 yr old male to begin IV heparin for atrial fibrillation. Hx PAF but no anticoagulation PTA due to hx bleeding episodes from HD cath site while on Warfarin in the past.  Patient now s/p Cath done on 12/08/19. Dr. Doylene Canard said to hold IV heparin drip post cath yesterday and to reevaluate 9/15. Dr. Doylene Canard started Warfarin today 9/15 and Dr. Broadus John requested pharmacy to monitor warfarin and to resume IV heparin drip today 9/15.  Noted that patient has hx bleeding episodes from HD cath site while on Warfarin in the past. Dr. Doylene Canard wants lower INR goal of 1.8-2.0  Heparin level undetectable on gtt at 1150 units/hr. No issues with line or bleeding reported per RN.  Goal of Therapy:  INR 1.8-2.0 per Dr. Doylene Canard due to h/o bleeding Heparin level 0.3-0.7 units/ml Monitor platelets by anticoagulation protocol: Yes   Plan:  Increase IV heparin drip to 1300 units/hr - cautious increase as pt has been therapeutic on 1150 in the past Check 8 hour Heparin  level   Sherlon Handing, PharmD, BCPS Please see amion for complete clinical pharmacist phone list 12/09/2019 11:08 PM

## 2019-12-09 NOTE — Progress Notes (Signed)
Physical Therapy Treatment Patient Details Name: Aadvik Roker MRN: [id number] DOB: 12/01/1960 Today's Date: 12/09/2019    History of Present Illness The pt is a 59 yo male presenting with SOB and hypoxic (85% on RA) at rest. Echo revealed EF of 20-25%, severe mitral regurgitation with plan for heart cath in future. S/P R/L heart cath 9/14. PMH includes: HTN, HLD, HF, renal insufficiency, ESRD on HD TTS, and afib.    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  Follow Up Recommendations  No PT follow up;Supervision for mobility/OOB     Equipment Recommendations  Other (comment) (pt has needed equipment)    Recommendations for Other Services       Precautions / Restrictions Precautions Precaution Comments: Fall risk greatly decreased    Mobility  Bed Mobility Overal bed mobility: Modified Independent             General bed mobility comments: no assist required, HOB elevated  Transfers Overall transfer level: Needs assistance Equipment used: None Transfers: Sit to/from Stand Sit to Stand: Supervision         General transfer comment: Cues to self-monitor fro activity tolerance  Ambulation/Gait Ambulation/Gait assistance: Supervision Gait Distance (Feet): 400 Feet Assistive device: None Gait Pattern/deviations: Step-through pattern;Decreased stride length;Drifts right/left     General Gait Details: More stable gait today,  with no instances of scissoring or loss of balance; Cues to self-monitor for activity tolerance   Stairs             Wheelchair Mobility    Modified Rankin (Stroke Patients Only)       Balance             Standing balance-Leahy Scale: Good                              Cognition Arousal/Alertness: Awake/alert Behavior During Therapy: WFL for tasks assessed/performed Overall Cognitive Status: Within Functional Limits for tasks assessed                                 General Comments: appears [facility]      Exercises      General Comments General comments (skin integrity, edema, etc.): Session conducted on Room Air, and O2 sats remained at safe, acceptable levels; what was notable was that towards teh end of the walk his HR rose to 120s from 60s at the beginning of session      Pertinent Vitals/Pain Pain Assessment: No/denies pain    Home Living                      Prior Function            PT Goals (current goals can now be found in the care plan section) Acute Rehab PT Goals Patient Stated Goal:  return home, get stronger PT Goal Formulation: With patient Time For Goal Achievement: 12/19/19 Potential to Achieve Goals: Good Progress towards PT goals: Progressing toward goals    Frequency    Min 3X/week      PT Plan Current plan remains appropriate    Co-evaluation              AM-PAC PT "6 Clicks" Mobility   Outcome Measure  Help needed turning from your back to your side while in a flat bed without using bedrails?: None Help needed moving from lying on your back to sitting on the side of a flat bed without using bedrails?: None Help needed moving to and from a bed to a chair (including a wheelchair)?: None Help needed standing up from a chair using your arms (e.g., wheelchair or bedside chair)?: None Help needed to walk in hospital room?: None Help needed climbing 3-5 steps with a railing? : None 6  Click Score: 24    End of Session Equipment Utilized During Treatment: Gait belt Activity Tolerance: Patient tolerated treatment well Patient left: in bed;with call bell/phone within reach (sitting EOB, prepping to eat lunch) Nurse Communication: Mobility status PT Visit Diagnosis: Difficulty in walking, not elsewhere classified (R26.2)     Time: 1140-1200 PT Time Calculation (min) (ACUTE ONLY): 20 min  Charges:  $Gait Training: 8-22 mins                     Roney Marion, PT  Acute Rehabilitation Services Pager [phone] Office [place] 12/09/2019, 2:11 PM

## 2019-12-09 NOTE — Progress Notes (Signed)
[facility] Progress Note   Subjective:   Denies SOB this AM, O2 sat stable on RA. Denies CP, palpitations, abdominal pain, N/V/D. Seen by cardiology today, plan to start warfarin with INR goal 1.8-2.  Objective Vitals:   12/08/19 1555 12/08/19 2106 12/09/19 0512 12/09/19 0950  BP: (!) 103/51 107/77 118/81 112/72  Pulse: (!) 51 94 91 96  Resp: 18 18 17 18   Temp: 98.4 F (36.9 C) 97.7 F (36.5 C) 97.8 F (36.6 C) 98.1 F (36.7 C)  TempSrc:  Oral Oral Oral  SpO2: 94% 93% 93% 96%  Weight:   67.1 kg   Height:       Physical Exam General:Well developed, alert, in NAD Heart:RRR No murmurs, rubs or gallops Lungs:CTA bilaterally without wheezing, rhonchi or rales Abdomen:Soft, non-tender, non-distended, +BS Extremities:No edema b/l lower extremities Dialysis Access:LUE AVF+ bruit  Additional Objective Labs: Basic Metabolic Panel: Recent Labs  Lab 12/05/19 0420 12/05/19 1305 12/06/19 0751 12/06/19 0751 12/07/19 0240 12/07/19 0240 12/08/19 0601 12/08/19 0821 12/08/19 0822  NA   < >  --  138   < > 139   < > 139 138 138  K   < >  --  3.2*   < > 3.9   < > 4.3 4.3 4.3  CL   < >  --  98  --  101  --  104  --   --   CO2   < >  --  25  --  23  --  22  --   --   GLUCOSE   < >  --  140*  --  93  --  87  --   --   BUN   < >  --  32*  --  52*  --  35*  --   --   CREATININE   < >  --  5.14*  --  7.00*  --  5.37*  --   --   CALCIUM   < >  --  10.0  --  10.5*  --  10.1  --   --   PHOS  --  5.7*  --   --   --   --   --   --   --    < > = values in this interval not displayed.   Liver Function Tests: Recent Labs  Lab 12/07/19 1336  ALBUMIN 2.6*   No results for input(s): LIPASE, AMYLASE in the last 168 hours. CBC: Recent Labs  Lab 12/05/19 0420 12/05/19 0420 12/06/19 0751 12/06/19 0751 12/07/19 0240 12/07/19 0240 12/08/19 0601 12/08/19 0601 12/08/19 0821 12/08/19 0822 12/09/19 0456  WBC 9.5   < > 8.7   < > 8.0  --  8.3  --   --   --  10.3  HGB  10.5*   < > 11.2*   < > 10.4*   < > 10.7*   < > 10.9* 10.5* 10.9*  HCT 32.4*   < > 34.4*   < > 31.4*   < > 33.2*   < > 32.0* 31.0* 33.7*  MCV 101.6*  --  100.3*  --  101.6*  --  102.2*  --   --   --  103.1*  PLT 240   < > 280   < > 264  --  246  --   --   --  280   < > = values in this interval not displayed.  Blood Culture    Component Value Date/Time   SDES EXPECTORATED SPUTUM 12/04/2019 1210   SPECREQUEST NONE 12/04/2019 1210   REPTSTATUS 12/04/2019 FINAL 12/04/2019 1210    Studies/Results: CARDIAC CATHETERIZATION  Result Date: 12/08/2019  Prox RCA to Mid RCA lesion is 25% stenosed.  Prox LAD to Mid LAD lesion is 25% stenosed.  1st Diag lesion is 30% stenosed.  LV end diastolic pressure is normal.  Medical treatment.   Medications: . sodium chloride 250 mL (12/07/19 2132)  . sodium chloride    . ceFEPime (MAXIPIME) IV 1 g (12/08/19 1731)  . doxycycline (VIBRAMYCIN) IV 100 mg (12/08/19 2049)   . allopurinol  300 mg Oral Daily  . amiodarone  200 mg Oral Daily  . aspirin EC  81 mg Oral Daily  . Chlorhexidine Gluconate Cloth  6 each Topical Q0600  . cinacalcet  180 mg Oral Q T,Th,Sa-HD  . divalproex  250 mg Oral QHS  . feeding supplement (ENSURE ENLIVE)  237 mL Oral BID BM  . guaiFENesin  600 mg Oral BID  . lamoTRIgine  25 mg Oral BID  . levETIRAcetam  500 mg Oral BID  . metoprolol tartrate  12.5 mg Oral QID  . multivitamin  1 tablet Oral QHS  . sodium chloride flush  3 mL Intravenous Q12H  . sodium chloride flush  3 mL Intravenous Q12H  . sodium chloride flush  3 mL Intravenous Q12H  . warfarin  2 mg Oral q1600  . Warfarin - Physician Dosing Inpatient   Does not apply q1600    Dialysis Orders: [facility] T,Th,S 4 hours 180 NRe 400/800 69.5 kg 2.0 K/ 2.0 Ca AVG -Heparin 6800 units IV TIW -Sensipar 180 mg PO TIW -Calcitriol 1.0 mcg PO TIW (last PTH 744 11/05/2019)  Assessment/Plan: 1. Pulmonary edema/Volume overload: Has been leaving under EDW at OP center, H/O  HFrEF.CXR significantly improved with serial HD. S/p cardiac cath 9/14.  Now off supplemental O2. 2. A. Fib-Per primary/cardiology. On heparin drip, amiodarone and metoprolol. Keep potassium >4. 3. Possible PNA-ABX per primary- doxycycline and cefepime. 4. ESRD -usual HD TTS. Serial HD this admission for volume. Resume TTS schedule 5. Hypertension/volume- As noted above.BP is controlled(on metoprolol for a.fib). 6. Anemia- HGB 10.9.No ESA needed. 7. Metabolic bone disease-Chronic issues with hyperphosphatemia as OP.Corrected calcium 11.2. No contributory meds on list.Hold calcitriol, continue sensipar. Last PTH was 664 on 9/9, may need to increase sensipar dose (pt agreeable to take on non-HD days but will need to find out if this is covered outpatient).SPEP and SFLC pending. Phos close to goal- 5.7.  8. Nutrition- Renal diet with fluid restrictions.  8. H/O Afib. On amiodarone, metoprolol. Cardiology starting low dose warfarin.  9. H/O HFrEF. Repeat ECHOEF down 20-25% (was 30-35%).Dr. Audria Nine, PA-C 12/09/2019, 10:19 AM  [facility] Pager: [phone]

## 2019-12-09 NOTE — Progress Notes (Signed)
ANTICOAGULATION CONSULT NOTE - follow up  Pharmacy Consult : Restart IV heparin 12/09/19 Indication: atrial fibrillation  Allergies  Allergen Reactions  . Diphenhydramine Hcl Palpitations    Patient Measurements: Height: 5\' 3"  (160 cm) Weight: 67.1 kg (147 lb 14.9 oz) IBW/kg (Calculated) : 56.9 Heparin Dosing Weight: 67.1 kg  Vital Signs: Temp: 98.1 F (36.7 C) (09/15 0950) Temp Source: Oral (09/15 0950) BP: 112/72 (09/15 0950) Pulse Rate: 96 (09/15 0950)  Labs: Recent Labs    12/07/19 0240 12/07/19 0240 12/07/19 1336 12/08/19 0601 12/08/19 0601 12/08/19 0821 12/08/19 0821 12/08/19 0822 12/09/19 0456  HGB 10.4*   < >  --  10.7*   < > 10.9*   < > 10.5* 10.9*  HCT 31.4*   < >  --  33.2*   < > 32.0*  --  31.0* 33.7*  PLT 264  --   --  246  --   --   --   --  280  HEPARINUNFRC 0.46   < > 0.21* 0.46  --   --   --   --  <0.10*  CREATININE 7.00*  --   --  5.37*  --   --   --   --   --    < > = values in this interval not displayed.   Assessment:  59 yr old male to begin IV heparin for atrial fibrillation. Hx PAF but no anticoagulation PTA due to hx bleeding episodes from HD cath site while on Warfarin in the past.  Patient now s/p Cath done on 12/08/19. Dr. Doylene Canard said to hold IV heparin drip post cath yesterday and to reevaluate 9/15. Dr. Doylene Canard started Warfarin today 9/15 and Dr. Broadus John requested pharmacy to monitor warfarin and to resume IV heparin drip today 9/15.  Noted that patient has hx bleeding episodes from HD cath site while on Warfarin in the past. Dr. Doylene Canard wants lower INR goal of 1.8-2.0  Goal of Therapy:   INR 1.8-2.0 per Dr. Doylene Canard due to h/o bleeding Heparin level 0.3-0.7 units/ml Monitor platelets by anticoagulation protocol: Yes   Plan:  Restart IV heparin drip at 1150 units/hr  Check 6 hour Heparin level  Warfarin 2mg  today , lower goal of 1.8-2.0 per Dr. Doylene Canard due to h/o bleeding from HD cath site in past Daily HL, INR and CBC  Nicole Cella, [place] Please utilize Amion for appropriate phone number to reach the unit pharmacist ([place])  12/09/2019

## 2019-12-09 NOTE — Progress Notes (Signed)
Transitions of Care Pharmacist Note  Peter Sloan is a 59 y.o. male that has been diagnosed with A Fib and will be prescribed Coumadin (warfarin)  at discharge.   Patient Education: I provided the following education on 9/15 to the patient: How to take the medication Described what the medication is Signs of bleeding Signs/symptoms of VTE and stroke  Answered their questions Dietary and medication precautions with warfarin    Thank you,   Dimple Nanas, PharmD PGY-1 Acute Care Pharmacy Resident 12/09/2019 6:48 PM

## 2019-12-09 NOTE — Progress Notes (Signed)
PHARMACY NOTE:  ANTIMICROBIAL RENAL DOSAGE ADJUSTMENT  Current antimicrobial regimen includes a mismatch between antimicrobial dosage and estimated renal function.  As per policy approved by the Pharmacy & Therapeutics and Medical Executive Committees, the antimicrobial dosage will be adjusted accordingly.  Current antimicrobial dosage:  Cefdinir 300mg  BID  Indication: HCAP  Renal Function:  Estimated Creatinine Clearance: 12.2 mL/min (A) (by C-G formula based on SCr of 5.37 mg/dL (H)). []      On intermittent HD, scheduled: []      On CRRT    Antimicrobial dosage has been changed to:  Cefdinir 300mg  daily  Additional comments:   Peter Sloan A. Levada Dy, PharmD, BCPS, FNKF Clinical Pharmacist [place] Please utilize Amion for appropriate phone number to reach the unit pharmacist ([place])   12/09/2019 12:17 PM

## 2019-12-10 LAB — CBC
HCT: 30 % — ABNORMAL LOW (ref 39.0–52.0)
Hemoglobin: 10 g/dL — ABNORMAL LOW (ref 13.0–17.0)
MCH: 34.1 pg — ABNORMAL HIGH (ref 26.0–34.0)
MCHC: 33.3 g/dL (ref 30.0–36.0)
MCV: 102.4 fL — ABNORMAL HIGH (ref 80.0–100.0)
Platelets: 248 10*3/uL (ref 150–400)
RBC: 2.93 MIL/uL — ABNORMAL LOW (ref 4.22–5.81)
RDW: 14 % (ref 11.5–15.5)
WBC: 9.8 10*3/uL (ref 4.0–10.5)
nRBC: 0 % (ref 0.0–0.2)

## 2019-12-10 LAB — KAPPA/LAMBDA LIGHT CHAINS
Kappa free light chain: 130.8 mg/L — ABNORMAL HIGH (ref 3.3–19.4)
Kappa, lambda light chain ratio: 2.47 — ABNORMAL HIGH (ref 0.26–1.65)
Lambda free light chains: 52.9 mg/L — ABNORMAL HIGH (ref 5.7–26.3)

## 2019-12-10 LAB — PROTEIN ELECTROPHORESIS, SERUM
A/G Ratio: 1 (ref 0.7–1.7)
Albumin ELP: 2.5 g/dL — ABNORMAL LOW (ref 2.9–4.4)
Alpha-1-Globulin: 0.3 g/dL (ref 0.0–0.4)
Alpha-2-Globulin: 0.8 g/dL (ref 0.4–1.0)
Beta Globulin: 0.8 g/dL (ref 0.7–1.3)
Gamma Globulin: 0.5 g/dL (ref 0.4–1.8)
Globulin, Total: 2.5 g/dL (ref 2.2–3.9)
Total Protein ELP: 5 g/dL — ABNORMAL LOW (ref 6.0–8.5)

## 2019-12-10 LAB — BASIC METABOLIC PANEL
Anion gap: 15 (ref 5–15)
BUN: 66 mg/dL — ABNORMAL HIGH (ref 6–20)
CO2: 21 mmol/L — ABNORMAL LOW (ref 22–32)
Calcium: 10.5 mg/dL — ABNORMAL HIGH (ref 8.9–10.3)
Chloride: 102 mmol/L (ref 98–111)
Creatinine, Ser: 7.83 mg/dL — ABNORMAL HIGH (ref 0.61–1.24)
GFR calc Af Amer: 8 mL/min — ABNORMAL LOW (ref >60)
GFR calc non Af Amer: 7 mL/min — ABNORMAL LOW (ref >60)
Glucose, Bld: 93 mg/dL (ref 70–99)
Potassium: 5.4 mmol/L — ABNORMAL HIGH (ref 3.5–5.1)
Sodium: 138 mmol/L (ref 135–145)

## 2019-12-10 LAB — PROTIME-INR
INR: 1.1 (ref 0.8–1.2)
Prothrombin Time: 13.5 seconds (ref 11.4–15.2)

## 2019-12-10 LAB — HEPARIN LEVEL (UNFRACTIONATED): Heparin Unfractionated: 0.62 IU/mL (ref 0.30–0.70)

## 2019-12-10 MED ORDER — WARFARIN SODIUM 3 MG PO TABS: 3.0000 mg | ORAL_TABLET | Freq: Every day | ORAL | Status: DC

## 2019-12-10 MED ORDER — COUMADIN BOOK
Freq: Once | Status: AC
  Filled 2019-12-10: qty 1

## 2019-12-10 MED ORDER — WARFARIN VIDEO: Freq: Once | Status: DC

## 2019-12-10 MED ORDER — WARFARIN SODIUM 2 MG PO TABS: 2.0000 mg | ORAL_TABLET | Freq: Every day | ORAL | Status: DC

## 2019-12-10 MED ORDER — WARFARIN SODIUM 2 MG PO TABS
2.0000 mg | ORAL_TABLET | Freq: Every day | ORAL | Status: DC
  Administered 2019-12-10: 2 mg via ORAL
  Filled 2019-12-10 (×2): qty 1

## 2019-12-10 NOTE — Consult Note (Signed)
Ref: Mauricia Area, MD   Subjective:  Awake. No chest pain or shortness of breath. Monitor: Sinus rhythm. VS stable.  Objective:  Vital Signs in the last 24 hours: Temp:  [97.6 F (36.4 C)-98.9 F (37.2 C)] 98.1 F (36.7 C) (09/16 1155) Pulse Rate:  [48-96] 52 (09/16 1329) Cardiac Rhythm: Normal sinus rhythm (09/16 0725) Resp:  [18-29] 18 (09/16 1329) BP: (91-127)/(50-73) 111/63 (09/16 1329) SpO2:  [93 %-100 %] 94 % (09/16 1329) Weight:  [64.6 kg-67.1 kg] 64.6 kg (09/16 1155)  Physical Exam: BP Readings from Last 1 Encounters:  12/10/19 111/63     Wt Readings from Last 1 Encounters:  12/10/19 64.6 kg    Weight change:  Body mass index is 25.23 kg/m. HEENT: [place]/AT, Eyes-Blue, Conjunctiva-Pale pink, Sclera-Non-icteric Neck: No JVD, No bruit, Trachea midline. Lungs:  Clearing, Bilateral. Cardiac:  Regular rhythm, normal S1 and S2, no S3. II/VI systolic murmur. Abdomen:  Soft, non-tender. BS present. Extremities:  No edema present. No cyanosis. No clubbing. CNS: AxOx3, Cranial nerves grossly intact, moves all 4 extremities. LUE AV fistula. Skin: Warm and dry.   Intake/Output from previous day: 09/15 0701 - 09/16 0700 In: 1323.3 [P.O.:1147; I.V.:176.3] Out: 400 [Urine:400]    Lab Results: BMET    Component Value Date/Time   NA 138 12/10/2019 0627   NA 138 12/08/2019 0822   NA 138 12/08/2019 0821   NA 147 (H) 09/09/2008 1450   K 5.4 (H) 12/10/2019 0627   K 4.3 12/08/2019 0822   K 4.3 12/08/2019 0821   K 5.0 (H) 09/09/2008 1450   CL 102 12/10/2019 0627   CL 104 12/08/2019 0601   CL 101 12/07/2019 0240   CL 111 (H) 09/09/2008 1450   CO2 21 (L) 12/10/2019 0627   CO2 22 12/08/2019 0601   CO2 23 12/07/2019 0240   CO2 26 09/09/2008 1450   GLUCOSE 93 12/10/2019 0627   GLUCOSE 87 12/08/2019 0601   GLUCOSE 93 12/07/2019 0240   GLUCOSE 106 09/09/2008 1450   BUN 66 (H) 12/10/2019 0627   BUN 35 (H) 12/08/2019 0601   BUN 52 (H) 12/07/2019 0240   BUN 51 (H)  09/09/2008 1450   CREATININE 7.83 (H) 12/10/2019 0627   CREATININE 5.37 (H) 12/08/2019 0601   CREATININE 7.00 (H) 12/07/2019 0240   CREATININE 4.7 (H) 09/09/2008 1450   CALCIUM 10.5 (H) 12/10/2019 0627   CALCIUM 10.1 12/08/2019 0601   CALCIUM 10.5 (H) 12/07/2019 0240   CALCIUM 10.8 (H) 09/09/2008 1450   GFRNONAA 7 (L) 12/10/2019 0627   GFRNONAA 11 (L) 12/08/2019 0601   GFRNONAA 8 (L) 12/07/2019 0240   GFRAA 8 (L) 12/10/2019 0627   GFRAA 13 (L) 12/08/2019 0601   GFRAA 9 (L) 12/07/2019 0240   CBC    Component Value Date/Time   WBC 9.8 12/10/2019 0627   RBC 2.93 (L) 12/10/2019 0627   HGB 10.0 (L) 12/10/2019 0627   HGB 15.7 12/07/2008 1137   HCT 30.0 (L) 12/10/2019 0627   HCT 46.2 12/07/2008 1137   PLT 248 12/10/2019 0627   PLT 131 (L) 12/07/2008 1137   MCV 102.4 (H) 12/10/2019 0627   MCV 102 (H) 12/07/2008 1137   MCH 34.1 (H) 12/10/2019 0627   MCHC 33.3 12/10/2019 0627   RDW 14.0 12/10/2019 0627   RDW 12.0 12/07/2008 1137   LYMPHSABS 1.4 08/12/2019 2317   LYMPHSABS 2.2 12/07/2008 1137   MONOABS 1.0 08/12/2019 2317   EOSABS 0.1 08/12/2019 2317   EOSABS 0.2 12/07/2008 1137  BASOSABS 0.0 08/12/2019 2317   BASOSABS 0.1 12/07/2008 1137   HEPATIC Function Panel No results for input(s): PROT in the last 8760 hours.  Invalid input(s):  ALBUMIN,  AST,  ALT,  ALKPHOS,  BILIDIR,  IBILI HEMOGLOBIN A1C No components found for: HGA1C,  MPG CARDIAC ENZYMES Lab Results  Component Value Date   TROPONINI <0.30 07/05/2012   BNP No results for input(s): PROBNP in the last 8760 hours. TSH No results for input(s): TSH in the last 8760 hours. CHOLESTEROL No results for input(s): CHOL in the last 8760 hours.  Scheduled Meds: . allopurinol  300 mg Oral Daily  . amiodarone  200 mg Oral Daily  . cefdinir  300 mg Oral Q24H  . Chlorhexidine Gluconate Cloth  6 each Topical Q0600  . Chlorhexidine Gluconate Cloth  6 each Topical Q0600  . cinacalcet  180 mg Oral Q T,Th,Sa-HD  .  divalproex  250 mg Oral QHS  . feeding supplement (ENSURE ENLIVE)  237 mL Oral BID BM  . guaiFENesin  600 mg Oral BID  . lamoTRIgine  25 mg Oral BID  . levETIRAcetam  500 mg Oral BID  . metoprolol tartrate  12.5 mg Oral QID  . multivitamin  1 tablet Oral QHS  . sodium chloride flush  3 mL Intravenous Q12H  . warfarin  2 mg Oral q1600  . Warfarin - Pharmacist Dosing Inpatient   Does not apply q1600   Continuous Infusions: . sodium chloride 250 mL (12/07/19 2132)  . heparin 1,300 Units/hr (12/10/19 1335)   PRN Meds:.sodium chloride, acetaminophen **OR** acetaminophen, melatonin, metoprolol tartrate, ondansetron **OR** ondansetron (ZOFRAN) IV, sodium chloride, sodium chloride flush, sorbitol  Assessment/Plan:  Acute systolic left heart failure Multifocal pneumonia Non-ischemic cardiomyopathy Mild CAD Severe MR Paroxysmal atrial fibrillation ESRD Anemia of chronic disease  Discussed warfarin, heparin and Lovenox use. Patient willing to try 2 mg. Warfarin daily without bridging and will get PT/INR in my office twice a week till regulated. If he can not tolerate low dose warfarin he understood to call office early and adjust dose or not use warfarin if bleeding is severe. F/U on Mondays and Fridays.   LOS: 6 days   Time spent including chart review, lab review, examination, discussion with patient :  min   Dixie Dials  MD  12/10/2019, 3:24 PM

## 2019-12-10 NOTE — Progress Notes (Signed)
Physical Therapy Treatment Patient Details Name: Peter Sloan MRN: [id number] DOB: 1961-03-03 Today's Date: 12/10/2019    History of Present Illness The pt is a 59 yo male presenting with SOB and hypoxic (85% on RA) at rest. Echo revealed EF of 20-25%, severe mitral regurgitation with plan for heart cath in future. S/P R/L heart cath 9/14. PMH includes: HTN, HLD, HF, renal insufficiency, ESRD on HD TTS, and afib.    PT Comments    The pt was in bed upon arrival of PT, agreeable to session with focus on progressing ambulation and stair training in preparation for anticipated d/c home. The pt was able to demo significant improvements in stability with gait and capacity for stairs. The pt was able to ambulate with supervision only, and no UE support, with improved gait speed. He was also able to complete 3 sets of 6 steps with single UE support for safety. The pt completed all of this activity on RA with VSS and no DOE. The pt will be safe to return home with family assist when medically cleared, but will continue to benefit from skilled PT acutely to further progress dynamic stability and endurance.     Follow Up Recommendations  No PT follow up;Supervision for mobility/OOB     Equipment Recommendations  None recommended by PT (pt has needed equipment)    Recommendations for Other Services       Precautions / Restrictions Precautions Precautions: Fall Precaution Comments: Fall risk greatly decreased Restrictions Weight Bearing Restrictions: No    Mobility  Bed Mobility Overal bed mobility: Modified Independent             General bed mobility comments: no assist required, HOB elevated  Transfers Overall transfer level: Needs assistance Equipment used: None Transfers: Sit to/from Stand Sit to Stand: Supervision         General transfer comment: Cues to self-monitor fro activity tolerance  Ambulation/Gait Ambulation/Gait assistance: Supervision Gait Distance  (Feet): 300 Feet Assistive device: None Gait Pattern/deviations: Step-through pattern;Decreased stride length;Drifts right/left Gait velocity: 0.57 m/s Gait velocity interpretation: <1.8 ft/sec, indicate of risk for recurrent falls General Gait Details: continued improvement in stability with gait. HR and SpO2 maintained within normal limits on RA   Stairs Stairs: Yes Stairs assistance: Supervision Stair Management: Forwards;Alternating pattern;One rail Right Number of Stairs: 6 (x 3) General stair comments: Pt completed x6 steps x 3 with use of alternating pattern and use of single rail. SpO2 96% on RA.      Balance Overall balance assessment: Needs assistance Sitting-balance support: No upper extremity supported;Feet supported Sitting balance-Leahy Scale: Good     Standing balance support: No upper extremity supported;During functional activity Standing balance-Leahy Scale: Good Standing balance comment: no UE support for gait and turns with gait, pt with no instances of scissoring or LOB, stairs with single rail                            Cognition Arousal/Alertness: Awake/alert Behavior During Therapy: WFL for tasks assessed/performed Overall Cognitive Status: Within Functional Limits for tasks assessed                                 General Comments: appears [facility]      Exercises      General Comments General comments (skin integrity, edema, etc.): HR and SpO2 stable through session on RA despite long walk  and stairs, no noteable DOE      Pertinent Vitals/Pain Pain Assessment: No/denies pain           PT Goals (current goals can now be found in the care plan section) Acute Rehab PT Goals Patient Stated Goal: return home, get stronger PT Goal Formulation: With patient Time For Goal Achievement: 12/19/19 Potential to Achieve Goals: Good Progress towards PT goals: Progressing toward goals    Frequency    Min 3X/week      PT  Plan Current plan remains appropriate       AM-PAC PT "6 Clicks" Mobility   Outcome Measure  Help needed turning from your back to your side while in a flat bed without using bedrails?: None Help needed moving from lying on your back to sitting on the side of a flat bed without using bedrails?: None Help needed moving to and from a bed to a chair (including a wheelchair)?: None Help needed standing up from a chair using your arms (e.g., wheelchair or bedside chair)?: None Help needed to walk in hospital room?: None Help needed climbing 3-5 steps with a railing? : A Little 6 Click Score: 23    End of Session Equipment Utilized During Treatment: Gait belt Activity Tolerance: Patient tolerated treatment well Patient left: in bed;with call bell/phone within reach;with family/visitor present Nurse Communication: Mobility status PT Visit Diagnosis: Difficulty in walking, not elsewhere classified (R26.2)     Time: 3329-5188 PT Time Calculation (min) (ACUTE ONLY): 18 min  Charges:  $Gait Training: 8-22 mins                     Karma Ganja, PT, DPT   Acute Rehabilitation Department Pager #: [phone]   Otho Bellows 12/10/2019, 4:08 PM

## 2019-12-10 NOTE — Progress Notes (Addendum)
PROGRESS NOTE    Peter Sloan  NWG:[id number] DOB: 13-Nov-1960 DOA: 12/03/2019 PCP: Mauricia Area, MD   Brief Narrative: 59 year old with past medical history significant for ESRD on hemodialysis, cardiomyopathy with ejection fraction 30 to 35%, seizure disorder, paroxysmal atrial fibrillation not on anticoagulation, presented to ED w/ worsening shortness of breath and cough for the last 1 or 2 weeks, productive cough. -in ED, hypoxic-sats were 85 on room air, tachypneic, chest x-ray with widespread bilateral groundglass airspace disease suggestive of multifocal pneumonia or edema.  Elevated BNP, mild elevation of troponin.  Covid PCR negative. -Seen by cardiology this admission, hospital course complicated by A. fib RVR, started on a heparin drip -Also underwent left heart catheterization which noted nonobstructive coronary disease  Assessment & Plan:  Acute Hypoxic Respiratory Failure secondary to Acute systolic heart failure exacerbation and pneumonia: -Patient presented with hypoxemia, dyspnea, chest x-ray asymmetric edema versus pneumonia. Prior EF 35 %. -Nephrology consulted, hemodialysis resumed -Procalcitonin was greater than 3, initially treated with azithromycin was then changed to doxycycline due to prolonged QTC  -Also on cefepime, day 5 today, changed to oral cefdinir yesterday for 2 more days to complete 7-day course, day 6 of Abx today -Continue flutter valve, incentive spirometry -Chest x ray 9/12: Marked improvement in aeration throughout both lungs since the examination 3 days ago, though patchy ground-glass airspace opacities persist, indicating improving edema versus pneumonia. -Weaned off O2 -Ambulate, discharge planning  Acute systolic heart failure exacerbation, paroxysmal A. fib:  -Cardiology consulted. Echo with lower EF at 20, Severe mitral regurgitation.  -Underwent left heart cath 9/14, nonobstructive CAD -Volume managed with HD  Hyperkalemia:   -Should correct with hemodialysis  ESRD: -Nephrology following, dialysis continued  Paroxysmal A. Fib: -Cardiology consulted. -A fib RVR morning of 9/12,  -Now in sinus rhythm -Continue low-dose oral amiodarone and metoprolol -Remains on IV heparin, starting Coumadin for anticoagulation, per Cards they will arrange FU in Coumadin clinic   Mild elevation of troponin: Related to demand ischemia in the setting of heart failure exacerbation. -Left heart cath with nonobstructive CAD, stop aspirin in the setting of heparin and warfarin at this time  Seizure disorder: Continue with Keppra and Depakote.  Constipation; -Improved with laxatives   Moderate protein calorie malnutrition -Supplements ordered  Nodular opacity left base -Noted on x-ray, needs follow-up  Abnormal SPEP -elevated Kappa and lambda free light chains -likely non specific, FU SPEP -d/w oncology, Dr. Burr Medico, who suspected this was nonspecific, recommended follow-up on SPEP and have nephrology refer to heme-onc as outpatient if needed  Estimated body mass index is 25.23 kg/m as calculated from the following:   Height as of this encounter: 5\' 3"  (1.6 m).   Weight as of this encounter: 64.6 kg.   DVT prophylaxis: Heparin IV and Coumadin Code Status: Full code Family Communication: Care discussed with patient Disposition Plan:  Status is: Inpatient  Remains inpatient appropriate because:IV treatments appropriate due to intensity of illness or inability to take PO   Dispo: The patient is from: Home              Anticipated d/c is to: Home              Anticipated d/c date is: 9/17              Patient currently is not medically stable to d/c.  Consultants:   Nephrology  Cardiology  Procedures:   HD  ECHO;  Antimicrobials:  Ceftriaxone 9/10 Azithromycin 9/10.  Subjective: -Seen  on dialysis this morning, no events overnight, breathing is improving, still with intermittent  cough  Objective: Vitals:   12/10/19 1100 12/10/19 1134 12/10/19 1155 12/10/19 1329  BP: (!) 101/50 (!) 127/59 (!) 117/59 111/63  Pulse: (!) 48 (!) 56 96 (!) 52  Resp:   (!) 29 18  Temp:   98.1 F (36.7 C)   TempSrc:   Oral   SpO2:   98% 94%  Weight:   64.6 kg   Height:        Intake/Output Summary (Last 24 hours) at 12/10/2019 1404 Last data filed at 12/10/2019 1155 Gross per 24 hour  Intake 656.27 ml  Output 1566 ml  Net -909.73 ml   Filed Weights   12/09/19 0512 12/10/19 0729 12/10/19 1155  Weight: 67.1 kg 67.1 kg 64.6 kg    Examination:  Gen: Thinly built pleasant male sitting up in bed, AAOx3, no distress HEENT: Neck supple no JVD CVS: S1-S2, regular rate rhythm  Lungs: Few basilar rales, otherwise clear Abdomen: Soft, nontender, bowel sounds present Extremities: No edema right arm AV fistula Skin: no new rashes on exposed skin   Data Reviewed: I have personally reviewed following labs and imaging studies  CBC: Recent Labs  Lab 12/06/19 0751 12/06/19 0751 12/07/19 0240 12/07/19 0240 12/08/19 0601 12/08/19 0821 12/08/19 0822 12/09/19 0456 12/10/19 0627  WBC 8.7  --  8.0  --  8.3  --   --  10.3 9.8  HGB 11.2*   < > 10.4*   < > 10.7* 10.9* 10.5* 10.9* 10.0*  HCT 34.4*   < > 31.4*   < > 33.2* 32.0* 31.0* 33.7* 30.0*  MCV 100.3*  --  101.6*  --  102.2*  --   --  103.1* 102.4*  PLT 280  --  264  --  246  --   --  280 248   < > = values in this interval not displayed.   Basic Metabolic Panel: Recent Labs  Lab 12/04/19 0635 12/04/19 0635 12/05/19 0420 12/05/19 0420 12/05/19 1305 12/06/19 0751 12/06/19 0751 12/07/19 0240 12/08/19 0601 12/08/19 0821 12/08/19 0822 12/08/19 1100 12/10/19 0627  NA 137   < > 139   < >  --  138   < > 139 139 138 138  --  138  K 4.4   < > 3.9   < >  --  3.2*   < > 3.9 4.3 4.3 4.3  --  5.4*  CL 93*   < > 97*  --   --  98  --  101 104  --   --   --  102  CO2 29   < > 25  --   --  25  --  23 22  --   --   --  21*   GLUCOSE 113*   < > 99  --   --  140*  --  93 87  --   --   --  93  BUN 23*   < > 27*  --   --  32*  --  52* 35*  --   --   --  66*  CREATININE 5.44*   < > 4.92*  --   --  5.14*  --  7.00* 5.37*  --   --   --  7.83*  CALCIUM 9.0   < > 9.4  --   --  10.0  --  10.5* 10.1  --   --   --  10.5*  MG 2.1  --   --   --   --  2.2  --   --   --   --   --  2.2  --   PHOS  --   --   --   --  5.7*  --   --   --   --   --   --   --   --    < > = values in this interval not displayed.   GFR: Estimated Creatinine Clearance: 8.3 mL/min (A) (by C-G formula based on SCr of 7.83 mg/dL (H)). Liver Function Tests: Recent Labs  Lab 12/07/19 1336  ALBUMIN 2.6*   No results for input(s): LIPASE, AMYLASE in the last 168 hours. No results for input(s): AMMONIA in the last 168 hours. Coagulation Profile: Recent Labs  Lab 12/10/19 0627  INR 1.1   Cardiac Enzymes: No results for input(s): CKTOTAL, CKMB, CKMBINDEX, TROPONINI in the last 168 hours. BNP (last 3 results) No results for input(s): PROBNP in the last 8760 hours. HbA1C: No results for input(s): HGBA1C in the last 72 hours. CBG: No results for input(s): GLUCAP in the last 168 hours. Lipid Profile: No results for input(s): CHOL, HDL, LDLCALC, TRIG, CHOLHDL, LDLDIRECT in the last 72 hours. Thyroid Function Tests: No results for input(s): TSH, T4TOTAL, FREET4, T3FREE, THYROIDAB in the last 72 hours. Anemia Panel: No results for input(s): VITAMINB12, FOLATE, FERRITIN, TIBC, IRON, RETICCTPCT in the last 72 hours. Sepsis Labs: Recent Labs  Lab 12/05/19 0420 12/07/19 1336 12/08/19 0601 12/09/19 0456  PROCALCITON 3.20 2.97 2.28 2.00    Recent Results (from the past 240 hour(s))  SARS Coronavirus 2 by RT PCR (hospital order, performed in [facility] hospital lab) Nasopharyngeal Nasopharyngeal Swab     Status: None   Collection Time: 12/03/19  4:54 PM   Specimen: Nasopharyngeal Swab  Result Value Ref Range Status   SARS Coronavirus 2 NEGATIVE  NEGATIVE Final    Comment: (NOTE) SARS-CoV-2 target nucleic acids are NOT DETECTED.  The SARS-CoV-2 RNA is generally detectable in upper and lower respiratory specimens during the acute phase of infection. The lowest concentration of SARS-CoV-2 viral copies this assay can detect is 250 copies / mL. A negative result does not preclude SARS-CoV-2 infection and should not be used as the sole basis for treatment or other patient management decisions.  A negative result may occur with improper specimen collection / handling, submission of specimen other than nasopharyngeal swab, presence of viral mutation(s) within the areas targeted by this assay, and inadequate number of viral copies (<250 copies / mL). A negative result must be combined with clinical observations, patient history, and epidemiological information.  Fact Sheet for Patients:   StrictlyIdeas.no  Fact Sheet for Healthcare Providers: BankingDealers.co.za  This test is not yet approved or  cleared by the [place] FDA and has been authorized for detection and/or diagnosis of SARS-CoV-2 by FDA under an Emergency Use Authorization (EUA).  This EUA will remain in effect (meaning this test can be used) for the duration of the COVID-19 declaration under Section 564(b)(1) of the Act, 21 U.S.C. section 360bbb-3(b)(1), unless the authorization is terminated or revoked sooner.  Performed at [facility], [place] [address], [place], [place] [address]   Culture, sputum-assessment     Status: None   Collection Time: 12/04/19 12:10 PM   Specimen: Expectorated Sputum  Result Value Ref Range Status   Specimen Description EXPECTORATED SPUTUM  Final  Special Requests NONE  Final   Sputum evaluation   Final    Sputum specimen not acceptable for testing.  Please recollect.   J,FOKUO RN @2307  12/04/19 EB Performed at [facility] [address] [place], [place] [address]     Report Status 12/04/2019 FINAL  Final  MRSA PCR Screening     Status: None   Collection Time: 12/04/19 11:43 PM   Specimen: Nasopharyngeal  Result Value Ref Range Status   MRSA by PCR NEGATIVE NEGATIVE Final    Comment:        The GeneXpert MRSA Assay (FDA approved for NASAL specimens only), is one component of a comprehensive MRSA colonization surveillance program. It is not intended to diagnose MRSA infection nor to guide or monitor treatment for MRSA infections. Performed at [facility], [place] [address] [place], [place] [address]     Scheduled Meds: . allopurinol  300 mg Oral Daily  . amiodarone  200 mg Oral Daily  . cefdinir  300 mg Oral Q24H  . Chlorhexidine Gluconate Cloth  6 each Topical Q0600  . Chlorhexidine Gluconate Cloth  6 each Topical Q0600  . cinacalcet  180 mg Oral Q T,Th,Sa-HD  . divalproex  250 mg Oral QHS  . feeding supplement (ENSURE ENLIVE)  237 mL Oral BID BM  . guaiFENesin  600 mg Oral BID  . lamoTRIgine  25 mg Oral BID  . levETIRAcetam  500 mg Oral BID  . metoprolol tartrate  12.5 mg Oral QID  . multivitamin  1 tablet Oral QHS  . sodium chloride flush  3 mL Intravenous Q12H  . warfarin  2 mg Oral q1600  . Warfarin - Pharmacist Dosing Inpatient   Does not apply q1600   Continuous Infusions: . sodium chloride 250 mL (12/07/19 2132)  . heparin 1,300 Units/hr (12/10/19 1335)     LOS: 6 days   Time spent: 25 minutes.   Domenic Polite, MD [facility]  12/10/2019, 2:04 PM

## 2019-12-10 NOTE — Progress Notes (Signed)
ANTICOAGULATION CONSULT NOTE - follow up  Pharmacy Consult : Restart IV heparin 12/09/19 Indication: atrial fibrillation  Allergies  Allergen Reactions  . Diphenhydramine Hcl Palpitations    Patient Measurements: Height: 5\' 3"  (160 cm) Weight: 64.6 kg (142 lb 6.7 oz) IBW/kg (Calculated) : 56.9 Heparin Dosing Weight: 64.6 kg  Vital Signs: Temp: 98.1 F (36.7 C) (09/16 1155) Temp Source: Oral (09/16 1155) BP: 111/63 (09/16 1329) Pulse Rate: 52 (09/16 1329)  Labs: Recent Labs    12/08/19 0601 12/08/19 0601 12/08/19 0821 12/08/19 0822 12/09/19 0456 12/09/19 2153 12/10/19 0627 12/10/19 1438  HGB 10.7*   < >   < > 10.5* 10.9*  --  10.0*  --   HCT 33.2*  --    < > 31.0* 33.7*  --  30.0*  --   PLT 246  --   --   --  280  --  248  --   LABPROT  --   --   --   --   --   --  13.5  --   INR  --   --   --   --   --   --  1.1  --   HEPARINUNFRC 0.46   < >  --   --  <0.10* <0.10*  --  0.62  CREATININE 5.37*  --   --   --   --   --  7.83*  --    < > = values in this interval not displayed.   Assessment:  59 yr old male to begin IV heparin for atrial fibrillation. Hx PAF but no anticoagulation PTA due to hx bleeding episodes from HD cath site while on Warfarin in the past.  Patient now s/p Cath done on 12/08/19 Dr. Doylene Canard started Warfarin today 9/15 and Dr. Broadus John requested pharmacy to monitor warfarin and resume IV heparin drip on 9/15.  Noted that patient has past history of bleeding episodes from HD cath site in past while on Warfarin for which warfarin was stopped at that time. I discussed  with Dr. Doylene Canard the possible need for lovenox bridge as outpatient.  Dr. Doylene Canard said 1-2mg  warfarin without bridging is fine with him and he plans to check INR in his  office Monday and Thursday till desired INR is close to 2.0.   Goal INR per Dr. Doylene Canard is 1.8-2.0   No bleeding reported per RN assessment. Noted that patient has past history of bleeding episodes from HD cath site in past  while on Warfarin for which warfarin was stopped at that time.  Dr. Doylene Canard noted patient willing to try warfarin 2mg .   Goal of Therapy:   INR 1.8-2.0 per Dr. Doylene Canard due to h/o bleeding in past (HD site) Heparin level 0.3-0.7 units/ml Monitor platelets by anticoagulation protocol: Yes   Plan:  Decrease IV heparin drip to 1250 units/hr  Give Warfarin 2 mg today . INR goal is  1.8-2.0 per Dr. Doylene Canard due to h/o bleeding from HD cath site in past Daily HL, INR and CBC  coumadin education booklet and video ordered.   Nicole Cella, RPh Clinical Pharmacist [place] Please utilize Amion for appropriate phone number to reach the unit pharmacist ([place])  12/10/2019

## 2019-12-10 NOTE — Progress Notes (Signed)
[facility] Progress Note   Subjective:   Pt seen on HD, no concerns today. Denies SOB, CP, palpitations, dizziness, abdominal pain, N/V/D.  Objective Vitals:   12/09/19 0512 12/09/19 0950 12/09/19 2109 12/10/19 0515  BP: 118/81 112/72 103/73 115/72  Pulse: 91 96 66 87  Resp: 17 18 18 18   Temp: 97.8 F (36.6 C) 98.1 F (36.7 C) 98 F (36.7 C) 97.6 F (36.4 C)  TempSrc: Oral Oral Oral Oral  SpO2: 93% 96% 95% 93%  Weight: 67.1 kg     Height:       Physical Exam General: Well developed, alert, in NAD Heart: RRR, no murmurs, rubs or gallops Lungs: CTA bilaterally without wheezing, rhonchi or rales Abdomen: Soft, non-tender, non-distended, +BS Extremities: No edema b/l lower extremities Dialysis Access:  LUE AVF accessed  Additional Objective Labs: Basic Metabolic Panel: Recent Labs  Lab 12/05/19 1305 12/06/19 0751 12/07/19 0240 12/07/19 0240 12/08/19 0601 12/08/19 0601 12/08/19 0821 12/08/19 0822 12/10/19 0627  NA  --    < > 139   < > 139   < > 138 138 138  K  --    < > 3.9   < > 4.3   < > 4.3 4.3 5.4*  CL  --    < > 101  --  104  --   --   --  102  CO2  --    < > 23  --  22  --   --   --  21*  GLUCOSE  --    < > 93  --  87  --   --   --  93  BUN  --    < > 52*  --  35*  --   --   --  66*  CREATININE  --    < > 7.00*  --  5.37*  --   --   --  7.83*  CALCIUM  --    < > 10.5*  --  10.1  --   --   --  10.5*  PHOS 5.7*  --   --   --   --   --   --   --   --    < > = values in this interval not displayed.   Liver Function Tests: Recent Labs  Lab 12/07/19 1336  ALBUMIN 2.6*   No results for input(s): LIPASE, AMYLASE in the last 168 hours. CBC: Recent Labs  Lab 12/06/19 0751 12/06/19 0751 12/07/19 0240 12/07/19 0240 12/08/19 0601 12/08/19 0821 12/08/19 0822 12/09/19 0456 12/10/19 0627  WBC 8.7   < > 8.0   < > 8.3  --   --  10.3 9.8  HGB 11.2*   < > 10.4*   < > 10.7*   < > 10.5* 10.9* 10.0*  HCT 34.4*   < > 31.4*   < > 33.2*   < > 31.0*  33.7* 30.0*  MCV 100.3*  --  101.6*  --  102.2*  --   --  103.1* 102.4*  PLT 280   < > 264   < > 246  --   --  280 248   < > = values in this interval not displayed.   Blood Culture    Component Value Date/Time   SDES EXPECTORATED SPUTUM 12/04/2019 1210   SPECREQUEST NONE 12/04/2019 1210   REPTSTATUS 12/04/2019 FINAL 12/04/2019 1210    Medications: . sodium chloride 250 mL (12/07/19 2132)  .  sodium chloride    . heparin 1,300 Units/hr (12/09/19 2315)   . allopurinol  300 mg Oral Daily  . amiodarone  200 mg Oral Daily  . cefdinir  300 mg Oral Q24H  . Chlorhexidine Gluconate Cloth  6 each Topical Q0600  . Chlorhexidine Gluconate Cloth  6 each Topical Q0600  . cinacalcet  180 mg Oral Q T,Th,Sa-HD  . divalproex  250 mg Oral QHS  . feeding supplement (ENSURE ENLIVE)  237 mL Oral BID BM  . guaiFENesin  600 mg Oral BID  . lamoTRIgine  25 mg Oral BID  . levETIRAcetam  500 mg Oral BID  . metoprolol tartrate  12.5 mg Oral QID  . multivitamin  1 tablet Oral QHS  . sodium chloride flush  3 mL Intravenous Q12H  . sodium chloride flush  3 mL Intravenous Q12H  . sodium chloride flush  3 mL Intravenous Q12H  . warfarin  2 mg Oral q1600  . Warfarin - Pharmacist Dosing Inpatient   Does not apply q1600    Dialysis Orders: [facility] T,Th,S 4 hours 180 NRe 400/800 69.5 kg 2.0 K/ 2.0 Ca AVG -Heparin 6800 units IV TIW -Sensipar 180 mg PO TIW -Calcitriol 1.0 mcg PO TIW (last PTH 744 11/05/2019)  Assessment/Plan: 1. Pulmonary edema/Volume overload: Has been leaving under EDW at OP center, H/O HFrEF.CXR significantly improved with serial HD.S/p cardiac cath 9/14. Now off supplemental O2. Will get standing weights today to establish new EDW.  2. A. Fib-Per primary/cardiology. On heparin drip, amiodarone and metoprolol. Keep potassium >4. 3. Possible PNA-ABX per primary- doxycycline and cefepime. 4. ESRD -usual HD TTS. Serial HD this admission for volume. Resume TTS  schedule 5. Hypertension/volume- As noted above.BP is controlled(on metoprolol for a.fib). 6. Anemia- HGB 10.0.Start ESA if declines further. 7. Metabolic bone disease-Chronic issues with hyperphosphatemia as OP.Corrected calcium running high.No contributory meds on list.Hold calcitriol, continue sensipar. Last PTH was 664 on 9/9, may need to increase sensipar dose (pt agreeable to take on non-HD days but will need to find out if this is covered outpatient).SPEP and SFLC pending. Phos close to goal- 5.7.  8. Nutrition- Renal diet with fluid restrictions.  8. H/O Afib. On amiodarone, metoprolol. Cardiology starting low dose warfarin.  9. H/O HFrEF. Repeat ECHOEF down 20-25% (was 30-35%).Dr. Audria Nine, PA-C 12/10/2019, 8:14 AM  [facility] Pager: [phone]

## 2019-12-11 LAB — CBC
HCT: 31.8 % — ABNORMAL LOW (ref 39.0–52.0)
Hemoglobin: 10.5 g/dL — ABNORMAL LOW (ref 13.0–17.0)
MCH: 33.7 pg (ref 26.0–34.0)
MCHC: 33 g/dL (ref 30.0–36.0)
MCV: 101.9 fL — ABNORMAL HIGH (ref 80.0–100.0)
Platelets: 267 10*3/uL (ref 150–400)
RBC: 3.12 MIL/uL — ABNORMAL LOW (ref 4.22–5.81)
RDW: 14.1 % (ref 11.5–15.5)
WBC: 9.5 10*3/uL (ref 4.0–10.5)
nRBC: 0 % (ref 0.0–0.2)

## 2019-12-11 LAB — BASIC METABOLIC PANEL
Anion gap: 13 (ref 5–15)
BUN: 33 mg/dL — ABNORMAL HIGH (ref 6–20)
CO2: 25 mmol/L (ref 22–32)
Calcium: 9.6 mg/dL (ref 8.9–10.3)
Chloride: 96 mmol/L — ABNORMAL LOW (ref 98–111)
Creatinine, Ser: 5.74 mg/dL — ABNORMAL HIGH (ref 0.61–1.24)
GFR calc Af Amer: 12 mL/min — ABNORMAL LOW (ref >60)
GFR calc non Af Amer: 10 mL/min — ABNORMAL LOW (ref >60)
Glucose, Bld: 139 mg/dL — ABNORMAL HIGH (ref 70–99)
Potassium: 4.2 mmol/L (ref 3.5–5.1)
Sodium: 134 mmol/L — ABNORMAL LOW (ref 135–145)

## 2019-12-11 LAB — PROTIME-INR
INR: 1.2 (ref 0.8–1.2)
Prothrombin Time: 14.6 seconds (ref 11.4–15.2)

## 2019-12-11 LAB — IMMUNOFIXATION ELECTROPHORESIS
IgA: 196 mg/dL (ref 90–386)
IgG (Immunoglobin G), Serum: 492 mg/dL — ABNORMAL LOW (ref 603–1613)
IgM (Immunoglobulin M), Srm: 20 mg/dL (ref 20–172)
Total Protein ELP: 5 g/dL — ABNORMAL LOW (ref 6.0–8.5)

## 2019-12-11 LAB — HEPARIN LEVEL (UNFRACTIONATED): Heparin Unfractionated: 0.44 IU/mL (ref 0.30–0.70)

## 2019-12-11 MED ORDER — WARFARIN SODIUM 2 MG PO TABS
2.0000 mg | ORAL_TABLET | Freq: Every day | ORAL | 0 refills | Status: DC
Start: 2019-12-11 — End: 2023-01-08

## 2019-12-11 MED ORDER — WARFARIN SODIUM 2 MG PO TABS: 2.0000 mg | ORAL_TABLET | Freq: Once | ORAL | Status: DC

## 2019-12-11 MED ORDER — ACETAMINOPHEN 325 MG PO TABS: 650.0000 mg | ORAL_TABLET | Freq: Four times a day (QID) | ORAL | Status: DC | PRN

## 2019-12-11 MED ORDER — NEPRO/CARBSTEADY PO LIQD
237.0000 mL | ORAL | Status: DC
  Administered 2019-12-11: 237 mL via ORAL

## 2019-12-11 NOTE — Progress Notes (Signed)
Nutrition Follow-up  DOCUMENTATION CODES:   Not applicable  INTERVENTION:  D/c Ensure  Nepro Shake po daily, each supplement provides 425 kcal and 19 grams protein  Continue Rena-vit daily   NUTRITION DIAGNOSIS:   Increased nutrient needs related to chronic illness (ESRD on HD) as evidenced by estimated needs.  Ongoing  GOAL:   Patient will meet greater than or equal to 90% of their needs  Progressing  MONITOR:   PO intake, Supplement acceptance, Weight trends, Labs, I & O's  REASON FOR ASSESSMENT:   Consult Assessment of nutrition requirement/status  ASSESSMENT:   Pt admitted with acute hypoxic respiratory failure 2/2 acute systolic heart failure exacerbation and/or PNA.  PMH includes ESRD on HD, cardiomyopathy, seizure disorder, Afib, h/o congenital hypotrophic kidneys, S/P failed renal transplant.  9/14 underwent L heart catheterization which noted nonobstructive-coronary disease  Pt's appetite has significantly improved and pt is consuming supplements.  PO intake:  75-100% x last 8 recorded meals (89% average meal intake)  EDW 69.5 kg Current wt 67.3 kg Last HD 9/16, net UF 1561ml  Labs: K+ 5.4 (H) Medications: Sensipar, Ensure Enlive BID, Rena-vit, Coumadin  Diet Order:   Diet Order            Diet renal/carb modified with fluid restriction Diet-HS Snack? Nothing; Fluid restriction: 1200 mL Fluid; Room service appropriate? Yes; Fluid consistency: Thin  Diet effective now                 EDUCATION NEEDS:   No education needs have been identified at this time  Skin:  Skin Assessment: Reviewed RN Assessment  Last BM:  9/11 type 6  Height:   Ht Readings from Last 1 Encounters:  12/04/19 5\' 3"  (1.6 m)    Weight:   Wt Readings from Last 1 Encounters:  12/10/19 67.3 kg    BMI:  Body mass index is 26.28 kg/m.  Estimated Nutritional Needs:   Kcal:  2100-2300  Protein:  105-115 grams  Fluid:  101ml + UOP    Larkin Ina, MS,  RD, LDN RD pager number and weekend/on-call pager number located in [place].

## 2019-12-11 NOTE — Care Management Important Message (Signed)
Important Message  Patient Details  Name: Peter Sloan MRN: [id number] Date of Birth: 1960/08/25   Medicare Important Message Given:  Yes - Important Message mailed due to current National Emergency  Verbal consent obtained due to current National Emergency  Relationship to patient: Self Contact Name: Tevin Shillingford Call Date: 12/11/19  Time: 1239 Phone: [phone] Outcome: No Answer/Busy Important Message mailed to: Patient address on file    Delorse Lek 12/11/2019, 12:39 PM

## 2019-12-11 NOTE — Progress Notes (Signed)
[facility] Progress Note   Subjective: DC orders noted. No C/Os. Will need lower EDW on discharge.    Objective Vitals:   12/10/19 1646 12/10/19 2202 12/11/19 0537 12/11/19 0928  BP: 99/80 102/66 100/71 105/78  Pulse: 96 88 84 84  Resp: 18 (!) 22 20 20   Temp: 98.1 F (36.7 C) 98.1 F (36.7 C) 98.2 F (36.8 C) (!) 97.5 F (36.4 C)  TempSrc:    Oral  SpO2: 96% 90% 94% 96%  Weight:  67.3 kg    Height:       Physical Exam General: Pleasant, NAD Heart: S1,S2 no M/R/G. SR on monitor. Lungs: CTAB Abdomen: Active BS Extremities: No LE Edema Dialysis Access: L AVF bruit stronger in proximal portion of AVF than distal. May need F'gram as OP.     Additional Objective Labs: Basic Metabolic Panel: Recent Labs  Lab 12/05/19 1305 12/06/19 0751 12/08/19 0601 12/08/19 0821 12/08/19 0822 12/10/19 0627 12/11/19 0755  NA  --    < > 139   < > 138 138 134*  K  --    < > 4.3   < > 4.3 5.4* 4.2  CL  --    < > 104  --   --  102 96*  CO2  --    < > 22  --   --  21* 25  GLUCOSE  --    < > 87  --   --  93 139*  BUN  --    < > 35*  --   --  66* 33*  CREATININE  --    < > 5.37*  --   --  7.83* 5.74*  CALCIUM  --    < > 10.1  --   --  10.5* 9.6  PHOS 5.7*  --   --   --   --   --   --    < > = values in this interval not displayed.   Liver Function Tests: Recent Labs  Lab 12/07/19 1336  ALBUMIN 2.6*   No results for input(s): LIPASE, AMYLASE in the last 168 hours. CBC: Recent Labs  Lab 12/07/19 0240 12/07/19 0240 12/08/19 0601 12/08/19 0821 12/09/19 0456 12/10/19 0627 12/11/19 0755  WBC 8.0   < > 8.3   < > 10.3 9.8 9.5  HGB 10.4*   < > 10.7*   < > 10.9* 10.0* 10.5*  HCT 31.4*   < > 33.2*   < > 33.7* 30.0* 31.8*  MCV 101.6*  --  102.2*  --  103.1* 102.4* 101.9*  PLT 264   < > 246   < > 280 248 267   < > = values in this interval not displayed.   Blood Culture    Component Value Date/Time   SDES EXPECTORATED SPUTUM 12/04/2019 1210   SPECREQUEST NONE  12/04/2019 1210   REPTSTATUS 12/04/2019 FINAL 12/04/2019 1210    Cardiac Enzymes: No results for input(s): CKTOTAL, CKMB, CKMBINDEX, TROPONINI in the last 168 hours. CBG: No results for input(s): GLUCAP in the last 168 hours. Iron Studies: No results for input(s): IRON, TIBC, TRANSFERRIN, FERRITIN in the last 72 hours. @lablastinr3 @ Studies/Results: No results found. Medications:  sodium chloride 250 mL (12/07/19 2132)    allopurinol  300 mg Oral Daily   amiodarone  200 mg Oral Daily   cefdinir  300 mg Oral Q24H   Chlorhexidine Gluconate Cloth  6 each Topical Q0600   Chlorhexidine Gluconate Cloth  6  each Topical Q0600   cinacalcet  180 mg Oral Q T,Th,Sa-HD   divalproex  250 mg Oral QHS   feeding supplement (NEPRO CARB STEADY)  237 mL Oral Q24H   guaiFENesin  600 mg Oral BID   lamoTRIgine  25 mg Oral BID   levETIRAcetam  500 mg Oral BID   metoprolol tartrate  12.5 mg Oral QID   multivitamin  1 tablet Oral QHS   sodium chloride flush  3 mL Intravenous Q12H   warfarin  2 mg Oral q1600   warfarin   Does not apply Once   Warfarin - Pharmacist Dosing Inpatient   Does not apply q1600     Dialysis Orders: [facility] T,Th,S 4 hours 180 NRe 400/800 69.5 kg 2.0 K/ 2.0 Ca AVG -Heparin 6800 units IV TIW -Sensipar 180 mg PO TIW -Calcitriol 1.0 mcg PO TIW (last PTH 744 11/05/2019)  Assessment/Plan: 1. Pulmonary edema/Volume overload: Has been leaving under EDW at OP center, H/O HFrEF.CXR significantly improved with serial HD.S/p cardiac cath 9/14. Now off supplemental O2. Will get standing weights today to establish new EDW.  2. A. Fib-Per primary/cardiology. On heparin drip, amiodarone and metoprolol. Keep potassium >4. 3. Possible PNA-ABX per primary- doxycycline and cefepime now de-escalated to cefdinir per primary.  4. ESRD -usual HD TTS. Serial HD this admission for volume removal. Next HD 09/18 at OP center.  5. Hypertension/volume- As noted above.BP  is controlled(on metoprolol for a.fib). 6. Anemia- HGB 10.0.Start ESA if declines further. 7. Metabolic bone disease-Chronic issues with hyperphosphatemia as OP.Corrected calcium running high.No contributory meds on list.Hold calcitriol, continue sensipar. Last PTH was 664 on 9/9, may need to increase sensipar dose(pt agreeable to take on non-HD days but will need to find out if this is covered outpatient).SPEP and SFLC pending.Phos close to goal- 5.7.  8. Nutrition- Renal diet with fluid restrictions.  8. H/O Afib. On amiodarone, metoprolol. Cardiology starting low dose warfarin. 9. H/O HFrEF. Repeat ECHOEF down 20-25% (was 30-35%).Dr. Donato Heinz H. Ramia Sidney NP-C 12/11/2019, 10:40 AM  [employer] [phone]

## 2019-12-11 NOTE — Plan of Care (Signed)
  Problem: Education: Goal: Knowledge of General Education information will improve Description Including pain rating scale, medication(s)/side effects and non-pharmacologic comfort measures Outcome: Progressing   Problem: Health Behavior/Discharge Planning: Goal: Ability to manage health-related needs will improve Outcome: Progressing   

## 2019-12-11 NOTE — Discharge Instructions (Signed)

## 2019-12-11 NOTE — Discharge Summary (Signed)
Physician Discharge Summary  Peter Sloan QVZ:[id number] DOB: October 06, 1960 DOA: 12/03/2019  PCP: Mauricia Area, MD  Admit date: 12/03/2019 Discharge date: 12/11/2019  Time spent: 35 minutes  Recommendations for Outpatient Follow-up:  1. PCP in 1 week 2. INR check at Dr. Merrilee Jansky office on Monday 9/21 3. Abnormal kappa/lambda light chains however SPEP is nonspecific, consider repeating this in 1 to 2 months   Discharge Diagnoses:  Principal Problem: Acute respiratory failure with hypoxia (HCC) Fluid overload Community-acquired pneumonia Hypokalemia Seizure disorder (HCC) End stage renal disease on dialysis (HCC) Paroxysmal atrial fibrillation (HCC) Atrial fibrillation with rapid ventricular response Severe mitral regurgitation Nonischemic cardiomyopathy Anemia of chronic disease Moderate protein calorie malnutrition   Discharge Condition: Stable  Diet recommendation: Renal  Filed Weights   12/10/19 0729 12/10/19 1155 12/10/19 2202  Weight: 67.1 kg 64.6 kg 67.3 kg    History of present illness:  59 year old with past medical history significant for ESRD on hemodialysis, cardiomyopathy with ejection fraction 30 to 35%, seizure disorder, paroxysmal atrial fibrillation not on anticoagulation, presented to ED w/ worsening shortness of breath and cough for the last 1 or 2 weeks, productive cough. -in ED, hypoxic-sats were 85 on room air, tachypneic, chest x-ray with widespread bilateral groundglass airspace disease suggestive of multifocal pneumonia or edema.   Hospital Course:   Acute Hypoxic Respiratory Failure secondary to Acute systolic heart failure exacerbation and pneumonia: -Patient presented with hypoxemia, dyspnea, chest x-ray asymmetric edema versus pneumonia. Prior EF 35 %.  COVID-19 PCR was negative -Symptoms were felt to be secondary to fluid overload and pneumonia -Nephrology consulted, hemodialysis resumed, excess fluid pulled off -Procalcitonin was  greater than 3, treated with IV antibiotics initially and then transitioned to oral cefdinir, completed 7-day course of antibiotics. -Weaned off oxygen -Clinically stable and improved at this time, discharged home in a stable condition advised follow-up with PCP in 1 week  Acute systolic heart failure exacerbation, paroxysmal A. fib:  -Cardiology consulted. Echo with lower EF at 20%, Severe mitral regurgitation.  -Underwent left heart cath 9/14, nonobstructive CAD -Volume managed with HD -Continue metoprolol -Follow-up with Dr. Doylene Canard next week, for this and A. fib  Hyperkalemia:  -Corrected with hemodialysis  ESRD: -Nephrology following, dialysis continued  Paroxysmal A. Fib: -Cardiology consulted. -A fib RVR morning of 9/12,  -Now in sinus rhythm -Continue low-dose oral amiodarone and metoprolol -Started on anticoagulation with Coumadin, per cardiology no need for bridge at the time of discharge, patient will continue Coumadin 2 mg and have INR check on Monday at Dr. Merrilee Jansky office 9/21  Mild elevation of troponin: Related to demand ischemia in the setting of heart failure exacerbation. -Left heart cath with nonobstructive CAD, stopped aspirin in the setting of ongoing warfarin use  Seizure disorder: Continue with Keppra and Depakote.  Constipation; -Improved with laxatives   Moderate protein calorie malnutrition -Supplements ordered  Nodular opacity left base -Noted on x-ray, needs follow-up  Abnormal SPEP -elevated Kappa and lambda free light chains -likely non specific, SPEP nonspecific, did not show monoclonal spike -d/w oncology, Dr. Burr Medico, who suspected this was nonspecific,  -Recommend rechecking this in 1 to 2 months  Moderate protein calorie malnutrition -Temporal wasting -Supplements added   Consultants:   Nephrology  Cardiology  Procedures:   Left heart catheterization: Minimal, nonobstructive coronary artery disease  Discharge  Exam: Vitals:   12/11/19 0537 12/11/19 0928  BP: 100/71 105/78  Pulse: 84 84  Resp: 20 20  Temp: 98.2 F (36.8 C) (!) 97.5 F (36.4  C)  SpO2: 94% 96%    General: Awake alert oriented x3 Cardiovascular: S1-S2, regular rhythm, pansystolic murmur Respiratory: Few scattered rhonchi, improved air movement  Discharge Instructions   Discharge Instructions    Discharge instructions   Complete by: As directed    Renal Diet   Increase activity slowly   Complete by: As directed      Allergies as of 12/11/2019      Reactions   Diphenhydramine Hcl Palpitations      Medication List    STOP taking these medications   aspirin 81 MG tablet     TAKE these medications   acetaminophen 325 MG tablet Commonly known as: TYLENOL Take 2 tablets (650 mg total) by mouth every 6 (six) hours as needed for mild pain (or Fever >/= 101).   allopurinol 300 MG tablet Commonly known as: ZYLOPRIM Take 300 mg by mouth daily.   amiodarone 200 MG tablet Commonly known as: PACERONE Take 200 mg by mouth daily.   Auryxia 1 GM 210 MG(Fe) tablet Generic drug: ferric citrate Take 420 mg by mouth in the morning and at bedtime.   b complex-vitamin c-folic acid 0.8 MG Tabs tablet Take 0.8 mg by mouth daily.   divalproex 250 MG 24 hr tablet Commonly known as: DEPAKOTE ER Take 1 tablet (250 mg total) by mouth daily. What changed: when to take this   lamoTRIgine 25 MG tablet Commonly known as: LAMICTAL Take 1 tablet (25 mg total) by mouth 2 (two) times daily.   levETIRAcetam 500 MG tablet Commonly known as: KEPPRA Take 1 tablet (500 mg total) by mouth 2 (two) times daily.   metoprolol tartrate 25 MG tablet Commonly known as: LOPRESSOR Take 25 mg by mouth every evening.   Sensipar 60 MG tablet Generic drug: cinacalcet Take 180 mg by mouth See admin instructions. Take one tablet by mouth on Tuesdays, Thursdays, Saturdays   vitamin C 1000 MG tablet Take 1,000 mg by mouth daily.   VITAMIN D  PO Take 1 tablet by mouth See admin instructions. Take one tablet by mouth on Tuesdays, Thursdays, Saturdays   warfarin 2 MG tablet Commonly known as: COUMADIN Take 1 tablet (2 mg total) by mouth daily at 4 PM. Take 2mg  daily until FU with Dr.Kadakia and INR check on Monday 9/20            [place]  (From admission, onward)         Start     Ordered   12/07/19 1608  For home use only DME oxygen  Once       Question Answer Comment  Length of Need 6 Months   Mode or (Route) Nasal cannula   Liters per Minute 3   Frequency Continuous (stationary and portable oxygen unit needed)   Oxygen delivery system Gas      12/07/19 1607         Allergies  Allergen Reactions  . Diphenhydramine Hcl Palpitations      The results of significant diagnostics from this hospitalization (including imaging, microbiology, ancillary and laboratory) are listed below for reference.    Significant Diagnostic Studies: DG Chest 2 View  Result Date: 12/06/2019 CLINICAL DATA:  Follow-up CHF versus multifocal pneumonia. EXAM: CHEST - 2 VIEW COMPARISON:  12/03/2019 and earlier. FINDINGS: AP ERECT and LATERAL images were obtained. Cardiac silhouette moderately enlarged, unchanged. Thoracic aorta mildly tortuous and atherosclerotic, unchanged. Marked improvement in aeration throughout both lungs since the examination 3 days ago, though patchy  ground-glass airspace opacities persist. A more focal nodular appearing opacity is present at the LEFT lung base, likely in the lingula. Small BILATERAL pleural effusions are unchanged. No new pulmonary parenchymal abnormalities. Degenerative changes throughout the thoracic spine and thoracic dextroscoliosis and lumbar levoscoliosis as noted previously. IMPRESSION: 1. Marked improvement in aeration throughout both lungs since the examination 3 days ago, though patchy ground-glass airspace opacities persist, indicating improving edema versus pneumonia. 2.  Residual nodular opacity at the LEFT lung base, likely in the lingula. Follow-up chest x-ray to resolution of the acute CHF versus pneumonia is recommended to be sure that this opacity resolves. 3. Stable small BILATERAL pleural effusions. 4. No new abnormalities. Electronically Signed   By: Evangeline Dakin M.D.   On: 12/06/2019 09:16   CARDIAC CATHETERIZATION  Result Date: 12/08/2019  Prox RCA to Mid RCA lesion is 25% stenosed.  Prox LAD to Mid LAD lesion is 25% stenosed.  1st Diag lesion is 30% stenosed.  LV end diastolic pressure is normal.  Medical treatment.   DG Chest Portable 1 View  Result Date: 12/03/2019 CLINICAL DATA:  Short of breath EXAM: PORTABLE CHEST 1 VIEW COMPARISON:  01/12/2012 FINDINGS: Single frontal view of the chest demonstrates an unremarkable cardiac silhouette. There is widespread bilateral ground-glass airspace disease. Chronic blunting of the left costophrenic angle consistent with pleural thickening or scarring. No pneumothorax. IMPRESSION: 1. Widespread bilateral ground-glass airspace disease consistent with multifocal pneumonia or edema. Electronically Signed   By: Randa Ngo M.D.   On: 12/03/2019 17:13   ECHOCARDIOGRAM COMPLETE  Result Date: 12/04/2019    ECHOCARDIOGRAM REPORT   Patient Name:   Peter Sloan [facility] Date of Exam: 12/04/2019 Medical Rec #:  [id number]          Height:       63.0 in Accession #:    [id number]         Weight:       152.0 lb Date of Birth:  [birth year]          BSA:          1.721 m Patient Age:    60 years           BP:           117/94 mmHg Patient Gender: M                  HR:           90 bpm. Exam Location:  Inpatient Procedure: 2D Echo, Cardiac Doppler, Color Doppler and Intracardiac            Opacification Agent Indications:    CHF  History:        Patient has no prior history of Echocardiogram examinations. CHF                 and Cardiomyopathy, Arrythmias:Atrial Fibrillation,                 Signs/Symptoms:Shortness of Breath;  Risk Factors:Dyslipidemia.                 ESRD, kidney transplant.  Sonographer:    Dustin Flock RDCS Referring Phys: [place]  1. Left ventricular ejection fraction, by estimation, is 20 to 25%. The left ventricle has severely decreased function. The left ventricle demonstrates global hypokinesis. The left ventricular internal cavity size was mildly dilated. Left ventricular diastolic parameters were normal.  2. Right ventricular systolic function is moderately reduced. The right ventricular size  is normal. There is mildly elevated pulmonary artery systolic pressure.  3. Left atrial size was severely dilated.  4. Right atrial size was moderately dilated.  5. The mitral valve is degenerative. Severe mitral valve regurgitation. No evidence of mitral stenosis.  6. The aortic valve is tricuspid. Aortic valve regurgitation is not visualized.  7. The inferior vena cava is dilated in size with <50% respiratory variability, suggesting right atrial pressure of 15 mmHg. FINDINGS  Left Ventricle: Left ventricular ejection fraction, by estimation, is 20 to 25%. The left ventricle has severely decreased function. The left ventricle demonstrates global hypokinesis. Definity contrast agent was given IV to delineate the left ventricular endocardial borders. The left ventricular internal cavity size was mildly dilated. There is no left ventricular hypertrophy. Left ventricular diastolic parameters were normal. Right Ventricle: The right ventricular size is normal. No increase in right ventricular wall thickness. Right ventricular systolic function is moderately reduced. There is mildly elevated pulmonary artery systolic pressure. The tricuspid regurgitant velocity is 2.70 m/s, and with an assumed right atrial pressure of 15 mmHg, the estimated right ventricular systolic pressure is 62.8 mmHg. Left Atrium: Left atrial size was severely dilated. Right Atrium: Right atrial size was moderately dilated.  Pericardium: There is no evidence of pericardial effusion. Mitral Valve: The mitral valve is degenerative in appearance. There is mild thickening of the mitral valve leaflet(s). There is mild calcification of the mitral valve leaflet(s). Mild mitral annular calcification. Severe mitral valve regurgitation. No evidence of mitral valve stenosis. Tricuspid Valve: The tricuspid valve is normal in structure. Tricuspid valve regurgitation is mild. Aortic Valve: The aortic valve is tricuspid. Aortic valve regurgitation is not visualized. Pulmonic Valve: The pulmonic valve was normal in structure. Pulmonic valve regurgitation is trivial. Aorta: The aortic root is normal in size and structure. There is minimal (Grade I) atheroma plaque involving the aortic root and ascending aorta. Venous: The inferior vena cava is dilated in size with less than 50% respiratory variability, suggesting right atrial pressure of 15 mmHg. IAS/Shunts: The interatrial septum was not assessed.  LEFT VENTRICLE PLAX 2D LVIDd:         5.80 cm  Diastology LVIDs:         5.50 cm  LV e' medial:    5.22 cm/s LV PW:         1.00 cm  LV E/e' medial:  17.1 LV IVS:        1.00 cm  LV e' lateral:   4.79 cm/s LVOT diam:     2.00 cm  LV E/e' lateral: 18.6 LV SV:         45 LV SV Index:   26 LVOT Area:     3.14 cm  RIGHT VENTRICLE RV Basal diam:  4.30 cm RV S prime:     9.25 cm/s TAPSE (M-mode): 2.4 cm LEFT ATRIUM              Index       RIGHT ATRIUM           Index LA diam:        4.60 cm  2.67 cm/m  RA Area:     18.70 cm LA Vol (A2C):   105.0 ml 61.02 ml/m RA Volume:   52.80 ml  30.68 ml/m LA Vol (A4C):   61.7 ml  35.86 ml/m LA Biplane Vol: 81.2 ml  47.19 ml/m  AORTIC VALVE LVOT Vmax:   93.80 cm/s LVOT Vmean:  53.400 cm/s LVOT VTI:  0.142 m  AORTA Ao Root diam: 3.10 cm MITRAL VALVE               TRICUSPID VALVE MV Area (PHT): 5.13 cm    TR Peak grad:   29.2 mmHg MV Decel Time: 148 msec    TR Vmax:        270.00 cm/s MV E velocity: 89.20 cm/s MV A  velocity: 70.80 cm/s  SHUNTS MV E/A ratio:  1.26        Systemic VTI:  0.14 m                            Systemic Diam: 2.00 cm Dixie Dials MD Electronically signed by Dixie Dials MD Signature Date/Time: 12/04/2019/6:02:40 PM    Final     Microbiology: Recent Results (from the past 240 hour(s))  SARS Coronavirus 2 by RT PCR (hospital order, performed in [place] hospital lab) Nasopharyngeal Nasopharyngeal Swab     Status: None   Collection Time: 12/03/19  4:54 PM   Specimen: Nasopharyngeal Swab  Result Value Ref Range Status   SARS Coronavirus 2 NEGATIVE NEGATIVE Final    Comment: (NOTE) SARS-CoV-2 target nucleic acids are NOT DETECTED.  The SARS-CoV-2 RNA is generally detectable in upper and lower respiratory specimens during the acute phase of infection. The lowest concentration of SARS-CoV-2 viral copies this assay can detect is 250 copies / mL. A negative result does not preclude SARS-CoV-2 infection and should not be used as the sole basis for treatment or other patient management decisions.  A negative result may occur with improper specimen collection / handling, submission of specimen other than nasopharyngeal swab, presence of viral mutation(s) within the areas targeted by this assay, and inadequate number of viral copies (<250 copies / mL). A negative result must be combined with clinical observations, patient history, and epidemiological information.  Fact Sheet for Patients:   StrictlyIdeas.no  Fact Sheet for Healthcare Providers: BankingDealers.co.za  This test is not yet approved or  cleared by the [place] FDA and has been authorized for detection and/or diagnosis of SARS-CoV-2 by FDA under an Emergency Use Authorization (EUA).  This EUA will remain in effect (meaning this test can be used) for the duration of the COVID-19 declaration under Section 564(b)(1) of the Act, 21 U.S.C. section 360bbb-3(b)(1), unless  the authorization is terminated or revoked sooner.  Performed at [facility], [place] [address] [place], [place] [address]   Culture, sputum-assessment     Status: None   Collection Time: 12/04/19 12:10 PM   Specimen: Expectorated Sputum  Result Value Ref Range Status   Specimen Description EXPECTORATED SPUTUM  Final   Special Requests NONE  Final   Sputum evaluation   Final    Sputum specimen not acceptable for testing.  Please recollect.   J,FOKUO RN @[phone]  12/04/19 EB Performed at [facility] [address] [place], [place] [address]    Report Status 12/04/2019 FINAL  Final  MRSA PCR Screening     Status: None   Collection Time: 12/04/19 11:43 PM   Specimen: Nasopharyngeal  Result Value Ref Range Status   MRSA by PCR NEGATIVE NEGATIVE Final    Comment:        The GeneXpert MRSA Assay (FDA approved for NASAL specimens only), is one component of a comprehensive MRSA colonization surveillance program. It is not intended to diagnose MRSA infection nor to guide or monitor treatment for  MRSA infections. Performed at [facility], [place] [address], [place], [place] [address]      Labs: Basic Metabolic Panel: Recent Labs  Lab 12/05/19 0420 12/05/19 1305 12/06/19 0751 12/06/19 0751 12/07/19 0240 12/07/19 0240 12/08/19 0601 12/08/19 0821 12/08/19 0822 12/08/19 1100 12/10/19 0627 12/11/19 0755  NA   < >  --  138   < > 139   < > 139 138 138  --  138 134*  K   < >  --  3.2*   < > 3.9   < > 4.3 4.3 4.3  --  5.4* 4.2  CL   < >  --  98  --  101  --  104  --   --   --  102 96*  CO2   < >  --  25  --  23  --  22  --   --   --  21* 25  GLUCOSE   < >  --  140*  --  93  --  87  --   --   --  93 139*  BUN   < >  --  32*  --  52*  --  35*  --   --   --  66* 33*  CREATININE   < >  --  5.14*  --  7.00*  --  5.37*  --   --   --  7.83* 5.74*  CALCIUM   < >  --  10.0  --  10.5*  --  10.1  --   --   --  10.5* 9.6  MG  --   --  2.2  --   --   --   --   --   --  2.2   --   --   PHOS  --  5.7*  --   --   --   --   --   --   --   --   --   --    < > = values in this interval not displayed.   Liver Function Tests: Recent Labs  Lab 12/07/19 1336  ALBUMIN 2.6*   No results for input(s): LIPASE, AMYLASE in the last 168 hours. No results for input(s): AMMONIA in the last 168 hours. CBC: Recent Labs  Lab 12/07/19 0240 12/07/19 0240 12/08/19 0601 12/08/19 0601 12/08/19 0821 12/08/19 0822 12/09/19 0456 12/10/19 0627 12/11/19 0755  WBC 8.0  --  8.3  --   --   --  10.3 9.8 9.5  HGB 10.4*   < > 10.7*   < > 10.9* 10.5* 10.9* 10.0* 10.5*  HCT 31.4*   < > 33.2*   < > 32.0* 31.0* 33.7* 30.0* 31.8*  MCV 101.6*  --  102.2*  --   --   --  103.1* 102.4* 101.9*  PLT 264  --  246  --   --   --  280 248 267   < > = values in this interval not displayed.   Cardiac Enzymes: No results for input(s): CKTOTAL, CKMB, CKMBINDEX, TROPONINI in the last 168 hours. BNP: BNP (last 3 results) Recent Labs    12/03/19 1656  BNP >4,500.0*    ProBNP (last 3 results) No results for input(s): PROBNP in the last 8760 hours.  CBG: No results for input(s): GLUCAP in the last 168 hours.     Signed:  Domenic Polite MD.  [facility] 12/11/2019, 2:48 PM

## 2019-12-11 NOTE — Progress Notes (Signed)
ANTICOAGULATION CONSULT NOTE - follow up  Pharmacy Consult : Restart IV heparin 12/09/19 Indication: atrial fibrillation  Allergies  Allergen Reactions  . Diphenhydramine Hcl Palpitations    Patient Measurements: Height: 5\' 3"  (160 cm) Weight: 67.3 kg (148 lb 5.9 oz) IBW/kg (Calculated) : 56.9 Heparin Dosing Weight: 64.6 kg  Vital Signs: Temp: 97.5 F (36.4 C) (09/17 0928) Temp Source: Oral (09/17 0928) BP: 105/78 (09/17 0928) Pulse Rate: 84 (09/17 0928)  Labs: Recent Labs    12/09/19 0456 12/09/19 0456 12/09/19 2153 12/10/19 0627 12/10/19 1438 12/11/19 0755  HGB 10.9*   < >  --  10.0*  --  10.5*  HCT 33.7*  --   --  30.0*  --  31.8*  PLT 280  --   --  248  --  267  LABPROT  --   --   --  13.5  --  14.6  INR  --   --   --  1.1  --  1.2  HEPARINUNFRC <0.10*   < > <0.10*  --  0.62 0.44  CREATININE  --   --   --  7.83*  --  5.74*   < > = values in this interval not displayed.   Assessment:  59 yr old male to begin IV heparin for atrial fibrillation. Hx PAF but no anticoagulation PTA due to hx bleeding episodes from HD cath site while on Warfarin in the past.  Patient now s/p Cath done on 12/08/19 :RCA, LAD 25% stenosed, diag lesion 30%,  plan med tx  Dr. Doylene Canard started Warfarin  9/15 and Dr. Broadus John requested pharmacy to monitor warfarin.  Heparin drip resumed 9/15. Noted that patient has past history of bleeding episodes from HD cath site in past while on Warfarin for which warfarin was stopped at that time.  Dr. Doylene Canard noted that patient was willing to try warfarin at low dose 2mg . On 9/16 I discussed  with Dr. Doylene Canard the possible need for lovenox bridge as outpatient.  Dr. Doylene Canard said 1-2mg  warfarin without bridging is fine with him and he plans to check INR in his  office Monday and Thursday till desired INR is close to 2.0.   Goal INR per Dr. Doylene Canard is 1.8-2.0   9/17: heparin level is therapeutic on heparin 1250 units/hr.  MD has now discontinued the heparin  drip.  INR = 1.2 , goal 1.8-2 per MD Hgb 10.5 stable, pltc wnl stable. No bleeding noted.    Goal of Therapy:   INR 1.8-2.0 per Dr. Doylene Canard due to h/o bleeding in past (HD site) Heparin level 0.3-0.7 units/ml Monitor platelets by anticoagulation protocol: Yes   Plan:  MD discontinued IV heparin this AM Give Warfarin 2 mg today . INR goal is  1.8-2.0 per Dr. Doylene Canard due to h/o bleeding from HD cath site in past For discharge, recommend 2mg  daily,  INR due on Mon 9/20 per Dr. Doylene Canard Daily  INR while inpatient.   coumadin education booklet and video ordered 9/16.   Consider SCDs for VTE prophylaxis  while INR subtherapeutic  Nicole Cella, RPh Clinical Pharmacist [place] Please utilize Amion for appropriate phone number to reach the unit pharmacist ([place])  12/11/2019

## 2019-12-11 NOTE — Consult Note (Signed)
Ref: Mauricia Area, MD   Subjective:  Awake. No chest pain or shortness of breath. Patient and family are aware of possible improvement in heart function with medications, diet and activity.  Objective:  Vital Signs in the last 24 hours: Temp:  [97.5 F (36.4 C)-98.2 F (36.8 C)] 97.5 F (36.4 C) (09/17 0928) Pulse Rate:  [48-96] 84 (09/17 0928) Cardiac Rhythm: Normal sinus rhythm (09/17 0700) Resp:  [18-29] 20 (09/17 0928) BP: (99-127)/(50-80) 105/78 (09/17 0928) SpO2:  [90 %-98 %] 96 % (09/17 0928) Weight:  [64.6 kg-67.3 kg] 67.3 kg (09/16 2202)  Physical Exam: BP Readings from Last 1 Encounters:  12/11/19 105/78     Wt Readings from Last 1 Encounters:  12/10/19 67.3 kg    Weight change:  Body mass index is 26.28 kg/m. HEENT: [name]/AT, Eyes-Blue, PERL, EOMI, Conjunctiva-Pale pink, Sclera-Non-icteric Neck: No JVD, No bruit, Trachea midline. Lungs:  Clear, Bilateral. Cardiac:  Regular rhythm, normal S1 and S2, no S3. II/VI systolic murmur. Abdomen:  Soft, non-tender. BS present. Extremities:  No edema present. No cyanosis. No clubbing. LUE AVF. CNS: AxOx3, Cranial nerves grossly intact, moves all 4 extremities.  Skin: Warm and dry.   Intake/Output from previous day: 09/16 0701 - 09/17 0700 In: 510 [P.O.:240; I.V.:270] Out: 1616 [Urine:100]    Lab Results: BMET    Component Value Date/Time   NA 134 (L) 12/11/2019 0755   NA 138 12/10/2019 0627   NA 138 12/08/2019 0822   NA 147 (H) 09/09/2008 1450   K 4.2 12/11/2019 0755   K 5.4 (H) 12/10/2019 0627   K 4.3 12/08/2019 0822   K 5.0 (H) 09/09/2008 1450   CL 96 (L) 12/11/2019 0755   CL 102 12/10/2019 0627   CL 104 12/08/2019 0601   CL 111 (H) 09/09/2008 1450   CO2 25 12/11/2019 0755   CO2 21 (L) 12/10/2019 0627   CO2 22 12/08/2019 0601   CO2 26 09/09/2008 1450   GLUCOSE 139 (H) 12/11/2019 0755   GLUCOSE 93 12/10/2019 0627   GLUCOSE 87 12/08/2019 0601   GLUCOSE 106 09/09/2008 1450   BUN 33 (H) 12/11/2019  0755   BUN 66 (H) 12/10/2019 0627   BUN 35 (H) 12/08/2019 0601   BUN 51 (H) 09/09/2008 1450   CREATININE 5.74 (H) 12/11/2019 0755   CREATININE 7.83 (H) 12/10/2019 0627   CREATININE 5.37 (H) 12/08/2019 0601   CREATININE 4.7 (H) 09/09/2008 1450   CALCIUM 9.6 12/11/2019 0755   CALCIUM 10.5 (H) 12/10/2019 0627   CALCIUM 10.1 12/08/2019 0601   CALCIUM 10.8 (H) 09/09/2008 1450   GFRNONAA 10 (L) 12/11/2019 0755   GFRNONAA 7 (L) 12/10/2019 0627   GFRNONAA 11 (L) 12/08/2019 0601   GFRAA 12 (L) 12/11/2019 0755   GFRAA 8 (L) 12/10/2019 0627   GFRAA 13 (L) 12/08/2019 0601   CBC    Component Value Date/Time   WBC 9.5 12/11/2019 0755   RBC 3.12 (L) 12/11/2019 0755   HGB 10.5 (L) 12/11/2019 0755   HGB 15.7 12/07/2008 1137   HCT 31.8 (L) 12/11/2019 0755   HCT 46.2 12/07/2008 1137   PLT 267 12/11/2019 0755   PLT 131 (L) 12/07/2008 1137   MCV 101.9 (H) 12/11/2019 0755   MCV 102 (H) 12/07/2008 1137   MCH 33.7 12/11/2019 0755   MCHC 33.0 12/11/2019 0755   RDW 14.1 12/11/2019 0755   RDW 12.0 12/07/2008 1137   LYMPHSABS 1.4 08/12/2019 2317   LYMPHSABS 2.2 12/07/2008 1137   MONOABS 1.0  08/12/2019 2317   EOSABS 0.1 08/12/2019 2317   EOSABS 0.2 12/07/2008 1137   BASOSABS 0.0 08/12/2019 2317   BASOSABS 0.1 12/07/2008 1137   HEPATIC Function Panel No results for input(s): PROT in the last 8760 hours.  Invalid input(s):  ALBUMIN,  AST,  ALT,  ALKPHOS,  BILIDIR,  IBILI HEMOGLOBIN A1C No components found for: HGA1C,  MPG CARDIAC ENZYMES Lab Results  Component Value Date   TROPONINI <0.30 07/05/2012   BNP No results for input(s): PROBNP in the last 8760 hours. TSH No results for input(s): TSH in the last 8760 hours. CHOLESTEROL No results for input(s): CHOL in the last 8760 hours.  Scheduled Meds: . allopurinol  300 mg Oral Daily  . amiodarone  200 mg Oral Daily  . cefdinir  300 mg Oral Q24H  . Chlorhexidine Gluconate Cloth  6 each Topical Q0600  . Chlorhexidine Gluconate Cloth  6  each Topical Q0600  . cinacalcet  180 mg Oral Q T,Th,Sa-HD  . divalproex  250 mg Oral QHS  . feeding supplement (NEPRO CARB STEADY)  237 mL Oral Q24H  . guaiFENesin  600 mg Oral BID  . lamoTRIgine  25 mg Oral BID  . levETIRAcetam  500 mg Oral BID  . metoprolol tartrate  12.5 mg Oral QID  . multivitamin  1 tablet Oral QHS  . sodium chloride flush  3 mL Intravenous Q12H  . warfarin  2 mg Oral q1600  . warfarin   Does not apply Once  . Warfarin - Pharmacist Dosing Inpatient   Does not apply q1600   Continuous Infusions: . sodium chloride 250 mL (12/07/19 2132)   PRN Meds:.sodium chloride, acetaminophen **OR** acetaminophen, melatonin, metoprolol tartrate, ondansetron **OR** ondansetron (ZOFRAN) IV, sodium chloride, sodium chloride flush, sorbitol  Assessment/Plan: Acute systolic left heart failure Multifocal pneumonia Non-ischemic cardiomyopathy Mild CAD Severe MR Paroxysmal atrial fibrillation ESRD Anemia of chronic disease  Continue current medications. Patient to have INR at office on Mondays and Fridays + as needed.   LOS: 7 days   Time spent including chart review, lab review, examination, discussion with patient : 30 min   Dixie Dials  MD  12/11/2019, 10:57 AM

## 2019-12-11 NOTE — Progress Notes (Signed)
DISCHARGE NOTE HOME Peter Sloan to be discharged Home per MD order. Discussed prescriptions and follow up appointments with the patient. Prescriptions given to patient; medication list explained in detail. Patient verbalized understanding.  Skin clean, dry and intact without evidence of skin break down, no evidence of skin tears noted. IV catheter discontinued intact. Site without signs and symptoms of complications. Dressing and pressure applied. Pt denies pain at the site currently. No complaints noted.  Patient free of lines, drains, and wounds.   An After Visit Summary (AVS) was printed and given to the patient. Patient escorted via wheelchair, and discharged home via private auto.  Vira Agar, RN

## 2019-12-12 ENCOUNTER — Telehealth: Payer: Self-pay | Admitting: Nurse Practitioner

## 2019-12-12 DIAGNOSIS — N186 End stage renal disease: Secondary | ICD-10-CM | POA: Diagnosis not present

## 2019-12-12 DIAGNOSIS — Z992 Dependence on renal dialysis: Secondary | ICD-10-CM | POA: Diagnosis not present

## 2019-12-12 DIAGNOSIS — N2581 Secondary hyperparathyroidism of renal origin: Secondary | ICD-10-CM | POA: Diagnosis not present

## 2019-12-12 NOTE — Telephone Encounter (Signed)
Transition of care contact from inpatient facility  Date of Discharge: 12/11/2019 Date of Contact: 12/12/2019 Method of contact: Phone  Attempted to contact patient to discuss transition of care from inpatient admission. Patient did not answer the phone. Message was left on the patient's voicemail with call back number [phone].

## 2019-12-13 ENCOUNTER — Telehealth: Payer: Self-pay | Admitting: Nurse Practitioner

## 2019-12-13 NOTE — Telephone Encounter (Signed)
Transition of care contact from inpatient facility  Date of Discharge: 12/11/2019 Date of Contact:12/13/2019 Method of contact: Phone  Attempted to contact patient to discuss transition of care from inpatient admission. Patient did not answer the phone. Message was left on the patient's voicemail with call back number [phone].

## 2019-12-14 DIAGNOSIS — I10 Essential (primary) hypertension: Secondary | ICD-10-CM | POA: Diagnosis not present

## 2019-12-14 DIAGNOSIS — N186 End stage renal disease: Secondary | ICD-10-CM | POA: Diagnosis not present

## 2019-12-14 DIAGNOSIS — I2510 Atherosclerotic heart disease of native coronary artery without angina pectoris: Secondary | ICD-10-CM | POA: Diagnosis not present

## 2019-12-14 DIAGNOSIS — I5022 Chronic systolic (congestive) heart failure: Secondary | ICD-10-CM | POA: Diagnosis not present

## 2019-12-15 DIAGNOSIS — I4891 Unspecified atrial fibrillation: Secondary | ICD-10-CM | POA: Diagnosis not present

## 2019-12-15 DIAGNOSIS — N2581 Secondary hyperparathyroidism of renal origin: Secondary | ICD-10-CM | POA: Diagnosis not present

## 2019-12-15 DIAGNOSIS — E8770 Fluid overload, unspecified: Secondary | ICD-10-CM | POA: Diagnosis not present

## 2019-12-15 DIAGNOSIS — Z992 Dependence on renal dialysis: Secondary | ICD-10-CM | POA: Diagnosis not present

## 2019-12-15 DIAGNOSIS — J810 Acute pulmonary edema: Secondary | ICD-10-CM | POA: Diagnosis not present

## 2019-12-15 DIAGNOSIS — N186 End stage renal disease: Secondary | ICD-10-CM | POA: Diagnosis not present

## 2019-12-17 DIAGNOSIS — N2581 Secondary hyperparathyroidism of renal origin: Secondary | ICD-10-CM | POA: Diagnosis not present

## 2019-12-17 DIAGNOSIS — Z992 Dependence on renal dialysis: Secondary | ICD-10-CM | POA: Diagnosis not present

## 2019-12-17 DIAGNOSIS — N186 End stage renal disease: Secondary | ICD-10-CM | POA: Diagnosis not present

## 2019-12-18 DIAGNOSIS — I5022 Chronic systolic (congestive) heart failure: Secondary | ICD-10-CM | POA: Diagnosis not present

## 2019-12-18 DIAGNOSIS — I10 Essential (primary) hypertension: Secondary | ICD-10-CM | POA: Diagnosis not present

## 2019-12-18 DIAGNOSIS — I2510 Atherosclerotic heart disease of native coronary artery without angina pectoris: Secondary | ICD-10-CM | POA: Diagnosis not present

## 2019-12-18 DIAGNOSIS — Z7901 Long term (current) use of anticoagulants: Secondary | ICD-10-CM | POA: Diagnosis not present

## 2019-12-19 DIAGNOSIS — N186 End stage renal disease: Secondary | ICD-10-CM | POA: Diagnosis not present

## 2019-12-19 DIAGNOSIS — Z992 Dependence on renal dialysis: Secondary | ICD-10-CM | POA: Diagnosis not present

## 2019-12-19 DIAGNOSIS — N2581 Secondary hyperparathyroidism of renal origin: Secondary | ICD-10-CM | POA: Diagnosis not present

## 2019-12-21 DIAGNOSIS — I10 Essential (primary) hypertension: Secondary | ICD-10-CM | POA: Diagnosis not present

## 2019-12-21 DIAGNOSIS — I5022 Chronic systolic (congestive) heart failure: Secondary | ICD-10-CM | POA: Diagnosis not present

## 2019-12-21 DIAGNOSIS — I2510 Atherosclerotic heart disease of native coronary artery without angina pectoris: Secondary | ICD-10-CM | POA: Diagnosis not present

## 2019-12-21 DIAGNOSIS — Z7901 Long term (current) use of anticoagulants: Secondary | ICD-10-CM | POA: Diagnosis not present

## 2019-12-22 DIAGNOSIS — Z992 Dependence on renal dialysis: Secondary | ICD-10-CM | POA: Diagnosis not present

## 2019-12-22 DIAGNOSIS — N186 End stage renal disease: Secondary | ICD-10-CM | POA: Diagnosis not present

## 2019-12-22 DIAGNOSIS — N2581 Secondary hyperparathyroidism of renal origin: Secondary | ICD-10-CM | POA: Diagnosis not present

## 2019-12-24 DIAGNOSIS — N2581 Secondary hyperparathyroidism of renal origin: Secondary | ICD-10-CM | POA: Diagnosis not present

## 2019-12-24 DIAGNOSIS — Z992 Dependence on renal dialysis: Secondary | ICD-10-CM | POA: Diagnosis not present

## 2019-12-24 DIAGNOSIS — I120 Hypertensive chronic kidney disease with stage 5 chronic kidney disease or end stage renal disease: Secondary | ICD-10-CM | POA: Diagnosis not present

## 2019-12-24 DIAGNOSIS — N186 End stage renal disease: Secondary | ICD-10-CM | POA: Diagnosis not present

## 2019-12-26 DIAGNOSIS — Z992 Dependence on renal dialysis: Secondary | ICD-10-CM | POA: Diagnosis not present

## 2019-12-26 DIAGNOSIS — N186 End stage renal disease: Secondary | ICD-10-CM | POA: Diagnosis not present

## 2019-12-26 DIAGNOSIS — N2581 Secondary hyperparathyroidism of renal origin: Secondary | ICD-10-CM | POA: Diagnosis not present

## 2019-12-29 DIAGNOSIS — N186 End stage renal disease: Secondary | ICD-10-CM | POA: Diagnosis not present

## 2019-12-29 DIAGNOSIS — Z992 Dependence on renal dialysis: Secondary | ICD-10-CM | POA: Diagnosis not present

## 2019-12-29 DIAGNOSIS — N2581 Secondary hyperparathyroidism of renal origin: Secondary | ICD-10-CM | POA: Diagnosis not present

## 2019-12-31 DIAGNOSIS — N2581 Secondary hyperparathyroidism of renal origin: Secondary | ICD-10-CM | POA: Diagnosis not present

## 2019-12-31 DIAGNOSIS — N186 End stage renal disease: Secondary | ICD-10-CM | POA: Diagnosis not present

## 2019-12-31 DIAGNOSIS — Z992 Dependence on renal dialysis: Secondary | ICD-10-CM | POA: Diagnosis not present

## 2020-01-02 DIAGNOSIS — N186 End stage renal disease: Secondary | ICD-10-CM | POA: Diagnosis not present

## 2020-01-02 DIAGNOSIS — Z992 Dependence on renal dialysis: Secondary | ICD-10-CM | POA: Diagnosis not present

## 2020-01-02 DIAGNOSIS — N2581 Secondary hyperparathyroidism of renal origin: Secondary | ICD-10-CM | POA: Diagnosis not present

## 2020-01-05 DIAGNOSIS — Z992 Dependence on renal dialysis: Secondary | ICD-10-CM | POA: Diagnosis not present

## 2020-01-05 DIAGNOSIS — N2581 Secondary hyperparathyroidism of renal origin: Secondary | ICD-10-CM | POA: Diagnosis not present

## 2020-01-05 DIAGNOSIS — N186 End stage renal disease: Secondary | ICD-10-CM | POA: Diagnosis not present

## 2020-01-06 DIAGNOSIS — N186 End stage renal disease: Secondary | ICD-10-CM | POA: Diagnosis not present

## 2020-01-06 DIAGNOSIS — I10 Essential (primary) hypertension: Secondary | ICD-10-CM | POA: Diagnosis not present

## 2020-01-06 DIAGNOSIS — I2510 Atherosclerotic heart disease of native coronary artery without angina pectoris: Secondary | ICD-10-CM | POA: Diagnosis not present

## 2020-01-06 DIAGNOSIS — I5022 Chronic systolic (congestive) heart failure: Secondary | ICD-10-CM | POA: Diagnosis not present

## 2020-01-07 DIAGNOSIS — N2581 Secondary hyperparathyroidism of renal origin: Secondary | ICD-10-CM | POA: Diagnosis not present

## 2020-01-07 DIAGNOSIS — N186 End stage renal disease: Secondary | ICD-10-CM | POA: Diagnosis not present

## 2020-01-07 DIAGNOSIS — Z992 Dependence on renal dialysis: Secondary | ICD-10-CM | POA: Diagnosis not present

## 2020-01-09 DIAGNOSIS — N2581 Secondary hyperparathyroidism of renal origin: Secondary | ICD-10-CM | POA: Diagnosis not present

## 2020-01-09 DIAGNOSIS — N186 End stage renal disease: Secondary | ICD-10-CM | POA: Diagnosis not present

## 2020-01-09 DIAGNOSIS — Z992 Dependence on renal dialysis: Secondary | ICD-10-CM | POA: Diagnosis not present

## 2020-01-12 DIAGNOSIS — Z992 Dependence on renal dialysis: Secondary | ICD-10-CM | POA: Diagnosis not present

## 2020-01-12 DIAGNOSIS — N186 End stage renal disease: Secondary | ICD-10-CM | POA: Diagnosis not present

## 2020-01-12 DIAGNOSIS — N2581 Secondary hyperparathyroidism of renal origin: Secondary | ICD-10-CM | POA: Diagnosis not present

## 2020-01-14 DIAGNOSIS — Z992 Dependence on renal dialysis: Secondary | ICD-10-CM | POA: Diagnosis not present

## 2020-01-14 DIAGNOSIS — N2581 Secondary hyperparathyroidism of renal origin: Secondary | ICD-10-CM | POA: Diagnosis not present

## 2020-01-14 DIAGNOSIS — N186 End stage renal disease: Secondary | ICD-10-CM | POA: Diagnosis not present

## 2020-01-16 DIAGNOSIS — Z992 Dependence on renal dialysis: Secondary | ICD-10-CM | POA: Diagnosis not present

## 2020-01-16 DIAGNOSIS — N2581 Secondary hyperparathyroidism of renal origin: Secondary | ICD-10-CM | POA: Diagnosis not present

## 2020-01-16 DIAGNOSIS — N186 End stage renal disease: Secondary | ICD-10-CM | POA: Diagnosis not present

## 2020-01-19 DIAGNOSIS — N2581 Secondary hyperparathyroidism of renal origin: Secondary | ICD-10-CM | POA: Diagnosis not present

## 2020-01-19 DIAGNOSIS — Z992 Dependence on renal dialysis: Secondary | ICD-10-CM | POA: Diagnosis not present

## 2020-01-19 DIAGNOSIS — N186 End stage renal disease: Secondary | ICD-10-CM | POA: Diagnosis not present

## 2020-01-21 DIAGNOSIS — N2581 Secondary hyperparathyroidism of renal origin: Secondary | ICD-10-CM | POA: Diagnosis not present

## 2020-01-21 DIAGNOSIS — Z992 Dependence on renal dialysis: Secondary | ICD-10-CM | POA: Diagnosis not present

## 2020-01-21 DIAGNOSIS — N186 End stage renal disease: Secondary | ICD-10-CM | POA: Diagnosis not present

## 2020-01-23 DIAGNOSIS — Z992 Dependence on renal dialysis: Secondary | ICD-10-CM | POA: Diagnosis not present

## 2020-01-23 DIAGNOSIS — N2581 Secondary hyperparathyroidism of renal origin: Secondary | ICD-10-CM | POA: Diagnosis not present

## 2020-01-23 DIAGNOSIS — N186 End stage renal disease: Secondary | ICD-10-CM | POA: Diagnosis not present

## 2020-01-24 DIAGNOSIS — I120 Hypertensive chronic kidney disease with stage 5 chronic kidney disease or end stage renal disease: Secondary | ICD-10-CM | POA: Diagnosis not present

## 2020-01-24 DIAGNOSIS — N186 End stage renal disease: Secondary | ICD-10-CM | POA: Diagnosis not present

## 2020-01-24 DIAGNOSIS — Z992 Dependence on renal dialysis: Secondary | ICD-10-CM | POA: Diagnosis not present

## 2020-01-26 DIAGNOSIS — N2581 Secondary hyperparathyroidism of renal origin: Secondary | ICD-10-CM | POA: Diagnosis not present

## 2020-01-26 DIAGNOSIS — N186 End stage renal disease: Secondary | ICD-10-CM | POA: Diagnosis not present

## 2020-01-26 DIAGNOSIS — Z992 Dependence on renal dialysis: Secondary | ICD-10-CM | POA: Diagnosis not present

## 2020-01-28 DIAGNOSIS — Z992 Dependence on renal dialysis: Secondary | ICD-10-CM | POA: Diagnosis not present

## 2020-01-28 DIAGNOSIS — N186 End stage renal disease: Secondary | ICD-10-CM | POA: Diagnosis not present

## 2020-01-28 DIAGNOSIS — N2581 Secondary hyperparathyroidism of renal origin: Secondary | ICD-10-CM | POA: Diagnosis not present

## 2020-01-30 DIAGNOSIS — N2581 Secondary hyperparathyroidism of renal origin: Secondary | ICD-10-CM | POA: Diagnosis not present

## 2020-01-30 DIAGNOSIS — Z992 Dependence on renal dialysis: Secondary | ICD-10-CM | POA: Diagnosis not present

## 2020-01-30 DIAGNOSIS — N186 End stage renal disease: Secondary | ICD-10-CM | POA: Diagnosis not present

## 2020-02-01 DIAGNOSIS — I10 Essential (primary) hypertension: Secondary | ICD-10-CM | POA: Diagnosis not present

## 2020-02-01 DIAGNOSIS — I5022 Chronic systolic (congestive) heart failure: Secondary | ICD-10-CM | POA: Diagnosis not present

## 2020-02-01 DIAGNOSIS — Z7901 Long term (current) use of anticoagulants: Secondary | ICD-10-CM | POA: Diagnosis not present

## 2020-02-01 DIAGNOSIS — I2510 Atherosclerotic heart disease of native coronary artery without angina pectoris: Secondary | ICD-10-CM | POA: Diagnosis not present

## 2020-02-02 DIAGNOSIS — N186 End stage renal disease: Secondary | ICD-10-CM | POA: Diagnosis not present

## 2020-02-02 DIAGNOSIS — N2581 Secondary hyperparathyroidism of renal origin: Secondary | ICD-10-CM | POA: Diagnosis not present

## 2020-02-02 DIAGNOSIS — Z992 Dependence on renal dialysis: Secondary | ICD-10-CM | POA: Diagnosis not present

## 2020-02-04 DIAGNOSIS — Z992 Dependence on renal dialysis: Secondary | ICD-10-CM | POA: Diagnosis not present

## 2020-02-04 DIAGNOSIS — N2581 Secondary hyperparathyroidism of renal origin: Secondary | ICD-10-CM | POA: Diagnosis not present

## 2020-02-04 DIAGNOSIS — N186 End stage renal disease: Secondary | ICD-10-CM | POA: Diagnosis not present

## 2020-02-06 DIAGNOSIS — N186 End stage renal disease: Secondary | ICD-10-CM | POA: Diagnosis not present

## 2020-02-06 DIAGNOSIS — Z992 Dependence on renal dialysis: Secondary | ICD-10-CM | POA: Diagnosis not present

## 2020-02-06 DIAGNOSIS — N2581 Secondary hyperparathyroidism of renal origin: Secondary | ICD-10-CM | POA: Diagnosis not present

## 2020-02-09 DIAGNOSIS — N2581 Secondary hyperparathyroidism of renal origin: Secondary | ICD-10-CM | POA: Diagnosis not present

## 2020-02-09 DIAGNOSIS — N186 End stage renal disease: Secondary | ICD-10-CM | POA: Diagnosis not present

## 2020-02-09 DIAGNOSIS — Z992 Dependence on renal dialysis: Secondary | ICD-10-CM | POA: Diagnosis not present

## 2020-02-11 DIAGNOSIS — Z992 Dependence on renal dialysis: Secondary | ICD-10-CM | POA: Diagnosis not present

## 2020-02-11 DIAGNOSIS — N186 End stage renal disease: Secondary | ICD-10-CM | POA: Diagnosis not present

## 2020-02-11 DIAGNOSIS — N2581 Secondary hyperparathyroidism of renal origin: Secondary | ICD-10-CM | POA: Diagnosis not present

## 2020-02-13 DIAGNOSIS — N186 End stage renal disease: Secondary | ICD-10-CM | POA: Diagnosis not present

## 2020-02-13 DIAGNOSIS — N2581 Secondary hyperparathyroidism of renal origin: Secondary | ICD-10-CM | POA: Diagnosis not present

## 2020-02-13 DIAGNOSIS — Z992 Dependence on renal dialysis: Secondary | ICD-10-CM | POA: Diagnosis not present

## 2020-02-15 DIAGNOSIS — N2581 Secondary hyperparathyroidism of renal origin: Secondary | ICD-10-CM | POA: Diagnosis not present

## 2020-02-15 DIAGNOSIS — N186 End stage renal disease: Secondary | ICD-10-CM | POA: Diagnosis not present

## 2020-02-15 DIAGNOSIS — Z7901 Long term (current) use of anticoagulants: Secondary | ICD-10-CM | POA: Diagnosis not present

## 2020-02-15 DIAGNOSIS — I10 Essential (primary) hypertension: Secondary | ICD-10-CM | POA: Diagnosis not present

## 2020-02-15 DIAGNOSIS — I5022 Chronic systolic (congestive) heart failure: Secondary | ICD-10-CM | POA: Diagnosis not present

## 2020-02-15 DIAGNOSIS — Z992 Dependence on renal dialysis: Secondary | ICD-10-CM | POA: Diagnosis not present

## 2020-02-15 DIAGNOSIS — I2510 Atherosclerotic heart disease of native coronary artery without angina pectoris: Secondary | ICD-10-CM | POA: Diagnosis not present

## 2020-02-17 DIAGNOSIS — N186 End stage renal disease: Secondary | ICD-10-CM | POA: Diagnosis not present

## 2020-02-17 DIAGNOSIS — N2581 Secondary hyperparathyroidism of renal origin: Secondary | ICD-10-CM | POA: Diagnosis not present

## 2020-02-17 DIAGNOSIS — Z992 Dependence on renal dialysis: Secondary | ICD-10-CM | POA: Diagnosis not present

## 2020-02-20 DIAGNOSIS — N186 End stage renal disease: Secondary | ICD-10-CM | POA: Diagnosis not present

## 2020-02-20 DIAGNOSIS — Z992 Dependence on renal dialysis: Secondary | ICD-10-CM | POA: Diagnosis not present

## 2020-02-20 DIAGNOSIS — N2581 Secondary hyperparathyroidism of renal origin: Secondary | ICD-10-CM | POA: Diagnosis not present

## 2020-02-23 DIAGNOSIS — N186 End stage renal disease: Secondary | ICD-10-CM | POA: Diagnosis not present

## 2020-02-23 DIAGNOSIS — I120 Hypertensive chronic kidney disease with stage 5 chronic kidney disease or end stage renal disease: Secondary | ICD-10-CM | POA: Diagnosis not present

## 2020-02-23 DIAGNOSIS — Z992 Dependence on renal dialysis: Secondary | ICD-10-CM | POA: Diagnosis not present

## 2020-02-23 DIAGNOSIS — N2581 Secondary hyperparathyroidism of renal origin: Secondary | ICD-10-CM | POA: Diagnosis not present

## 2020-02-25 DIAGNOSIS — N2581 Secondary hyperparathyroidism of renal origin: Secondary | ICD-10-CM | POA: Diagnosis not present

## 2020-02-25 DIAGNOSIS — Z992 Dependence on renal dialysis: Secondary | ICD-10-CM | POA: Diagnosis not present

## 2020-02-25 DIAGNOSIS — N186 End stage renal disease: Secondary | ICD-10-CM | POA: Diagnosis not present

## 2020-02-27 DIAGNOSIS — N2581 Secondary hyperparathyroidism of renal origin: Secondary | ICD-10-CM | POA: Diagnosis not present

## 2020-02-27 DIAGNOSIS — Z992 Dependence on renal dialysis: Secondary | ICD-10-CM | POA: Diagnosis not present

## 2020-02-27 DIAGNOSIS — N186 End stage renal disease: Secondary | ICD-10-CM | POA: Diagnosis not present

## 2020-03-01 DIAGNOSIS — N186 End stage renal disease: Secondary | ICD-10-CM | POA: Diagnosis not present

## 2020-03-01 DIAGNOSIS — Z992 Dependence on renal dialysis: Secondary | ICD-10-CM | POA: Diagnosis not present

## 2020-03-01 DIAGNOSIS — N2581 Secondary hyperparathyroidism of renal origin: Secondary | ICD-10-CM | POA: Diagnosis not present

## 2020-03-03 DIAGNOSIS — N186 End stage renal disease: Secondary | ICD-10-CM | POA: Diagnosis not present

## 2020-03-03 DIAGNOSIS — N2581 Secondary hyperparathyroidism of renal origin: Secondary | ICD-10-CM | POA: Diagnosis not present

## 2020-03-03 DIAGNOSIS — Z992 Dependence on renal dialysis: Secondary | ICD-10-CM | POA: Diagnosis not present

## 2020-03-05 DIAGNOSIS — Z992 Dependence on renal dialysis: Secondary | ICD-10-CM | POA: Diagnosis not present

## 2020-03-05 DIAGNOSIS — N186 End stage renal disease: Secondary | ICD-10-CM | POA: Diagnosis not present

## 2020-03-05 DIAGNOSIS — N2581 Secondary hyperparathyroidism of renal origin: Secondary | ICD-10-CM | POA: Diagnosis not present

## 2020-03-08 DIAGNOSIS — Z992 Dependence on renal dialysis: Secondary | ICD-10-CM | POA: Diagnosis not present

## 2020-03-08 DIAGNOSIS — N186 End stage renal disease: Secondary | ICD-10-CM | POA: Diagnosis not present

## 2020-03-08 DIAGNOSIS — N2581 Secondary hyperparathyroidism of renal origin: Secondary | ICD-10-CM | POA: Diagnosis not present

## 2020-03-10 DIAGNOSIS — Z992 Dependence on renal dialysis: Secondary | ICD-10-CM | POA: Diagnosis not present

## 2020-03-10 DIAGNOSIS — N186 End stage renal disease: Secondary | ICD-10-CM | POA: Diagnosis not present

## 2020-03-10 DIAGNOSIS — N2581 Secondary hyperparathyroidism of renal origin: Secondary | ICD-10-CM | POA: Diagnosis not present

## 2020-03-12 DIAGNOSIS — Z992 Dependence on renal dialysis: Secondary | ICD-10-CM | POA: Diagnosis not present

## 2020-03-12 DIAGNOSIS — N186 End stage renal disease: Secondary | ICD-10-CM | POA: Diagnosis not present

## 2020-03-12 DIAGNOSIS — N2581 Secondary hyperparathyroidism of renal origin: Secondary | ICD-10-CM | POA: Diagnosis not present

## 2020-03-15 DIAGNOSIS — Z992 Dependence on renal dialysis: Secondary | ICD-10-CM | POA: Diagnosis not present

## 2020-03-15 DIAGNOSIS — N186 End stage renal disease: Secondary | ICD-10-CM | POA: Diagnosis not present

## 2020-03-15 DIAGNOSIS — N2581 Secondary hyperparathyroidism of renal origin: Secondary | ICD-10-CM | POA: Diagnosis not present

## 2020-03-17 DIAGNOSIS — N2581 Secondary hyperparathyroidism of renal origin: Secondary | ICD-10-CM | POA: Diagnosis not present

## 2020-03-17 DIAGNOSIS — Z992 Dependence on renal dialysis: Secondary | ICD-10-CM | POA: Diagnosis not present

## 2020-03-17 DIAGNOSIS — N186 End stage renal disease: Secondary | ICD-10-CM | POA: Diagnosis not present

## 2020-03-20 DIAGNOSIS — N2581 Secondary hyperparathyroidism of renal origin: Secondary | ICD-10-CM | POA: Diagnosis not present

## 2020-03-20 DIAGNOSIS — Z992 Dependence on renal dialysis: Secondary | ICD-10-CM | POA: Diagnosis not present

## 2020-03-20 DIAGNOSIS — N186 End stage renal disease: Secondary | ICD-10-CM | POA: Diagnosis not present

## 2020-03-22 DIAGNOSIS — Z992 Dependence on renal dialysis: Secondary | ICD-10-CM | POA: Diagnosis not present

## 2020-03-22 DIAGNOSIS — N186 End stage renal disease: Secondary | ICD-10-CM | POA: Diagnosis not present

## 2020-03-22 DIAGNOSIS — N2581 Secondary hyperparathyroidism of renal origin: Secondary | ICD-10-CM | POA: Diagnosis not present

## 2020-03-24 DIAGNOSIS — N186 End stage renal disease: Secondary | ICD-10-CM | POA: Diagnosis not present

## 2020-03-24 DIAGNOSIS — Z992 Dependence on renal dialysis: Secondary | ICD-10-CM | POA: Diagnosis not present

## 2020-03-24 DIAGNOSIS — N2581 Secondary hyperparathyroidism of renal origin: Secondary | ICD-10-CM | POA: Diagnosis not present

## 2020-03-25 DIAGNOSIS — N186 End stage renal disease: Secondary | ICD-10-CM | POA: Diagnosis not present

## 2020-03-25 DIAGNOSIS — Z992 Dependence on renal dialysis: Secondary | ICD-10-CM | POA: Diagnosis not present

## 2020-03-25 DIAGNOSIS — I120 Hypertensive chronic kidney disease with stage 5 chronic kidney disease or end stage renal disease: Secondary | ICD-10-CM | POA: Diagnosis not present

## 2020-03-27 DIAGNOSIS — N186 End stage renal disease: Secondary | ICD-10-CM | POA: Diagnosis not present

## 2020-03-27 DIAGNOSIS — Z992 Dependence on renal dialysis: Secondary | ICD-10-CM | POA: Diagnosis not present

## 2020-03-27 DIAGNOSIS — N2581 Secondary hyperparathyroidism of renal origin: Secondary | ICD-10-CM | POA: Diagnosis not present

## 2020-03-29 DIAGNOSIS — N2581 Secondary hyperparathyroidism of renal origin: Secondary | ICD-10-CM | POA: Diagnosis not present

## 2020-03-29 DIAGNOSIS — N186 End stage renal disease: Secondary | ICD-10-CM | POA: Diagnosis not present

## 2020-03-29 DIAGNOSIS — Z992 Dependence on renal dialysis: Secondary | ICD-10-CM | POA: Diagnosis not present

## 2020-03-30 DIAGNOSIS — N186 End stage renal disease: Secondary | ICD-10-CM | POA: Diagnosis not present

## 2020-03-30 DIAGNOSIS — T82858A Stenosis of vascular prosthetic devices, implants and grafts, initial encounter: Secondary | ICD-10-CM | POA: Diagnosis not present

## 2020-03-30 DIAGNOSIS — I871 Compression of vein: Secondary | ICD-10-CM | POA: Diagnosis not present

## 2020-03-30 DIAGNOSIS — Z992 Dependence on renal dialysis: Secondary | ICD-10-CM | POA: Diagnosis not present

## 2020-03-31 DIAGNOSIS — Z992 Dependence on renal dialysis: Secondary | ICD-10-CM | POA: Diagnosis not present

## 2020-03-31 DIAGNOSIS — N2581 Secondary hyperparathyroidism of renal origin: Secondary | ICD-10-CM | POA: Diagnosis not present

## 2020-03-31 DIAGNOSIS — N186 End stage renal disease: Secondary | ICD-10-CM | POA: Diagnosis not present

## 2020-04-02 DIAGNOSIS — N2581 Secondary hyperparathyroidism of renal origin: Secondary | ICD-10-CM | POA: Diagnosis not present

## 2020-04-02 DIAGNOSIS — Z992 Dependence on renal dialysis: Secondary | ICD-10-CM | POA: Diagnosis not present

## 2020-04-02 DIAGNOSIS — N186 End stage renal disease: Secondary | ICD-10-CM | POA: Diagnosis not present

## 2020-04-05 DIAGNOSIS — Z992 Dependence on renal dialysis: Secondary | ICD-10-CM | POA: Diagnosis not present

## 2020-04-05 DIAGNOSIS — N186 End stage renal disease: Secondary | ICD-10-CM | POA: Diagnosis not present

## 2020-04-05 DIAGNOSIS — N2581 Secondary hyperparathyroidism of renal origin: Secondary | ICD-10-CM | POA: Diagnosis not present

## 2020-04-07 DIAGNOSIS — Z992 Dependence on renal dialysis: Secondary | ICD-10-CM | POA: Diagnosis not present

## 2020-04-07 DIAGNOSIS — N2581 Secondary hyperparathyroidism of renal origin: Secondary | ICD-10-CM | POA: Diagnosis not present

## 2020-04-07 DIAGNOSIS — N186 End stage renal disease: Secondary | ICD-10-CM | POA: Diagnosis not present

## 2020-04-09 DIAGNOSIS — Z992 Dependence on renal dialysis: Secondary | ICD-10-CM | POA: Diagnosis not present

## 2020-04-09 DIAGNOSIS — N2581 Secondary hyperparathyroidism of renal origin: Secondary | ICD-10-CM | POA: Diagnosis not present

## 2020-04-09 DIAGNOSIS — N186 End stage renal disease: Secondary | ICD-10-CM | POA: Diagnosis not present

## 2020-04-12 DIAGNOSIS — N2581 Secondary hyperparathyroidism of renal origin: Secondary | ICD-10-CM | POA: Diagnosis not present

## 2020-04-12 DIAGNOSIS — N186 End stage renal disease: Secondary | ICD-10-CM | POA: Diagnosis not present

## 2020-04-12 DIAGNOSIS — Z992 Dependence on renal dialysis: Secondary | ICD-10-CM | POA: Diagnosis not present

## 2020-04-14 DIAGNOSIS — N2581 Secondary hyperparathyroidism of renal origin: Secondary | ICD-10-CM | POA: Diagnosis not present

## 2020-04-14 DIAGNOSIS — Z992 Dependence on renal dialysis: Secondary | ICD-10-CM | POA: Diagnosis not present

## 2020-04-14 DIAGNOSIS — N186 End stage renal disease: Secondary | ICD-10-CM | POA: Diagnosis not present

## 2020-04-16 DIAGNOSIS — N2581 Secondary hyperparathyroidism of renal origin: Secondary | ICD-10-CM | POA: Diagnosis not present

## 2020-04-16 DIAGNOSIS — Z992 Dependence on renal dialysis: Secondary | ICD-10-CM | POA: Diagnosis not present

## 2020-04-16 DIAGNOSIS — N186 End stage renal disease: Secondary | ICD-10-CM | POA: Diagnosis not present

## 2020-04-19 DIAGNOSIS — N186 End stage renal disease: Secondary | ICD-10-CM | POA: Diagnosis not present

## 2020-04-19 DIAGNOSIS — Z992 Dependence on renal dialysis: Secondary | ICD-10-CM | POA: Diagnosis not present

## 2020-04-19 DIAGNOSIS — N2581 Secondary hyperparathyroidism of renal origin: Secondary | ICD-10-CM | POA: Diagnosis not present

## 2020-04-21 DIAGNOSIS — N2581 Secondary hyperparathyroidism of renal origin: Secondary | ICD-10-CM | POA: Diagnosis not present

## 2020-04-21 DIAGNOSIS — N186 End stage renal disease: Secondary | ICD-10-CM | POA: Diagnosis not present

## 2020-04-21 DIAGNOSIS — Z992 Dependence on renal dialysis: Secondary | ICD-10-CM | POA: Diagnosis not present

## 2020-04-23 DIAGNOSIS — Z992 Dependence on renal dialysis: Secondary | ICD-10-CM | POA: Diagnosis not present

## 2020-04-23 DIAGNOSIS — N186 End stage renal disease: Secondary | ICD-10-CM | POA: Diagnosis not present

## 2020-04-23 DIAGNOSIS — N2581 Secondary hyperparathyroidism of renal origin: Secondary | ICD-10-CM | POA: Diagnosis not present

## 2020-04-25 DIAGNOSIS — I120 Hypertensive chronic kidney disease with stage 5 chronic kidney disease or end stage renal disease: Secondary | ICD-10-CM | POA: Diagnosis not present

## 2020-04-25 DIAGNOSIS — N186 End stage renal disease: Secondary | ICD-10-CM | POA: Diagnosis not present

## 2020-04-25 DIAGNOSIS — Z992 Dependence on renal dialysis: Secondary | ICD-10-CM | POA: Diagnosis not present

## 2020-04-26 DIAGNOSIS — N186 End stage renal disease: Secondary | ICD-10-CM | POA: Diagnosis not present

## 2020-04-26 DIAGNOSIS — Z992 Dependence on renal dialysis: Secondary | ICD-10-CM | POA: Diagnosis not present

## 2020-04-26 DIAGNOSIS — N2581 Secondary hyperparathyroidism of renal origin: Secondary | ICD-10-CM | POA: Diagnosis not present

## 2020-04-27 DIAGNOSIS — Z7901 Long term (current) use of anticoagulants: Secondary | ICD-10-CM | POA: Diagnosis not present

## 2020-04-27 DIAGNOSIS — I5022 Chronic systolic (congestive) heart failure: Secondary | ICD-10-CM | POA: Diagnosis not present

## 2020-04-27 DIAGNOSIS — N186 End stage renal disease: Secondary | ICD-10-CM | POA: Diagnosis not present

## 2020-04-27 DIAGNOSIS — I480 Paroxysmal atrial fibrillation: Secondary | ICD-10-CM | POA: Diagnosis not present

## 2020-04-28 DIAGNOSIS — Z992 Dependence on renal dialysis: Secondary | ICD-10-CM | POA: Diagnosis not present

## 2020-04-28 DIAGNOSIS — N186 End stage renal disease: Secondary | ICD-10-CM | POA: Diagnosis not present

## 2020-04-28 DIAGNOSIS — N2581 Secondary hyperparathyroidism of renal origin: Secondary | ICD-10-CM | POA: Diagnosis not present

## 2020-04-30 DIAGNOSIS — Z992 Dependence on renal dialysis: Secondary | ICD-10-CM | POA: Diagnosis not present

## 2020-04-30 DIAGNOSIS — N186 End stage renal disease: Secondary | ICD-10-CM | POA: Diagnosis not present

## 2020-04-30 DIAGNOSIS — N2581 Secondary hyperparathyroidism of renal origin: Secondary | ICD-10-CM | POA: Diagnosis not present

## 2020-05-03 DIAGNOSIS — N2581 Secondary hyperparathyroidism of renal origin: Secondary | ICD-10-CM | POA: Diagnosis not present

## 2020-05-03 DIAGNOSIS — Z992 Dependence on renal dialysis: Secondary | ICD-10-CM | POA: Diagnosis not present

## 2020-05-03 DIAGNOSIS — N186 End stage renal disease: Secondary | ICD-10-CM | POA: Diagnosis not present

## 2020-05-05 DIAGNOSIS — Z992 Dependence on renal dialysis: Secondary | ICD-10-CM | POA: Diagnosis not present

## 2020-05-05 DIAGNOSIS — N186 End stage renal disease: Secondary | ICD-10-CM | POA: Diagnosis not present

## 2020-05-05 DIAGNOSIS — N2581 Secondary hyperparathyroidism of renal origin: Secondary | ICD-10-CM | POA: Diagnosis not present

## 2020-05-07 DIAGNOSIS — N2581 Secondary hyperparathyroidism of renal origin: Secondary | ICD-10-CM | POA: Diagnosis not present

## 2020-05-07 DIAGNOSIS — N186 End stage renal disease: Secondary | ICD-10-CM | POA: Diagnosis not present

## 2020-05-07 DIAGNOSIS — Z992 Dependence on renal dialysis: Secondary | ICD-10-CM | POA: Diagnosis not present

## 2020-05-10 DIAGNOSIS — Z992 Dependence on renal dialysis: Secondary | ICD-10-CM | POA: Diagnosis not present

## 2020-05-10 DIAGNOSIS — N186 End stage renal disease: Secondary | ICD-10-CM | POA: Diagnosis not present

## 2020-05-10 DIAGNOSIS — N2581 Secondary hyperparathyroidism of renal origin: Secondary | ICD-10-CM | POA: Diagnosis not present

## 2020-05-11 DIAGNOSIS — Z7901 Long term (current) use of anticoagulants: Secondary | ICD-10-CM | POA: Diagnosis not present

## 2020-05-11 DIAGNOSIS — I5022 Chronic systolic (congestive) heart failure: Secondary | ICD-10-CM | POA: Diagnosis not present

## 2020-05-11 DIAGNOSIS — I480 Paroxysmal atrial fibrillation: Secondary | ICD-10-CM | POA: Diagnosis not present

## 2020-05-11 DIAGNOSIS — N186 End stage renal disease: Secondary | ICD-10-CM | POA: Diagnosis not present

## 2020-05-12 DIAGNOSIS — N2581 Secondary hyperparathyroidism of renal origin: Secondary | ICD-10-CM | POA: Diagnosis not present

## 2020-05-12 DIAGNOSIS — Z992 Dependence on renal dialysis: Secondary | ICD-10-CM | POA: Diagnosis not present

## 2020-05-12 DIAGNOSIS — N186 End stage renal disease: Secondary | ICD-10-CM | POA: Diagnosis not present

## 2020-05-14 DIAGNOSIS — N186 End stage renal disease: Secondary | ICD-10-CM | POA: Diagnosis not present

## 2020-05-14 DIAGNOSIS — N2581 Secondary hyperparathyroidism of renal origin: Secondary | ICD-10-CM | POA: Diagnosis not present

## 2020-05-14 DIAGNOSIS — Z992 Dependence on renal dialysis: Secondary | ICD-10-CM | POA: Diagnosis not present

## 2020-05-17 DIAGNOSIS — N186 End stage renal disease: Secondary | ICD-10-CM | POA: Diagnosis not present

## 2020-05-17 DIAGNOSIS — N2581 Secondary hyperparathyroidism of renal origin: Secondary | ICD-10-CM | POA: Diagnosis not present

## 2020-05-17 DIAGNOSIS — Z992 Dependence on renal dialysis: Secondary | ICD-10-CM | POA: Diagnosis not present

## 2020-05-19 DIAGNOSIS — N186 End stage renal disease: Secondary | ICD-10-CM | POA: Diagnosis not present

## 2020-05-19 DIAGNOSIS — Z992 Dependence on renal dialysis: Secondary | ICD-10-CM | POA: Diagnosis not present

## 2020-05-19 DIAGNOSIS — N2581 Secondary hyperparathyroidism of renal origin: Secondary | ICD-10-CM | POA: Diagnosis not present

## 2020-05-21 DIAGNOSIS — N186 End stage renal disease: Secondary | ICD-10-CM | POA: Diagnosis not present

## 2020-05-21 DIAGNOSIS — Z992 Dependence on renal dialysis: Secondary | ICD-10-CM | POA: Diagnosis not present

## 2020-05-21 DIAGNOSIS — N2581 Secondary hyperparathyroidism of renal origin: Secondary | ICD-10-CM | POA: Diagnosis not present

## 2020-05-23 DIAGNOSIS — N186 End stage renal disease: Secondary | ICD-10-CM | POA: Diagnosis not present

## 2020-05-23 DIAGNOSIS — Z992 Dependence on renal dialysis: Secondary | ICD-10-CM | POA: Diagnosis not present

## 2020-05-23 DIAGNOSIS — I120 Hypertensive chronic kidney disease with stage 5 chronic kidney disease or end stage renal disease: Secondary | ICD-10-CM | POA: Diagnosis not present

## 2020-05-24 DIAGNOSIS — N186 End stage renal disease: Secondary | ICD-10-CM | POA: Diagnosis not present

## 2020-05-24 DIAGNOSIS — Z992 Dependence on renal dialysis: Secondary | ICD-10-CM | POA: Diagnosis not present

## 2020-05-24 DIAGNOSIS — N2581 Secondary hyperparathyroidism of renal origin: Secondary | ICD-10-CM | POA: Diagnosis not present

## 2020-05-26 DIAGNOSIS — N2581 Secondary hyperparathyroidism of renal origin: Secondary | ICD-10-CM | POA: Diagnosis not present

## 2020-05-26 DIAGNOSIS — Z992 Dependence on renal dialysis: Secondary | ICD-10-CM | POA: Diagnosis not present

## 2020-05-26 DIAGNOSIS — N186 End stage renal disease: Secondary | ICD-10-CM | POA: Diagnosis not present

## 2020-05-28 DIAGNOSIS — Z992 Dependence on renal dialysis: Secondary | ICD-10-CM | POA: Diagnosis not present

## 2020-05-28 DIAGNOSIS — N2581 Secondary hyperparathyroidism of renal origin: Secondary | ICD-10-CM | POA: Diagnosis not present

## 2020-05-28 DIAGNOSIS — N186 End stage renal disease: Secondary | ICD-10-CM | POA: Diagnosis not present

## 2020-05-31 DIAGNOSIS — N2581 Secondary hyperparathyroidism of renal origin: Secondary | ICD-10-CM | POA: Diagnosis not present

## 2020-05-31 DIAGNOSIS — Z992 Dependence on renal dialysis: Secondary | ICD-10-CM | POA: Diagnosis not present

## 2020-05-31 DIAGNOSIS — N186 End stage renal disease: Secondary | ICD-10-CM | POA: Diagnosis not present

## 2020-06-02 DIAGNOSIS — Z992 Dependence on renal dialysis: Secondary | ICD-10-CM | POA: Diagnosis not present

## 2020-06-02 DIAGNOSIS — N2581 Secondary hyperparathyroidism of renal origin: Secondary | ICD-10-CM | POA: Diagnosis not present

## 2020-06-02 DIAGNOSIS — N186 End stage renal disease: Secondary | ICD-10-CM | POA: Diagnosis not present

## 2020-06-04 DIAGNOSIS — N186 End stage renal disease: Secondary | ICD-10-CM | POA: Diagnosis not present

## 2020-06-04 DIAGNOSIS — N2581 Secondary hyperparathyroidism of renal origin: Secondary | ICD-10-CM | POA: Diagnosis not present

## 2020-06-04 DIAGNOSIS — Z992 Dependence on renal dialysis: Secondary | ICD-10-CM | POA: Diagnosis not present

## 2020-06-07 DIAGNOSIS — N186 End stage renal disease: Secondary | ICD-10-CM | POA: Diagnosis not present

## 2020-06-07 DIAGNOSIS — N2581 Secondary hyperparathyroidism of renal origin: Secondary | ICD-10-CM | POA: Diagnosis not present

## 2020-06-07 DIAGNOSIS — Z992 Dependence on renal dialysis: Secondary | ICD-10-CM | POA: Diagnosis not present

## 2020-06-08 DIAGNOSIS — G40009 Localization-related (focal) (partial) idiopathic epilepsy and epileptic syndromes with seizures of localized onset, not intractable, without status epilepticus: Secondary | ICD-10-CM | POA: Diagnosis not present

## 2020-06-08 DIAGNOSIS — E7849 Other hyperlipidemia: Secondary | ICD-10-CM | POA: Diagnosis not present

## 2020-06-08 DIAGNOSIS — I5022 Chronic systolic (congestive) heart failure: Secondary | ICD-10-CM | POA: Diagnosis not present

## 2020-06-08 DIAGNOSIS — R0602 Shortness of breath: Secondary | ICD-10-CM | POA: Diagnosis not present

## 2020-06-08 DIAGNOSIS — N186 End stage renal disease: Secondary | ICD-10-CM | POA: Diagnosis not present

## 2020-06-08 DIAGNOSIS — I480 Paroxysmal atrial fibrillation: Secondary | ICD-10-CM | POA: Diagnosis not present

## 2020-06-09 DIAGNOSIS — N2581 Secondary hyperparathyroidism of renal origin: Secondary | ICD-10-CM | POA: Diagnosis not present

## 2020-06-09 DIAGNOSIS — Z992 Dependence on renal dialysis: Secondary | ICD-10-CM | POA: Diagnosis not present

## 2020-06-09 DIAGNOSIS — N186 End stage renal disease: Secondary | ICD-10-CM | POA: Diagnosis not present

## 2020-06-11 DIAGNOSIS — N2581 Secondary hyperparathyroidism of renal origin: Secondary | ICD-10-CM | POA: Diagnosis not present

## 2020-06-11 DIAGNOSIS — Z992 Dependence on renal dialysis: Secondary | ICD-10-CM | POA: Diagnosis not present

## 2020-06-11 DIAGNOSIS — N186 End stage renal disease: Secondary | ICD-10-CM | POA: Diagnosis not present

## 2020-06-14 DIAGNOSIS — Z992 Dependence on renal dialysis: Secondary | ICD-10-CM | POA: Diagnosis not present

## 2020-06-14 DIAGNOSIS — N2581 Secondary hyperparathyroidism of renal origin: Secondary | ICD-10-CM | POA: Diagnosis not present

## 2020-06-14 DIAGNOSIS — N186 End stage renal disease: Secondary | ICD-10-CM | POA: Diagnosis not present

## 2020-06-16 DIAGNOSIS — N2581 Secondary hyperparathyroidism of renal origin: Secondary | ICD-10-CM | POA: Diagnosis not present

## 2020-06-16 DIAGNOSIS — Z992 Dependence on renal dialysis: Secondary | ICD-10-CM | POA: Diagnosis not present

## 2020-06-16 DIAGNOSIS — N186 End stage renal disease: Secondary | ICD-10-CM | POA: Diagnosis not present

## 2020-06-18 DIAGNOSIS — N2581 Secondary hyperparathyroidism of renal origin: Secondary | ICD-10-CM | POA: Diagnosis not present

## 2020-06-18 DIAGNOSIS — N186 End stage renal disease: Secondary | ICD-10-CM | POA: Diagnosis not present

## 2020-06-18 DIAGNOSIS — Z992 Dependence on renal dialysis: Secondary | ICD-10-CM | POA: Diagnosis not present

## 2020-06-21 DIAGNOSIS — Z992 Dependence on renal dialysis: Secondary | ICD-10-CM | POA: Diagnosis not present

## 2020-06-21 DIAGNOSIS — N2581 Secondary hyperparathyroidism of renal origin: Secondary | ICD-10-CM | POA: Diagnosis not present

## 2020-06-21 DIAGNOSIS — N186 End stage renal disease: Secondary | ICD-10-CM | POA: Diagnosis not present

## 2020-06-23 DIAGNOSIS — N186 End stage renal disease: Secondary | ICD-10-CM | POA: Diagnosis not present

## 2020-06-23 DIAGNOSIS — N2581 Secondary hyperparathyroidism of renal origin: Secondary | ICD-10-CM | POA: Diagnosis not present

## 2020-06-23 DIAGNOSIS — I120 Hypertensive chronic kidney disease with stage 5 chronic kidney disease or end stage renal disease: Secondary | ICD-10-CM | POA: Diagnosis not present

## 2020-06-23 DIAGNOSIS — Z992 Dependence on renal dialysis: Secondary | ICD-10-CM | POA: Diagnosis not present

## 2020-06-25 DIAGNOSIS — N2581 Secondary hyperparathyroidism of renal origin: Secondary | ICD-10-CM | POA: Diagnosis not present

## 2020-06-25 DIAGNOSIS — N186 End stage renal disease: Secondary | ICD-10-CM | POA: Diagnosis not present

## 2020-06-25 DIAGNOSIS — Z992 Dependence on renal dialysis: Secondary | ICD-10-CM | POA: Diagnosis not present

## 2020-06-28 DIAGNOSIS — N2581 Secondary hyperparathyroidism of renal origin: Secondary | ICD-10-CM | POA: Diagnosis not present

## 2020-06-28 DIAGNOSIS — Z992 Dependence on renal dialysis: Secondary | ICD-10-CM | POA: Diagnosis not present

## 2020-06-28 DIAGNOSIS — N186 End stage renal disease: Secondary | ICD-10-CM | POA: Diagnosis not present

## 2020-06-30 DIAGNOSIS — N186 End stage renal disease: Secondary | ICD-10-CM | POA: Diagnosis not present

## 2020-06-30 DIAGNOSIS — N2581 Secondary hyperparathyroidism of renal origin: Secondary | ICD-10-CM | POA: Diagnosis not present

## 2020-06-30 DIAGNOSIS — Z992 Dependence on renal dialysis: Secondary | ICD-10-CM | POA: Diagnosis not present

## 2020-07-02 DIAGNOSIS — N186 End stage renal disease: Secondary | ICD-10-CM | POA: Diagnosis not present

## 2020-07-02 DIAGNOSIS — Z992 Dependence on renal dialysis: Secondary | ICD-10-CM | POA: Diagnosis not present

## 2020-07-02 DIAGNOSIS — N2581 Secondary hyperparathyroidism of renal origin: Secondary | ICD-10-CM | POA: Diagnosis not present

## 2020-07-05 DIAGNOSIS — N186 End stage renal disease: Secondary | ICD-10-CM | POA: Diagnosis not present

## 2020-07-05 DIAGNOSIS — N2581 Secondary hyperparathyroidism of renal origin: Secondary | ICD-10-CM | POA: Diagnosis not present

## 2020-07-05 DIAGNOSIS — Z992 Dependence on renal dialysis: Secondary | ICD-10-CM | POA: Diagnosis not present

## 2020-07-07 DIAGNOSIS — N2581 Secondary hyperparathyroidism of renal origin: Secondary | ICD-10-CM | POA: Diagnosis not present

## 2020-07-07 DIAGNOSIS — Z992 Dependence on renal dialysis: Secondary | ICD-10-CM | POA: Diagnosis not present

## 2020-07-07 DIAGNOSIS — N186 End stage renal disease: Secondary | ICD-10-CM | POA: Diagnosis not present

## 2020-07-09 DIAGNOSIS — N2581 Secondary hyperparathyroidism of renal origin: Secondary | ICD-10-CM | POA: Diagnosis not present

## 2020-07-09 DIAGNOSIS — Z992 Dependence on renal dialysis: Secondary | ICD-10-CM | POA: Diagnosis not present

## 2020-07-09 DIAGNOSIS — N186 End stage renal disease: Secondary | ICD-10-CM | POA: Diagnosis not present

## 2020-07-12 DIAGNOSIS — N186 End stage renal disease: Secondary | ICD-10-CM | POA: Diagnosis not present

## 2020-07-12 DIAGNOSIS — N2581 Secondary hyperparathyroidism of renal origin: Secondary | ICD-10-CM | POA: Diagnosis not present

## 2020-07-12 DIAGNOSIS — Z992 Dependence on renal dialysis: Secondary | ICD-10-CM | POA: Diagnosis not present

## 2020-07-14 ENCOUNTER — Telehealth: Payer: Self-pay | Admitting: Family Medicine

## 2020-07-14 DIAGNOSIS — N186 End stage renal disease: Secondary | ICD-10-CM | POA: Diagnosis not present

## 2020-07-14 DIAGNOSIS — N2581 Secondary hyperparathyroidism of renal origin: Secondary | ICD-10-CM | POA: Diagnosis not present

## 2020-07-14 DIAGNOSIS — Z992 Dependence on renal dialysis: Secondary | ICD-10-CM | POA: Diagnosis not present

## 2020-07-14 NOTE — Telephone Encounter (Signed)
I am not certain that loud music alone would trigger a seizure. If music is keeping him awake at night and he is not able to sleep or causing increased stress, those things are worrisome. However, I do not feel I can provide a letter stating that he isn't in a safe environment. I would advise that they speak with the building attendant to address concerns. May consider seeking law enforcement assistance if noise ordinances are being violated.

## 2020-07-14 NOTE — Telephone Encounter (Signed)
Pt's wife called stating that the neighbors downstairs blast their music and she is afraid that will trigger seizures in the pt and she is wanting a letter stating that that it is not a safe environment for the pt to live in to give to the office. Please advise.

## 2020-07-16 DIAGNOSIS — N2581 Secondary hyperparathyroidism of renal origin: Secondary | ICD-10-CM | POA: Diagnosis not present

## 2020-07-16 DIAGNOSIS — Z992 Dependence on renal dialysis: Secondary | ICD-10-CM | POA: Diagnosis not present

## 2020-07-16 DIAGNOSIS — N186 End stage renal disease: Secondary | ICD-10-CM | POA: Diagnosis not present

## 2020-07-18 NOTE — Telephone Encounter (Signed)
LMVM for pt or wife to return call, concerning last weeks message.

## 2020-07-18 NOTE — Telephone Encounter (Signed)
I called spoke to family member, son of pt.  They had spoken to neighbors, to apartment International aid/development worker, Event organiser and still a problem.  Stress and sleep deprivation is problem.  He has had no seizures.  This has been going on for a month.  (he states that last time he had seizure he almost died per son), wanted me to ask again.   Sound reducing noise earphones possiblity (cost??)

## 2020-07-18 NOTE — Telephone Encounter (Signed)
Patient's wife, Lattie Haw, left a voicemail this morning returning a call.  She can be reached at [phone]

## 2020-07-19 DIAGNOSIS — N186 End stage renal disease: Secondary | ICD-10-CM | POA: Diagnosis not present

## 2020-07-19 DIAGNOSIS — N2581 Secondary hyperparathyroidism of renal origin: Secondary | ICD-10-CM | POA: Diagnosis not present

## 2020-07-19 DIAGNOSIS — Z992 Dependence on renal dialysis: Secondary | ICD-10-CM | POA: Diagnosis not present

## 2020-07-20 NOTE — Telephone Encounter (Signed)
I called and no change in previous answer concerning letter for pt.  Try noise reducing headphones or ear plugs.  Son of pt appreciated call back.

## 2020-07-20 NOTE — Telephone Encounter (Signed)
Earphones/ear plugs may be helpful.

## 2020-07-21 DIAGNOSIS — N2581 Secondary hyperparathyroidism of renal origin: Secondary | ICD-10-CM | POA: Diagnosis not present

## 2020-07-21 DIAGNOSIS — N186 End stage renal disease: Secondary | ICD-10-CM | POA: Diagnosis not present

## 2020-07-21 DIAGNOSIS — Z992 Dependence on renal dialysis: Secondary | ICD-10-CM | POA: Diagnosis not present

## 2020-07-23 DIAGNOSIS — N2581 Secondary hyperparathyroidism of renal origin: Secondary | ICD-10-CM | POA: Diagnosis not present

## 2020-07-23 DIAGNOSIS — Z992 Dependence on renal dialysis: Secondary | ICD-10-CM | POA: Diagnosis not present

## 2020-07-23 DIAGNOSIS — N186 End stage renal disease: Secondary | ICD-10-CM | POA: Diagnosis not present

## 2020-07-23 DIAGNOSIS — I120 Hypertensive chronic kidney disease with stage 5 chronic kidney disease or end stage renal disease: Secondary | ICD-10-CM | POA: Diagnosis not present

## 2020-07-23 IMAGING — CR DG KNEE COMPLETE 4+V*L*
4 series · 4 of 4 positions shown · non-contrast
Comparison: None

CLINICAL DATA: Pain, LEFT knee pain radiating down lateral surface
of LEFT leg

EXAM:
LEFT KNEE - COMPLETE 4+ VIEW

[knee ap]
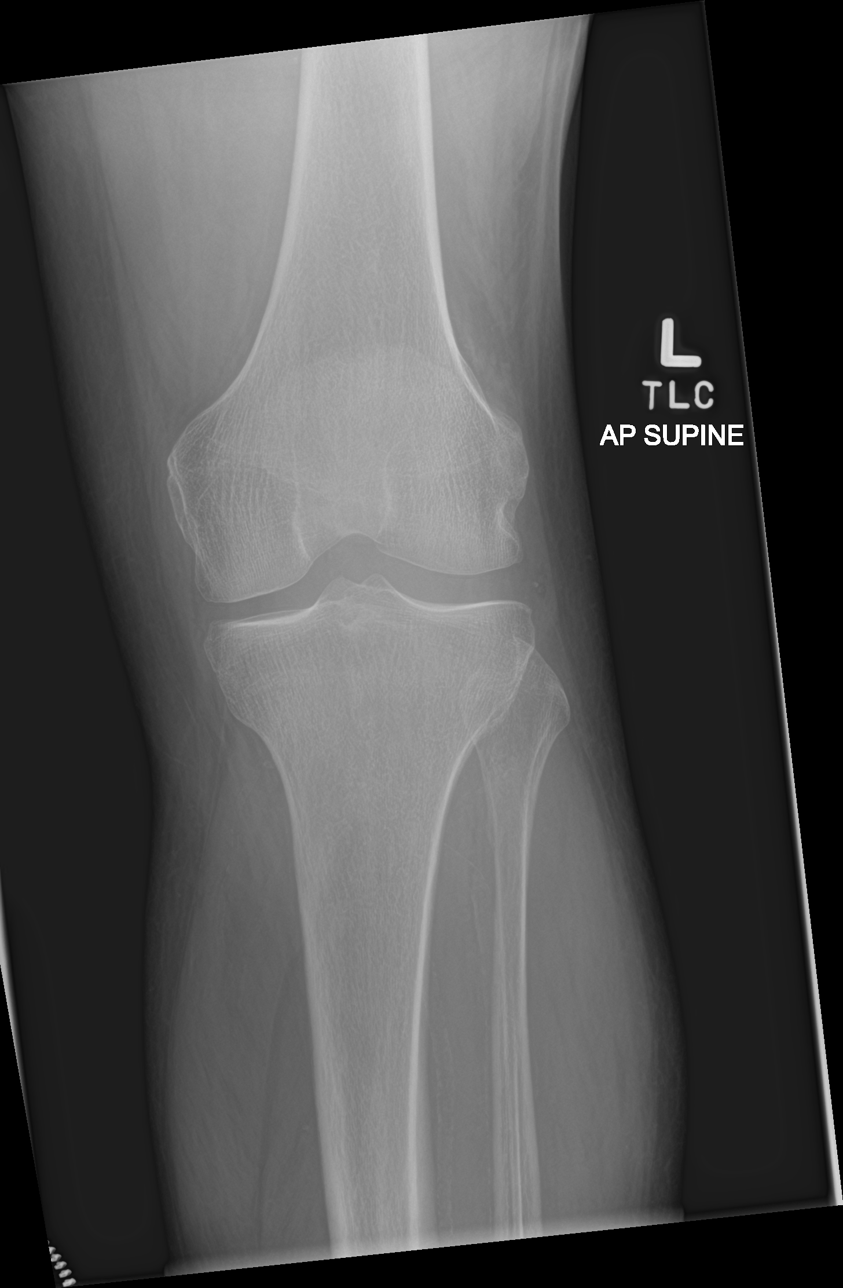

[knee lat]
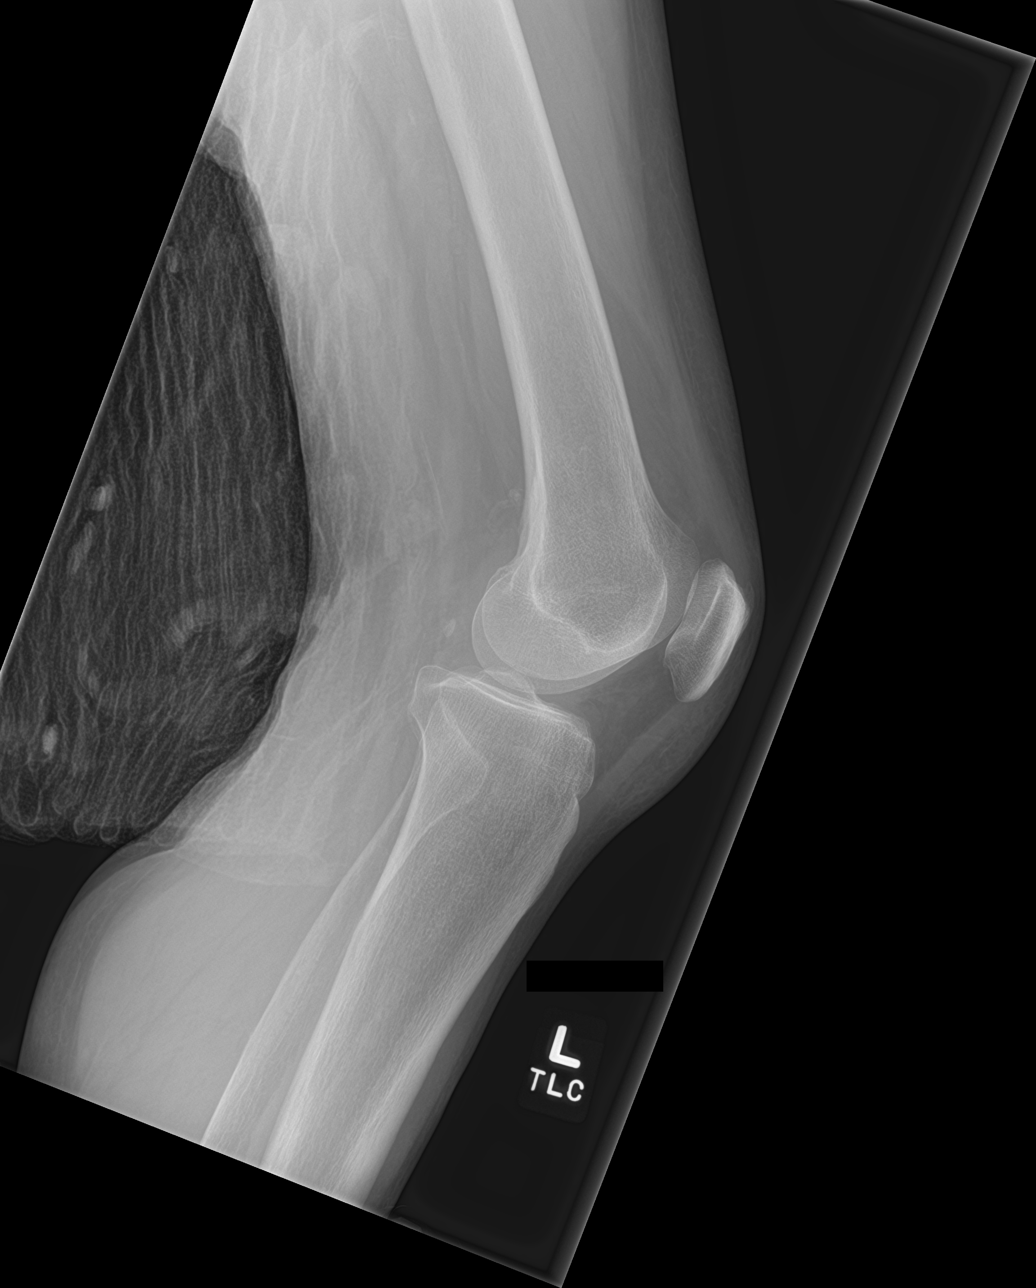

[knee obl (1 of 2)]
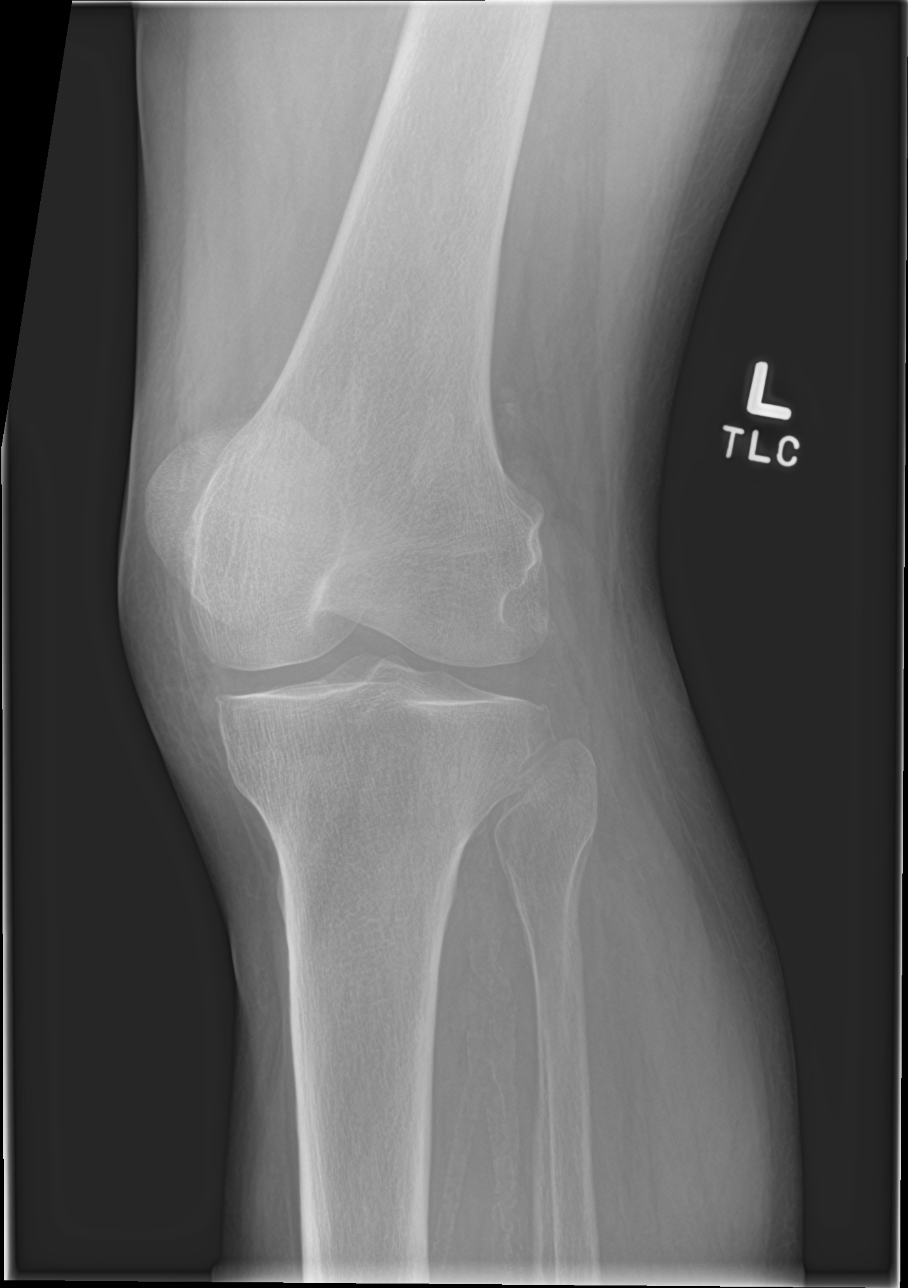

[knee obl (2 of 2)]
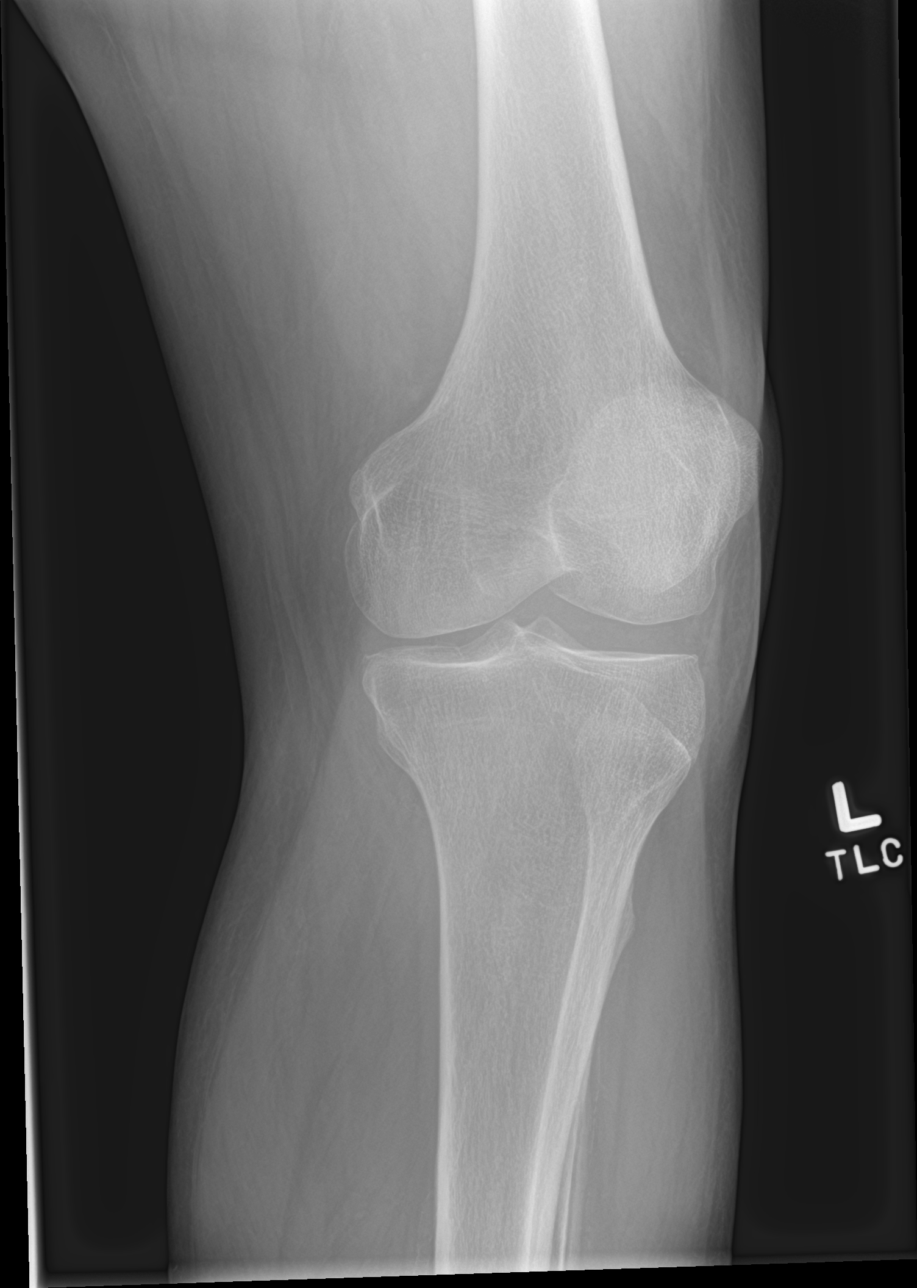

[4 of 4 positions shown; findings below may reference images not displayed]

FINDINGS: No evidence of fracture, dislocation, or joint effusion. No evidence
of arthropathy or other focal bone abnormality. Soft tissues are
unremarkable.
IMPRESSION: Negative evaluation of the LEFT knee.

## 2020-07-26 DIAGNOSIS — N186 End stage renal disease: Secondary | ICD-10-CM | POA: Diagnosis not present

## 2020-07-26 DIAGNOSIS — N2581 Secondary hyperparathyroidism of renal origin: Secondary | ICD-10-CM | POA: Diagnosis not present

## 2020-07-26 DIAGNOSIS — Z992 Dependence on renal dialysis: Secondary | ICD-10-CM | POA: Diagnosis not present

## 2020-07-28 DIAGNOSIS — N2581 Secondary hyperparathyroidism of renal origin: Secondary | ICD-10-CM | POA: Diagnosis not present

## 2020-07-28 DIAGNOSIS — Z992 Dependence on renal dialysis: Secondary | ICD-10-CM | POA: Diagnosis not present

## 2020-07-28 DIAGNOSIS — N186 End stage renal disease: Secondary | ICD-10-CM | POA: Diagnosis not present

## 2020-07-30 DIAGNOSIS — N2581 Secondary hyperparathyroidism of renal origin: Secondary | ICD-10-CM | POA: Diagnosis not present

## 2020-07-30 DIAGNOSIS — Z992 Dependence on renal dialysis: Secondary | ICD-10-CM | POA: Diagnosis not present

## 2020-07-30 DIAGNOSIS — N186 End stage renal disease: Secondary | ICD-10-CM | POA: Diagnosis not present

## 2020-08-02 DIAGNOSIS — N2581 Secondary hyperparathyroidism of renal origin: Secondary | ICD-10-CM | POA: Diagnosis not present

## 2020-08-02 DIAGNOSIS — N186 End stage renal disease: Secondary | ICD-10-CM | POA: Diagnosis not present

## 2020-08-02 DIAGNOSIS — Z992 Dependence on renal dialysis: Secondary | ICD-10-CM | POA: Diagnosis not present

## 2020-08-04 DIAGNOSIS — N2581 Secondary hyperparathyroidism of renal origin: Secondary | ICD-10-CM | POA: Diagnosis not present

## 2020-08-04 DIAGNOSIS — Z992 Dependence on renal dialysis: Secondary | ICD-10-CM | POA: Diagnosis not present

## 2020-08-04 DIAGNOSIS — N186 End stage renal disease: Secondary | ICD-10-CM | POA: Diagnosis not present

## 2020-08-06 DIAGNOSIS — Z992 Dependence on renal dialysis: Secondary | ICD-10-CM | POA: Diagnosis not present

## 2020-08-06 DIAGNOSIS — N186 End stage renal disease: Secondary | ICD-10-CM | POA: Diagnosis not present

## 2020-08-06 DIAGNOSIS — N2581 Secondary hyperparathyroidism of renal origin: Secondary | ICD-10-CM | POA: Diagnosis not present

## 2020-08-09 DIAGNOSIS — N2581 Secondary hyperparathyroidism of renal origin: Secondary | ICD-10-CM | POA: Diagnosis not present

## 2020-08-09 DIAGNOSIS — N186 End stage renal disease: Secondary | ICD-10-CM | POA: Diagnosis not present

## 2020-08-09 DIAGNOSIS — Z992 Dependence on renal dialysis: Secondary | ICD-10-CM | POA: Diagnosis not present

## 2020-08-10 DIAGNOSIS — R0602 Shortness of breath: Secondary | ICD-10-CM | POA: Diagnosis not present

## 2020-08-10 DIAGNOSIS — G40009 Localization-related (focal) (partial) idiopathic epilepsy and epileptic syndromes with seizures of localized onset, not intractable, without status epilepticus: Secondary | ICD-10-CM | POA: Diagnosis not present

## 2020-08-10 DIAGNOSIS — I5022 Chronic systolic (congestive) heart failure: Secondary | ICD-10-CM | POA: Diagnosis not present

## 2020-08-10 DIAGNOSIS — I480 Paroxysmal atrial fibrillation: Secondary | ICD-10-CM | POA: Diagnosis not present

## 2020-08-10 DIAGNOSIS — N186 End stage renal disease: Secondary | ICD-10-CM | POA: Diagnosis not present

## 2020-08-10 DIAGNOSIS — E7849 Other hyperlipidemia: Secondary | ICD-10-CM | POA: Diagnosis not present

## 2020-08-11 DIAGNOSIS — N186 End stage renal disease: Secondary | ICD-10-CM | POA: Diagnosis not present

## 2020-08-11 DIAGNOSIS — Z992 Dependence on renal dialysis: Secondary | ICD-10-CM | POA: Diagnosis not present

## 2020-08-11 DIAGNOSIS — N2581 Secondary hyperparathyroidism of renal origin: Secondary | ICD-10-CM | POA: Diagnosis not present

## 2020-08-13 DIAGNOSIS — Z992 Dependence on renal dialysis: Secondary | ICD-10-CM | POA: Diagnosis not present

## 2020-08-13 DIAGNOSIS — N186 End stage renal disease: Secondary | ICD-10-CM | POA: Diagnosis not present

## 2020-08-13 DIAGNOSIS — N2581 Secondary hyperparathyroidism of renal origin: Secondary | ICD-10-CM | POA: Diagnosis not present

## 2020-08-16 DIAGNOSIS — N186 End stage renal disease: Secondary | ICD-10-CM | POA: Diagnosis not present

## 2020-08-16 DIAGNOSIS — Z992 Dependence on renal dialysis: Secondary | ICD-10-CM | POA: Diagnosis not present

## 2020-08-16 DIAGNOSIS — N2581 Secondary hyperparathyroidism of renal origin: Secondary | ICD-10-CM | POA: Diagnosis not present

## 2020-08-18 DIAGNOSIS — N186 End stage renal disease: Secondary | ICD-10-CM | POA: Diagnosis not present

## 2020-08-18 DIAGNOSIS — Z992 Dependence on renal dialysis: Secondary | ICD-10-CM | POA: Diagnosis not present

## 2020-08-18 DIAGNOSIS — N2581 Secondary hyperparathyroidism of renal origin: Secondary | ICD-10-CM | POA: Diagnosis not present

## 2020-08-20 DIAGNOSIS — N2581 Secondary hyperparathyroidism of renal origin: Secondary | ICD-10-CM | POA: Diagnosis not present

## 2020-08-20 DIAGNOSIS — Z992 Dependence on renal dialysis: Secondary | ICD-10-CM | POA: Diagnosis not present

## 2020-08-20 DIAGNOSIS — N186 End stage renal disease: Secondary | ICD-10-CM | POA: Diagnosis not present

## 2020-08-23 DIAGNOSIS — N186 End stage renal disease: Secondary | ICD-10-CM | POA: Diagnosis not present

## 2020-08-23 DIAGNOSIS — Z992 Dependence on renal dialysis: Secondary | ICD-10-CM | POA: Diagnosis not present

## 2020-08-23 DIAGNOSIS — N2581 Secondary hyperparathyroidism of renal origin: Secondary | ICD-10-CM | POA: Diagnosis not present

## 2020-08-23 DIAGNOSIS — I120 Hypertensive chronic kidney disease with stage 5 chronic kidney disease or end stage renal disease: Secondary | ICD-10-CM | POA: Diagnosis not present

## 2020-08-25 DIAGNOSIS — N186 End stage renal disease: Secondary | ICD-10-CM | POA: Diagnosis not present

## 2020-08-25 DIAGNOSIS — N2581 Secondary hyperparathyroidism of renal origin: Secondary | ICD-10-CM | POA: Diagnosis not present

## 2020-08-25 DIAGNOSIS — Z992 Dependence on renal dialysis: Secondary | ICD-10-CM | POA: Diagnosis not present

## 2020-08-27 DIAGNOSIS — N186 End stage renal disease: Secondary | ICD-10-CM | POA: Diagnosis not present

## 2020-08-27 DIAGNOSIS — N2581 Secondary hyperparathyroidism of renal origin: Secondary | ICD-10-CM | POA: Diagnosis not present

## 2020-08-27 DIAGNOSIS — Z992 Dependence on renal dialysis: Secondary | ICD-10-CM | POA: Diagnosis not present

## 2020-08-30 DIAGNOSIS — Z992 Dependence on renal dialysis: Secondary | ICD-10-CM | POA: Diagnosis not present

## 2020-08-30 DIAGNOSIS — N186 End stage renal disease: Secondary | ICD-10-CM | POA: Diagnosis not present

## 2020-08-30 DIAGNOSIS — N2581 Secondary hyperparathyroidism of renal origin: Secondary | ICD-10-CM | POA: Diagnosis not present

## 2020-09-01 DIAGNOSIS — Z992 Dependence on renal dialysis: Secondary | ICD-10-CM | POA: Diagnosis not present

## 2020-09-01 DIAGNOSIS — N186 End stage renal disease: Secondary | ICD-10-CM | POA: Diagnosis not present

## 2020-09-01 DIAGNOSIS — N2581 Secondary hyperparathyroidism of renal origin: Secondary | ICD-10-CM | POA: Diagnosis not present

## 2020-09-03 DIAGNOSIS — N186 End stage renal disease: Secondary | ICD-10-CM | POA: Diagnosis not present

## 2020-09-03 DIAGNOSIS — N2581 Secondary hyperparathyroidism of renal origin: Secondary | ICD-10-CM | POA: Diagnosis not present

## 2020-09-03 DIAGNOSIS — Z992 Dependence on renal dialysis: Secondary | ICD-10-CM | POA: Diagnosis not present

## 2020-09-06 DIAGNOSIS — N2581 Secondary hyperparathyroidism of renal origin: Secondary | ICD-10-CM | POA: Diagnosis not present

## 2020-09-06 DIAGNOSIS — Z992 Dependence on renal dialysis: Secondary | ICD-10-CM | POA: Diagnosis not present

## 2020-09-06 DIAGNOSIS — N186 End stage renal disease: Secondary | ICD-10-CM | POA: Diagnosis not present

## 2020-09-08 DIAGNOSIS — N186 End stage renal disease: Secondary | ICD-10-CM | POA: Diagnosis not present

## 2020-09-08 DIAGNOSIS — Z992 Dependence on renal dialysis: Secondary | ICD-10-CM | POA: Diagnosis not present

## 2020-09-08 DIAGNOSIS — N2581 Secondary hyperparathyroidism of renal origin: Secondary | ICD-10-CM | POA: Diagnosis not present

## 2020-09-10 DIAGNOSIS — N186 End stage renal disease: Secondary | ICD-10-CM | POA: Diagnosis not present

## 2020-09-10 DIAGNOSIS — Z992 Dependence on renal dialysis: Secondary | ICD-10-CM | POA: Diagnosis not present

## 2020-09-10 DIAGNOSIS — N2581 Secondary hyperparathyroidism of renal origin: Secondary | ICD-10-CM | POA: Diagnosis not present

## 2020-09-13 DIAGNOSIS — N186 End stage renal disease: Secondary | ICD-10-CM | POA: Diagnosis not present

## 2020-09-13 DIAGNOSIS — N2581 Secondary hyperparathyroidism of renal origin: Secondary | ICD-10-CM | POA: Diagnosis not present

## 2020-09-13 DIAGNOSIS — Z992 Dependence on renal dialysis: Secondary | ICD-10-CM | POA: Diagnosis not present

## 2020-09-15 DIAGNOSIS — N186 End stage renal disease: Secondary | ICD-10-CM | POA: Diagnosis not present

## 2020-09-15 DIAGNOSIS — N2581 Secondary hyperparathyroidism of renal origin: Secondary | ICD-10-CM | POA: Diagnosis not present

## 2020-09-15 DIAGNOSIS — Z992 Dependence on renal dialysis: Secondary | ICD-10-CM | POA: Diagnosis not present

## 2020-09-17 DIAGNOSIS — N2581 Secondary hyperparathyroidism of renal origin: Secondary | ICD-10-CM | POA: Diagnosis not present

## 2020-09-17 DIAGNOSIS — N186 End stage renal disease: Secondary | ICD-10-CM | POA: Diagnosis not present

## 2020-09-17 DIAGNOSIS — Z992 Dependence on renal dialysis: Secondary | ICD-10-CM | POA: Diagnosis not present

## 2020-09-20 DIAGNOSIS — N186 End stage renal disease: Secondary | ICD-10-CM | POA: Diagnosis not present

## 2020-09-20 DIAGNOSIS — Z992 Dependence on renal dialysis: Secondary | ICD-10-CM | POA: Diagnosis not present

## 2020-09-20 DIAGNOSIS — N2581 Secondary hyperparathyroidism of renal origin: Secondary | ICD-10-CM | POA: Diagnosis not present

## 2020-09-22 DIAGNOSIS — Z992 Dependence on renal dialysis: Secondary | ICD-10-CM | POA: Diagnosis not present

## 2020-09-22 DIAGNOSIS — I120 Hypertensive chronic kidney disease with stage 5 chronic kidney disease or end stage renal disease: Secondary | ICD-10-CM | POA: Diagnosis not present

## 2020-09-22 DIAGNOSIS — N2581 Secondary hyperparathyroidism of renal origin: Secondary | ICD-10-CM | POA: Diagnosis not present

## 2020-09-22 DIAGNOSIS — N186 End stage renal disease: Secondary | ICD-10-CM | POA: Diagnosis not present

## 2020-09-24 DIAGNOSIS — Z992 Dependence on renal dialysis: Secondary | ICD-10-CM | POA: Diagnosis not present

## 2020-09-24 DIAGNOSIS — N2581 Secondary hyperparathyroidism of renal origin: Secondary | ICD-10-CM | POA: Diagnosis not present

## 2020-09-24 DIAGNOSIS — N186 End stage renal disease: Secondary | ICD-10-CM | POA: Diagnosis not present

## 2020-09-27 DIAGNOSIS — Z992 Dependence on renal dialysis: Secondary | ICD-10-CM | POA: Diagnosis not present

## 2020-09-27 DIAGNOSIS — N2581 Secondary hyperparathyroidism of renal origin: Secondary | ICD-10-CM | POA: Diagnosis not present

## 2020-09-27 DIAGNOSIS — N186 End stage renal disease: Secondary | ICD-10-CM | POA: Diagnosis not present

## 2020-09-28 ENCOUNTER — Ambulatory Visit: Payer: Medicare HMO | Admitting: Family Medicine

## 2020-09-29 DIAGNOSIS — N186 End stage renal disease: Secondary | ICD-10-CM | POA: Diagnosis not present

## 2020-09-29 DIAGNOSIS — N2581 Secondary hyperparathyroidism of renal origin: Secondary | ICD-10-CM | POA: Diagnosis not present

## 2020-09-29 DIAGNOSIS — Z992 Dependence on renal dialysis: Secondary | ICD-10-CM | POA: Diagnosis not present

## 2020-10-01 DIAGNOSIS — Z992 Dependence on renal dialysis: Secondary | ICD-10-CM | POA: Diagnosis not present

## 2020-10-01 DIAGNOSIS — N186 End stage renal disease: Secondary | ICD-10-CM | POA: Diagnosis not present

## 2020-10-01 DIAGNOSIS — N2581 Secondary hyperparathyroidism of renal origin: Secondary | ICD-10-CM | POA: Diagnosis not present

## 2020-10-04 DIAGNOSIS — Z992 Dependence on renal dialysis: Secondary | ICD-10-CM | POA: Diagnosis not present

## 2020-10-04 DIAGNOSIS — N2581 Secondary hyperparathyroidism of renal origin: Secondary | ICD-10-CM | POA: Diagnosis not present

## 2020-10-04 DIAGNOSIS — N186 End stage renal disease: Secondary | ICD-10-CM | POA: Diagnosis not present

## 2020-10-06 DIAGNOSIS — N186 End stage renal disease: Secondary | ICD-10-CM | POA: Diagnosis not present

## 2020-10-06 DIAGNOSIS — Z992 Dependence on renal dialysis: Secondary | ICD-10-CM | POA: Diagnosis not present

## 2020-10-06 DIAGNOSIS — N2581 Secondary hyperparathyroidism of renal origin: Secondary | ICD-10-CM | POA: Diagnosis not present

## 2020-10-08 DIAGNOSIS — N186 End stage renal disease: Secondary | ICD-10-CM | POA: Diagnosis not present

## 2020-10-08 DIAGNOSIS — Z992 Dependence on renal dialysis: Secondary | ICD-10-CM | POA: Diagnosis not present

## 2020-10-08 DIAGNOSIS — N2581 Secondary hyperparathyroidism of renal origin: Secondary | ICD-10-CM | POA: Diagnosis not present

## 2020-10-10 DIAGNOSIS — E7849 Other hyperlipidemia: Secondary | ICD-10-CM | POA: Diagnosis not present

## 2020-10-10 DIAGNOSIS — N429 Disorder of prostate, unspecified: Secondary | ICD-10-CM | POA: Diagnosis not present

## 2020-10-10 DIAGNOSIS — I480 Paroxysmal atrial fibrillation: Secondary | ICD-10-CM | POA: Diagnosis not present

## 2020-10-10 DIAGNOSIS — E876 Hypokalemia: Secondary | ICD-10-CM | POA: Diagnosis not present

## 2020-10-10 DIAGNOSIS — D649 Anemia, unspecified: Secondary | ICD-10-CM | POA: Diagnosis not present

## 2020-10-10 DIAGNOSIS — Z79899 Other long term (current) drug therapy: Secondary | ICD-10-CM | POA: Diagnosis not present

## 2020-10-11 DIAGNOSIS — N2581 Secondary hyperparathyroidism of renal origin: Secondary | ICD-10-CM | POA: Diagnosis not present

## 2020-10-11 DIAGNOSIS — Z992 Dependence on renal dialysis: Secondary | ICD-10-CM | POA: Diagnosis not present

## 2020-10-11 DIAGNOSIS — N186 End stage renal disease: Secondary | ICD-10-CM | POA: Diagnosis not present

## 2020-10-12 DIAGNOSIS — R0602 Shortness of breath: Secondary | ICD-10-CM | POA: Diagnosis not present

## 2020-10-12 DIAGNOSIS — I480 Paroxysmal atrial fibrillation: Secondary | ICD-10-CM | POA: Diagnosis not present

## 2020-10-12 DIAGNOSIS — I5022 Chronic systolic (congestive) heart failure: Secondary | ICD-10-CM | POA: Diagnosis not present

## 2020-10-12 DIAGNOSIS — E7849 Other hyperlipidemia: Secondary | ICD-10-CM | POA: Diagnosis not present

## 2020-10-12 DIAGNOSIS — N186 End stage renal disease: Secondary | ICD-10-CM | POA: Diagnosis not present

## 2020-10-12 DIAGNOSIS — G40009 Localization-related (focal) (partial) idiopathic epilepsy and epileptic syndromes with seizures of localized onset, not intractable, without status epilepticus: Secondary | ICD-10-CM | POA: Diagnosis not present

## 2020-10-13 DIAGNOSIS — N186 End stage renal disease: Secondary | ICD-10-CM | POA: Diagnosis not present

## 2020-10-13 DIAGNOSIS — Z992 Dependence on renal dialysis: Secondary | ICD-10-CM | POA: Diagnosis not present

## 2020-10-13 DIAGNOSIS — N2581 Secondary hyperparathyroidism of renal origin: Secondary | ICD-10-CM | POA: Diagnosis not present

## 2020-10-15 DIAGNOSIS — Z992 Dependence on renal dialysis: Secondary | ICD-10-CM | POA: Diagnosis not present

## 2020-10-15 DIAGNOSIS — N186 End stage renal disease: Secondary | ICD-10-CM | POA: Diagnosis not present

## 2020-10-15 DIAGNOSIS — N2581 Secondary hyperparathyroidism of renal origin: Secondary | ICD-10-CM | POA: Diagnosis not present

## 2020-10-18 DIAGNOSIS — N2581 Secondary hyperparathyroidism of renal origin: Secondary | ICD-10-CM | POA: Diagnosis not present

## 2020-10-18 DIAGNOSIS — N186 End stage renal disease: Secondary | ICD-10-CM | POA: Diagnosis not present

## 2020-10-18 DIAGNOSIS — Z992 Dependence on renal dialysis: Secondary | ICD-10-CM | POA: Diagnosis not present

## 2020-10-20 ENCOUNTER — Other Ambulatory Visit: Payer: Self-pay

## 2020-10-20 DIAGNOSIS — N186 End stage renal disease: Secondary | ICD-10-CM | POA: Diagnosis not present

## 2020-10-20 DIAGNOSIS — N2581 Secondary hyperparathyroidism of renal origin: Secondary | ICD-10-CM | POA: Diagnosis not present

## 2020-10-20 DIAGNOSIS — Z992 Dependence on renal dialysis: Secondary | ICD-10-CM | POA: Diagnosis not present

## 2020-10-20 MED ORDER — LAMOTRIGINE 25 MG PO TABS
25.0000 mg | ORAL_TABLET | Freq: Two times a day (BID) | ORAL | 0 refills | Status: DC
Start: 2020-10-20 — End: 2020-12-29

## 2020-10-20 MED ORDER — LEVETIRACETAM 500 MG PO TABS
500.0000 mg | ORAL_TABLET | Freq: Two times a day (BID) | ORAL | 0 refills | Status: DC
Start: 2020-10-20 — End: 2020-12-29

## 2020-10-22 DIAGNOSIS — N2581 Secondary hyperparathyroidism of renal origin: Secondary | ICD-10-CM | POA: Diagnosis not present

## 2020-10-22 DIAGNOSIS — Z992 Dependence on renal dialysis: Secondary | ICD-10-CM | POA: Diagnosis not present

## 2020-10-22 DIAGNOSIS — N186 End stage renal disease: Secondary | ICD-10-CM | POA: Diagnosis not present

## 2020-10-23 DIAGNOSIS — N186 End stage renal disease: Secondary | ICD-10-CM | POA: Diagnosis not present

## 2020-10-23 DIAGNOSIS — Z992 Dependence on renal dialysis: Secondary | ICD-10-CM | POA: Diagnosis not present

## 2020-10-23 DIAGNOSIS — I120 Hypertensive chronic kidney disease with stage 5 chronic kidney disease or end stage renal disease: Secondary | ICD-10-CM | POA: Diagnosis not present

## 2020-10-24 ENCOUNTER — Other Ambulatory Visit: Payer: Self-pay | Admitting: Neurology

## 2020-10-24 ENCOUNTER — Telehealth: Payer: Self-pay | Admitting: Family Medicine

## 2020-10-24 MED ORDER — DIVALPROEX SODIUM ER 250 MG PO TB24
250.0000 mg | ORAL_TABLET | Freq: Every day | ORAL | 0 refills | Status: DC
Start: 2020-10-24 — End: 2020-12-29

## 2020-10-24 NOTE — Telephone Encounter (Signed)
Pt request refill divalproex (DEPAKOTE ER) 250 MG 24 hr tablet at [facility] [id number] [facility] [address]

## 2020-10-24 NOTE — Telephone Encounter (Signed)
Medication refill has been sent to the pharmacy for the patient for 3 mth supply to last until follow up visit in october

## 2020-10-25 DIAGNOSIS — N2581 Secondary hyperparathyroidism of renal origin: Secondary | ICD-10-CM | POA: Diagnosis not present

## 2020-10-25 DIAGNOSIS — N186 End stage renal disease: Secondary | ICD-10-CM | POA: Diagnosis not present

## 2020-10-25 DIAGNOSIS — Z992 Dependence on renal dialysis: Secondary | ICD-10-CM | POA: Diagnosis not present

## 2020-10-26 DIAGNOSIS — I5022 Chronic systolic (congestive) heart failure: Secondary | ICD-10-CM | POA: Diagnosis not present

## 2020-10-26 DIAGNOSIS — G40009 Localization-related (focal) (partial) idiopathic epilepsy and epileptic syndromes with seizures of localized onset, not intractable, without status epilepticus: Secondary | ICD-10-CM | POA: Diagnosis not present

## 2020-10-26 DIAGNOSIS — I480 Paroxysmal atrial fibrillation: Secondary | ICD-10-CM | POA: Diagnosis not present

## 2020-10-26 DIAGNOSIS — N186 End stage renal disease: Secondary | ICD-10-CM | POA: Diagnosis not present

## 2020-10-26 DIAGNOSIS — R0602 Shortness of breath: Secondary | ICD-10-CM | POA: Diagnosis not present

## 2020-10-26 DIAGNOSIS — E7849 Other hyperlipidemia: Secondary | ICD-10-CM | POA: Diagnosis not present

## 2020-10-27 DIAGNOSIS — N2581 Secondary hyperparathyroidism of renal origin: Secondary | ICD-10-CM | POA: Diagnosis not present

## 2020-10-27 DIAGNOSIS — N186 End stage renal disease: Secondary | ICD-10-CM | POA: Diagnosis not present

## 2020-10-27 DIAGNOSIS — Z992 Dependence on renal dialysis: Secondary | ICD-10-CM | POA: Diagnosis not present

## 2020-10-29 DIAGNOSIS — N2581 Secondary hyperparathyroidism of renal origin: Secondary | ICD-10-CM | POA: Diagnosis not present

## 2020-10-29 DIAGNOSIS — Z992 Dependence on renal dialysis: Secondary | ICD-10-CM | POA: Diagnosis not present

## 2020-10-29 DIAGNOSIS — N186 End stage renal disease: Secondary | ICD-10-CM | POA: Diagnosis not present

## 2020-11-01 DIAGNOSIS — N2581 Secondary hyperparathyroidism of renal origin: Secondary | ICD-10-CM | POA: Diagnosis not present

## 2020-11-01 DIAGNOSIS — Z992 Dependence on renal dialysis: Secondary | ICD-10-CM | POA: Diagnosis not present

## 2020-11-01 DIAGNOSIS — N186 End stage renal disease: Secondary | ICD-10-CM | POA: Diagnosis not present

## 2020-11-03 DIAGNOSIS — N2581 Secondary hyperparathyroidism of renal origin: Secondary | ICD-10-CM | POA: Diagnosis not present

## 2020-11-03 DIAGNOSIS — Z992 Dependence on renal dialysis: Secondary | ICD-10-CM | POA: Diagnosis not present

## 2020-11-03 DIAGNOSIS — N186 End stage renal disease: Secondary | ICD-10-CM | POA: Diagnosis not present

## 2020-11-05 DIAGNOSIS — N2581 Secondary hyperparathyroidism of renal origin: Secondary | ICD-10-CM | POA: Diagnosis not present

## 2020-11-05 DIAGNOSIS — N186 End stage renal disease: Secondary | ICD-10-CM | POA: Diagnosis not present

## 2020-11-05 DIAGNOSIS — Z992 Dependence on renal dialysis: Secondary | ICD-10-CM | POA: Diagnosis not present

## 2020-11-08 DIAGNOSIS — Z992 Dependence on renal dialysis: Secondary | ICD-10-CM | POA: Diagnosis not present

## 2020-11-08 DIAGNOSIS — N186 End stage renal disease: Secondary | ICD-10-CM | POA: Diagnosis not present

## 2020-11-08 DIAGNOSIS — N2581 Secondary hyperparathyroidism of renal origin: Secondary | ICD-10-CM | POA: Diagnosis not present

## 2020-11-10 DIAGNOSIS — N186 End stage renal disease: Secondary | ICD-10-CM | POA: Diagnosis not present

## 2020-11-10 DIAGNOSIS — Z992 Dependence on renal dialysis: Secondary | ICD-10-CM | POA: Diagnosis not present

## 2020-11-10 DIAGNOSIS — N2581 Secondary hyperparathyroidism of renal origin: Secondary | ICD-10-CM | POA: Diagnosis not present

## 2020-11-12 DIAGNOSIS — Z992 Dependence on renal dialysis: Secondary | ICD-10-CM | POA: Diagnosis not present

## 2020-11-12 DIAGNOSIS — N2581 Secondary hyperparathyroidism of renal origin: Secondary | ICD-10-CM | POA: Diagnosis not present

## 2020-11-12 DIAGNOSIS — N186 End stage renal disease: Secondary | ICD-10-CM | POA: Diagnosis not present

## 2020-11-15 DIAGNOSIS — N2581 Secondary hyperparathyroidism of renal origin: Secondary | ICD-10-CM | POA: Diagnosis not present

## 2020-11-15 DIAGNOSIS — Z992 Dependence on renal dialysis: Secondary | ICD-10-CM | POA: Diagnosis not present

## 2020-11-15 DIAGNOSIS — N186 End stage renal disease: Secondary | ICD-10-CM | POA: Diagnosis not present

## 2020-11-15 IMAGING — DX DG CHEST 1V PORT
1 series · 1 of 1 positions shown · non-contrast
Comparison: 01/12/2012

CLINICAL DATA: Short of breath

EXAM:
PORTABLE CHEST 1 VIEW

[chest]
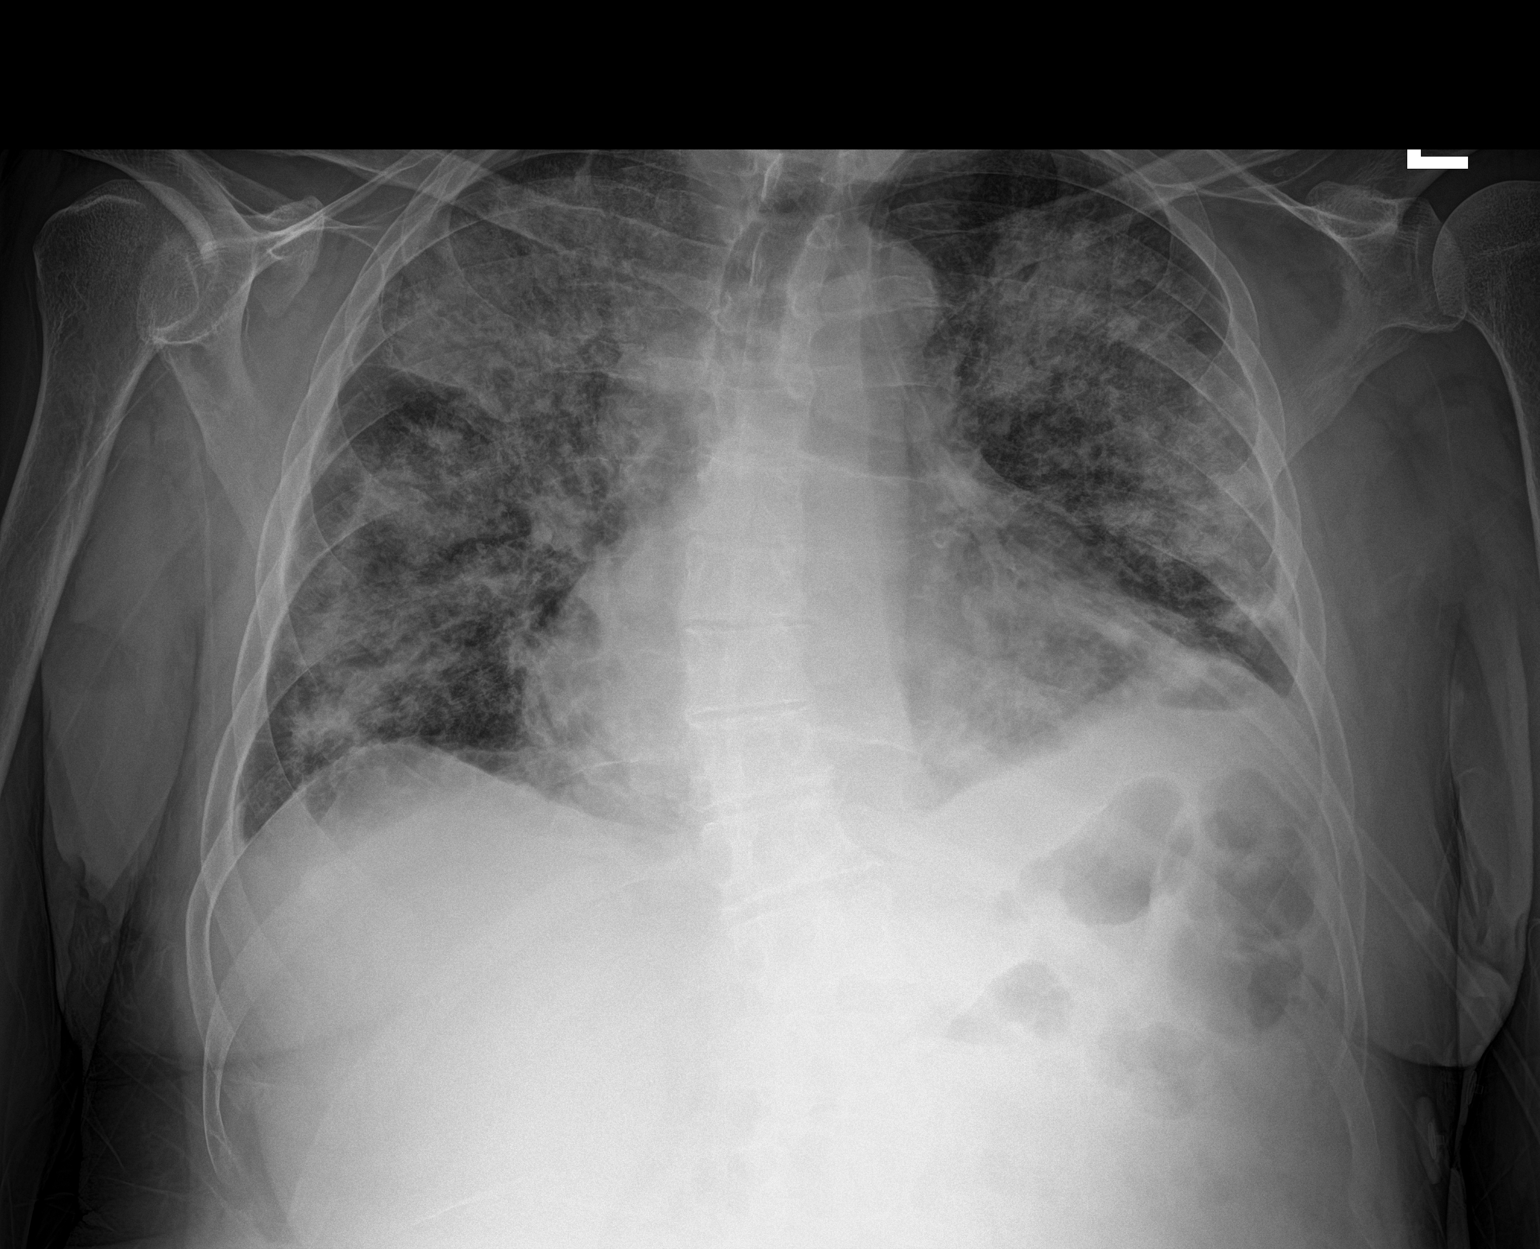

[1 of 1 positions shown; findings below may reference images not displayed]

FINDINGS: Single frontal view of the chest demonstrates an unremarkable
cardiac silhouette. There is widespread bilateral ground-glass
airspace disease. Chronic blunting of the left costophrenic angle
consistent with pleural thickening or scarring. No pneumothorax.
IMPRESSION: 1. Widespread bilateral ground-glass airspace disease consistent
with multifocal pneumonia or edema.

## 2020-11-17 DIAGNOSIS — Z992 Dependence on renal dialysis: Secondary | ICD-10-CM | POA: Diagnosis not present

## 2020-11-17 DIAGNOSIS — N186 End stage renal disease: Secondary | ICD-10-CM | POA: Diagnosis not present

## 2020-11-17 DIAGNOSIS — N2581 Secondary hyperparathyroidism of renal origin: Secondary | ICD-10-CM | POA: Diagnosis not present

## 2020-11-19 DIAGNOSIS — N186 End stage renal disease: Secondary | ICD-10-CM | POA: Diagnosis not present

## 2020-11-19 DIAGNOSIS — N2581 Secondary hyperparathyroidism of renal origin: Secondary | ICD-10-CM | POA: Diagnosis not present

## 2020-11-19 DIAGNOSIS — Z992 Dependence on renal dialysis: Secondary | ICD-10-CM | POA: Diagnosis not present

## 2020-11-22 DIAGNOSIS — Z992 Dependence on renal dialysis: Secondary | ICD-10-CM | POA: Diagnosis not present

## 2020-11-22 DIAGNOSIS — N2581 Secondary hyperparathyroidism of renal origin: Secondary | ICD-10-CM | POA: Diagnosis not present

## 2020-11-22 DIAGNOSIS — N186 End stage renal disease: Secondary | ICD-10-CM | POA: Diagnosis not present

## 2020-11-23 DIAGNOSIS — I120 Hypertensive chronic kidney disease with stage 5 chronic kidney disease or end stage renal disease: Secondary | ICD-10-CM | POA: Diagnosis not present

## 2020-11-23 DIAGNOSIS — N186 End stage renal disease: Secondary | ICD-10-CM | POA: Diagnosis not present

## 2020-11-23 DIAGNOSIS — Z992 Dependence on renal dialysis: Secondary | ICD-10-CM | POA: Diagnosis not present

## 2020-11-24 DIAGNOSIS — N2581 Secondary hyperparathyroidism of renal origin: Secondary | ICD-10-CM | POA: Diagnosis not present

## 2020-11-24 DIAGNOSIS — N186 End stage renal disease: Secondary | ICD-10-CM | POA: Diagnosis not present

## 2020-11-24 DIAGNOSIS — Z992 Dependence on renal dialysis: Secondary | ICD-10-CM | POA: Diagnosis not present

## 2020-11-26 DIAGNOSIS — N186 End stage renal disease: Secondary | ICD-10-CM | POA: Diagnosis not present

## 2020-11-26 DIAGNOSIS — N2581 Secondary hyperparathyroidism of renal origin: Secondary | ICD-10-CM | POA: Diagnosis not present

## 2020-11-26 DIAGNOSIS — Z992 Dependence on renal dialysis: Secondary | ICD-10-CM | POA: Diagnosis not present

## 2020-11-29 DIAGNOSIS — N186 End stage renal disease: Secondary | ICD-10-CM | POA: Diagnosis not present

## 2020-11-29 DIAGNOSIS — Z992 Dependence on renal dialysis: Secondary | ICD-10-CM | POA: Diagnosis not present

## 2020-11-29 DIAGNOSIS — N2581 Secondary hyperparathyroidism of renal origin: Secondary | ICD-10-CM | POA: Diagnosis not present

## 2020-12-01 DIAGNOSIS — N2581 Secondary hyperparathyroidism of renal origin: Secondary | ICD-10-CM | POA: Diagnosis not present

## 2020-12-01 DIAGNOSIS — N186 End stage renal disease: Secondary | ICD-10-CM | POA: Diagnosis not present

## 2020-12-01 DIAGNOSIS — Z992 Dependence on renal dialysis: Secondary | ICD-10-CM | POA: Diagnosis not present

## 2020-12-03 DIAGNOSIS — N2581 Secondary hyperparathyroidism of renal origin: Secondary | ICD-10-CM | POA: Diagnosis not present

## 2020-12-03 DIAGNOSIS — N186 End stage renal disease: Secondary | ICD-10-CM | POA: Diagnosis not present

## 2020-12-03 DIAGNOSIS — Z992 Dependence on renal dialysis: Secondary | ICD-10-CM | POA: Diagnosis not present

## 2020-12-06 DIAGNOSIS — N186 End stage renal disease: Secondary | ICD-10-CM | POA: Diagnosis not present

## 2020-12-06 DIAGNOSIS — Z992 Dependence on renal dialysis: Secondary | ICD-10-CM | POA: Diagnosis not present

## 2020-12-06 DIAGNOSIS — N2581 Secondary hyperparathyroidism of renal origin: Secondary | ICD-10-CM | POA: Diagnosis not present

## 2020-12-08 DIAGNOSIS — N186 End stage renal disease: Secondary | ICD-10-CM | POA: Diagnosis not present

## 2020-12-08 DIAGNOSIS — N2581 Secondary hyperparathyroidism of renal origin: Secondary | ICD-10-CM | POA: Diagnosis not present

## 2020-12-08 DIAGNOSIS — Z992 Dependence on renal dialysis: Secondary | ICD-10-CM | POA: Diagnosis not present

## 2020-12-10 DIAGNOSIS — N186 End stage renal disease: Secondary | ICD-10-CM | POA: Diagnosis not present

## 2020-12-10 DIAGNOSIS — N2581 Secondary hyperparathyroidism of renal origin: Secondary | ICD-10-CM | POA: Diagnosis not present

## 2020-12-10 DIAGNOSIS — Z992 Dependence on renal dialysis: Secondary | ICD-10-CM | POA: Diagnosis not present

## 2020-12-13 DIAGNOSIS — N2581 Secondary hyperparathyroidism of renal origin: Secondary | ICD-10-CM | POA: Diagnosis not present

## 2020-12-13 DIAGNOSIS — Z992 Dependence on renal dialysis: Secondary | ICD-10-CM | POA: Diagnosis not present

## 2020-12-13 DIAGNOSIS — N186 End stage renal disease: Secondary | ICD-10-CM | POA: Diagnosis not present

## 2020-12-14 DIAGNOSIS — I5022 Chronic systolic (congestive) heart failure: Secondary | ICD-10-CM | POA: Diagnosis not present

## 2020-12-14 DIAGNOSIS — R0602 Shortness of breath: Secondary | ICD-10-CM | POA: Diagnosis not present

## 2020-12-14 DIAGNOSIS — G40009 Localization-related (focal) (partial) idiopathic epilepsy and epileptic syndromes with seizures of localized onset, not intractable, without status epilepticus: Secondary | ICD-10-CM | POA: Diagnosis not present

## 2020-12-14 DIAGNOSIS — I480 Paroxysmal atrial fibrillation: Secondary | ICD-10-CM | POA: Diagnosis not present

## 2020-12-14 DIAGNOSIS — N186 End stage renal disease: Secondary | ICD-10-CM | POA: Diagnosis not present

## 2020-12-14 DIAGNOSIS — E7849 Other hyperlipidemia: Secondary | ICD-10-CM | POA: Diagnosis not present

## 2020-12-15 DIAGNOSIS — N2581 Secondary hyperparathyroidism of renal origin: Secondary | ICD-10-CM | POA: Diagnosis not present

## 2020-12-15 DIAGNOSIS — N186 End stage renal disease: Secondary | ICD-10-CM | POA: Diagnosis not present

## 2020-12-15 DIAGNOSIS — Z992 Dependence on renal dialysis: Secondary | ICD-10-CM | POA: Diagnosis not present

## 2020-12-17 DIAGNOSIS — Z992 Dependence on renal dialysis: Secondary | ICD-10-CM | POA: Diagnosis not present

## 2020-12-17 DIAGNOSIS — N186 End stage renal disease: Secondary | ICD-10-CM | POA: Diagnosis not present

## 2020-12-17 DIAGNOSIS — N2581 Secondary hyperparathyroidism of renal origin: Secondary | ICD-10-CM | POA: Diagnosis not present

## 2020-12-20 DIAGNOSIS — Z992 Dependence on renal dialysis: Secondary | ICD-10-CM | POA: Diagnosis not present

## 2020-12-20 DIAGNOSIS — N186 End stage renal disease: Secondary | ICD-10-CM | POA: Diagnosis not present

## 2020-12-20 DIAGNOSIS — N2581 Secondary hyperparathyroidism of renal origin: Secondary | ICD-10-CM | POA: Diagnosis not present

## 2020-12-22 DIAGNOSIS — N2581 Secondary hyperparathyroidism of renal origin: Secondary | ICD-10-CM | POA: Diagnosis not present

## 2020-12-22 DIAGNOSIS — N186 End stage renal disease: Secondary | ICD-10-CM | POA: Diagnosis not present

## 2020-12-22 DIAGNOSIS — Z992 Dependence on renal dialysis: Secondary | ICD-10-CM | POA: Diagnosis not present

## 2020-12-23 DIAGNOSIS — N186 End stage renal disease: Secondary | ICD-10-CM | POA: Diagnosis not present

## 2020-12-23 DIAGNOSIS — I120 Hypertensive chronic kidney disease with stage 5 chronic kidney disease or end stage renal disease: Secondary | ICD-10-CM | POA: Diagnosis not present

## 2020-12-23 DIAGNOSIS — Z992 Dependence on renal dialysis: Secondary | ICD-10-CM | POA: Diagnosis not present

## 2020-12-24 DIAGNOSIS — N186 End stage renal disease: Secondary | ICD-10-CM | POA: Diagnosis not present

## 2020-12-24 DIAGNOSIS — N2581 Secondary hyperparathyroidism of renal origin: Secondary | ICD-10-CM | POA: Diagnosis not present

## 2020-12-24 DIAGNOSIS — Z992 Dependence on renal dialysis: Secondary | ICD-10-CM | POA: Diagnosis not present

## 2020-12-27 DIAGNOSIS — N2581 Secondary hyperparathyroidism of renal origin: Secondary | ICD-10-CM | POA: Diagnosis not present

## 2020-12-27 DIAGNOSIS — N186 End stage renal disease: Secondary | ICD-10-CM | POA: Diagnosis not present

## 2020-12-27 DIAGNOSIS — Z992 Dependence on renal dialysis: Secondary | ICD-10-CM | POA: Diagnosis not present

## 2020-12-29 DIAGNOSIS — N2581 Secondary hyperparathyroidism of renal origin: Secondary | ICD-10-CM | POA: Diagnosis not present

## 2020-12-29 DIAGNOSIS — N186 End stage renal disease: Secondary | ICD-10-CM | POA: Diagnosis not present

## 2020-12-29 DIAGNOSIS — Z992 Dependence on renal dialysis: Secondary | ICD-10-CM | POA: Diagnosis not present

## 2020-12-29 NOTE — Progress Notes (Signed)
PATIENT: Peter Sloan DOB: 05-29-1960  REASON FOR VISIT: follow up HISTORY FROM: patient  Chief Complaint  Patient presents with   Follow-up    Pt alone, rm 11. Presents today for follow up. Overall doing well. No seizures      HISTORY OF PRESENT ILLNESS: 01/04/21 ALL:  Peter Sloan returns for follow up for seizures. He continues levetiracetam '500mg'$  BID, lamotrigine '25mg'$  BID and divalproex ER '250mg'$  QD. He is tolerating medications well. No seizure activity since 2014. He continues dialysis (previous failed transplant). He has moved to [place]. He lives with his wife and son. Drives without difficulty.   09/23/2019 ALL:  Peter Sloan is a 60 y.o. male here today for follow up for seizures. He continues levetiracetam '500mg'$  BID, lamotrigine '25mg'$  BID and divalproex ER '250mg'$  daily. He is doing well and tolerating medications with no adverse effects. Last seizure in 2014. He recently lost his brother and father but feels he is holding up well. He lives with his wife and son. He is driving without difficulty. He continues dialysis on Tu, Th and Sat.   09/22/2018 ALL: Peter Sloan is a 60 y.o. male here today for follow up for seizure. He continues levetiracetam '500mg'$  twice daily, lamtrogine '25mg'$  twice daily and divalproex '250mg'$  twice daily. He is doing very well without adverse effects. He continues dialysis on Tu, Th and Sa. 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 Dry weight 70.5KG.      History (copied from Dr Gladstone Lighter note on 09/18/2017)   UPDATE (09/18/17, VRP): Since last visit, doing well. Tolerating meds. No alleviating or aggravating factors. No seizures.   UPDATE 09/17/16: Since last visit, tolerating meds. No seizures. Notes some changes in urination related to his keppra medication.    UPDATE 08/17/15: Since last visit, no seizures. Tolerating medications.    UPDATE 08/16/14: Since last visit, doing well. No seizures. Tolerating meds.    UPDATE  02/10/14: Since last visit, doing well. No tremors. Hair is growing back thicker. No seizures. Tolerating meds.   UPDATE 08/07/13 (LL): Since last visit no further seizures, doing well. No noted tremors.  On depakote ER '250mg'$  daily, lamotrigine '25mg'$  BID, and levetiracetam '250mg'$  BID.  Since Depakote dose has been reduced his hair has returned. iPTH level rose from 126 to 927 since Dec 2014.  No new complaints.    UPDATE 06/05/13 (LL): Since last visit, no further seizures. Tremors are gone. On depakote ER '250mg'$  BID, lamotrigine '25mg'$  BID, and levetiracetam '250mg'$  BID. Exercising daily at the [facility], feels good.    UPDATE 01/05/13 (VP): Since last visit, no further seizures. Kidney transplant failed unfortunately. Tremors are better. On depakote ER '250mg'$  BID, lamotrigine '25mg'$  BID, and levetiracetam '250mg'$  BID.    UPDATE 09/16/12: Patient had kidney transplant on Aug 14, 2012. Hospital admission at [facility] was 10 days. Ureter stent was placed during transplant surgery which had to be replaced and prolonged the admission. Discharged with foley catheter, he could not void in 6 hours so foley was replaced. He currently has foley. Kidney is functioning, no more HD needed. He is on week 3 of 6 weeks of in-home therapy. Ambulates with walker but needed wheelchair for long distances. Has had 9 falls in 12 days. Appears very deconditioned. Tremors are worse since surgery, affecting writing, feeding self. Currently on VPA '1000mg'$  BID and LTG '25mg'$  BID     UPDATE 07/28/12: Since last visit, had breakthrough sz after dialysis session (told me  that double volume was removed inadvertantly). Since then I re-adjusted his meds to VPA '1000mg'$  BID and LTG '50mg'$  BID. Now in review of records, I think he may still be on '25mg'$  BID, but he is not sure.    UPDATE 05/28/12: Since last visit, no sz. Tolerating meds. No new events.     UPDATE 02/20/12: Since last visit, now on dialysis. Also, had breakthrough sz on 01/11/12 (admitted x 1 day). Now  on incr VPA '750mg'$  BID and continues on LTG '25mg'$  BID. No triggering factors for breakthrough sz.     UPDATE 06/26/11: Last convulsive seizure was in February 2012. Most recent abnormal spell was 05/05/2011, following a stress test. He had been laying down at home, felt a abnormal sensation in the left occipital region with a pop sensation, followed by a rush sensation in his face down to his stomach with a sour stomach sensation. He thinks this may have been a [place]it mall seizure. He was fully aware and conscious during the episode. Episode was similar but somewhat different than his prior episodes. In response to this event he was started on low dose Lamictal 25 mg twice a day. Since that time he has had no further events.     PRIOR HPI: 35 -year-old, right handed, Caucasian male who returns today for followup for history of longstanding seizure disorder. He has a single kidney with renal dysfunction. 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 He has never required dialysis however he is on the transplant list at [facility]. His last major seizure was 05/15/10 and seen in the ER. No loss of B/B, did not bite his tongue. He has had a total of 2 seizures over the last few years. He has had problems with thrombocytopenia in the past . His Depakote dose was reduced and he was seen by a hematologist. The platelet count has returned to normal. He has history of HTN, atrial fib, hypercholesterolemia , and renal failure. Recent labs reviewed from Dr. Deterding's office. Lamictal added at last visit, low dose. No further seizure activity. See ROS.    REVIEW OF SYSTEMS: Out of a complete 14 system review of symptoms, the patient complains only of the following symptoms, none and all other reviewed systems are negative.  ALLERGIES: Allergies  Allergen Reactions   Diphenhydramine Hcl Palpitations    HOME MEDICATIONS: Outpatient Medications Prior to Visit  Medication Sig Dispense Refill   acetaminophen  (TYLENOL) 325 MG tablet Take 2 tablets (650 mg total) by mouth every 6 (six) hours as needed for mild pain (or Fever >/= 101).     allopurinol (ZYLOPRIM) 300 MG tablet Take 300 mg by mouth daily.     amiodarone (PACERONE) 200 MG tablet Take 200 mg by mouth daily.     Ascorbic Acid (VITAMIN C) 1000 MG tablet Take 1,000 mg by mouth daily.     AURYXIA 1 GM 210 MG(Fe) tablet Take 420 mg by mouth in the morning and at bedtime.     b complex-vitamin c-folic acid (NEPHRO-VITE) 0.8 MG TABS tablet Take 0.8 mg by mouth daily.      metoprolol tartrate (LOPRESSOR) 25 MG tablet Take 25 mg by mouth every evening.     SENSIPAR 60 MG tablet Take 180 mg by mouth See admin instructions. Take one tablet by mouth on Tuesdays, Thursdays, Saturdays  3   VITAMIN D PO Take 1 tablet by mouth See admin instructions. Take one tablet by mouth on Tuesdays, Thursdays, Saturdays  warfarin (COUMADIN) 2 MG tablet Take 1 tablet (2 mg total) by mouth daily at 4 PM. Take '2mg'$  daily until FU with Dr.Kadakia and INR check on Monday 9/20 10 tablet 0   divalproex (DEPAKOTE ER) 250 MG 24 hr tablet Take 1 tablet (250 mg total) by mouth at bedtime. 90 tablet 0   lamoTRIgine (LAMICTAL) 25 MG tablet Take 1 tablet (25 mg total) by mouth 2 (two) times daily. Please keep upcoming appointment for further refills 180 tablet 0   levETIRAcetam (KEPPRA) 500 MG tablet Take 1 tablet (500 mg total) by mouth 2 (two) times daily. Please keep upcoming appointment for further refills 180 tablet 0   No facility-administered medications prior to visit.    PAST MEDICAL HISTORY: Past Medical History:  Diagnosis Date   Atrial fibrillation ([place])    Chronic renal insufficiency    2/2 ischemic ATN, GFR 15-17 ml/min, baseline Cr: 4-4.5, never had a renal biopsy, s/p AV fistula placement in 2007, f/b nephrologist, Dr. Dorothe Pea, [place]   Congenital solitary kidney    Congestive heart failure ([place])    End stage renal disease on dialysis [facility])     fistula left arm, dialysis on Tues, Thurs, Sat.   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   Seizure disorder ([place])    complex- partial, h/o sz disorder as a child   Seizures ([place])    last sz 07/05/12 grand mal   Severe protein-calorie malnutrition ([place])    Thyroid disorder     PAST SURGICAL HISTORY: Past Surgical History:  Procedure Laterality Date   HERNIA REPAIR     KIDNEY SURGERY     KIDNEY TRANSPLANT  08/14/12   NEPHRECTOMY TRANSPLANTED ORGAN  10/14/12   RIGHT/LEFT HEART CATH AND CORONARY ANGIOGRAPHY N/A 12/08/2019   Procedure: RIGHT/LEFT HEART CATH AND CORONARY ANGIOGRAPHY;  Surgeon: Dixie Dials, MD;  Location: [facility];  Service: Cardiovascular;  Laterality: N/A;   TONSILLECTOMY      FAMILY HISTORY: Family History  Problem Relation Age of Onset   Osteoarthritis Mother    Hypertension Father    Muscular dystrophy Brother    Heart disease Child        one 56 y/o son has MVP    SOCIAL HISTORY: Social History   Socioeconomic History   Marital status: Married    Spouse name: Lattie Haw   Number of children: 1   Years of education: HS   Highest education level: Not on file  Occupational History   Occupation: Currently unemployed    Employer: DISABLED    Comment: 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   Lives with parents in [place].   Has been married for 2yr.                  Social Determinants of Health   Financial Resource Strain: Not on file  Food Insecurity: Not on file  Transportation Needs: Not on file  Physical Activity: Not on file  Stress: Not on file  Social Connections: Not on file  Intimate Partner Violence: Not on file      PHYSICAL EXAM  Vitals:   01/04/21 0726  BP: 101/70  Pulse: 76  Weight: 143 lb (64.9 kg)  Height: '5\' 3"'$  (1.6 m)    Body mass index is 25.33 kg/m.  Generalized: Well developed, in no acute distress  Cardiology: irregularly, irregular rhythm  Respiratory: clear to auscultation bilaterally  Neurological examination  Mentation: Alert oriented to time, place, history taking. Follows all commands speech and language fluent Cranial nerve II-XII: Pupils were equal round reactive to light. Extraocular movements were full, visual field were full on confrontational test. Facial sensation and strength were normal. Head turning and shoulder shrug  were normal and symmetric. Motor: The motor testing reveals 5 over 5 strength of all 4 extremities. Good symmetric motor tone is noted throughout.  Sensory: Sensory testing is intact to soft touch on all 4 extremities. No evidence of extinction is noted.  Coordination: Cerebellar testing reveals good finger-nose-finger and heel-to-shin bilaterally.  Gait and station: Gait is normal.   DIAGNOSTIC DATA (LABS, IMAGING, TESTING) - I reviewed patient records, labs, notes, testing and imaging myself where available.  No flowsheet data found.   Lab Results  Component Value Date   WBC 9.5 12/11/2019   HGB 10.5 (L) 12/11/2019   HCT 31.8 (L) 12/11/2019   MCV 101.9 (H) 12/11/2019   PLT 267  12/11/2019      Component Value Date/Time   NA 134 (L) 12/11/2019 0755   NA 147 (H) 09/09/2008 1450   K 4.2 12/11/2019 0755   K 5.0 (H) 09/09/2008 1450   CL 96 (L) 12/11/2019 0755   CL 111 (H) 09/09/2008 1450   CO2 25 12/11/2019 0755   CO2 26 09/09/2008 1450   GLUCOSE 139 (H) 12/11/2019 0755   GLUCOSE 106 09/09/2008 1450   BUN 33 (H) 12/11/2019 0755   BUN 51 (H) 09/09/2008 1450   CREATININE 5.74 (H) 12/11/2019 0755   CREATININE 4.7 (H) 09/09/2008 1450   CALCIUM 9.6 12/11/2019 0755   CALCIUM 10.8 (H) 09/09/2008 1450   PROT 4.7 (L) 09/16/2012 1453   PROT 6.4 09/09/2008 1450   ALBUMIN 2.6 (L) 12/07/2019 1336   ALBUMIN 3.2 (L) 09/16/2012 1453   AST 27 09/16/2012 1453   AST 24 09/09/2008 1450   ALT 45 (H) 09/16/2012 1453   ALT 21 09/09/2008 1450   ALKPHOS 68 09/16/2012 1453   ALKPHOS 38 09/09/2008 1450   BILITOT 0.3 09/16/2012 1453   BILITOT 0.70 09/09/2008 1450   GFRNONAA 10 (L) 12/11/2019 0755   GFRAA 12 (L) 12/11/2019 0755   Lab Results  Component Value Date   CHOL 171 08/17/2008   HDL 41 08/17/2008   LDLCALC 107 (H) 08/17/2008   TRIG 115 08/17/2008   CHOLHDL 4.2 Ratio 08/17/2008   No results found for: HGBA1C No results found for: VITAMINB12 Lab Results  Component Value Date   TSH 2.046 01/13/2012  ASSESSMENT AND PLAN 60 y.o. 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 here with     ICD-10-CM   1. Seizure disorder ([place])  G40.909 Valproic Acid Level    Lamotrigine  level       Peter Sloan is doing well today. He continues levetiracetam '500mg'$  BID, lamortigine '25mg'$  BID and divalproex ER '250mg'$  at bedtime. He was advised to continue current regimen. We will update labs. Orders placed today and will be drawn at dialysis. He was encouraged to stay well hydrated, eat a well balanced diet and stay active. He will follow up with me in 1 year, sooner if needed. He verbalizes understanding and agreement with this plan.    Orders Placed This Encounter  Procedures   Valproic Acid Level    Standing Status:   Future    Standing Expiration Date:   01/04/2022   Lamotrigine level    Standing Status:   Future    Standing Expiration Date:   01/04/2022      Meds ordered this encounter  Medications   divalproex (DEPAKOTE ER) 250 MG 24 hr tablet    Sig: Take 1 tablet (250 mg total) by mouth at bedtime.    Dispense:  90 tablet    Refill:  3    Order Specific Question:   Supervising Provider    Answer:   Melvenia Beam [id number]   lamoTRIgine (LAMICTAL) 25 MG tablet    Sig: Take 1 tablet (25 mg total) by mouth 2 (two) times daily.    Dispense:  180 tablet    Refill:  3    Order Specific Question:   Supervising Provider    Answer:   Melvenia Beam [id number]   levETIRAcetam (KEPPRA) 500 MG tablet    Sig: Take 1 tablet (500 mg total) by mouth 2 (two) times daily.    Dispense:  180 tablet    Refill:  3    Order Specific Question:   Supervising Provider    Answer:   Bess Harvest, FNP-C 01/04/2021, 7:47 AM [facility] [address], [place], [place] [address] [phone]

## 2020-12-29 NOTE — Patient Instructions (Signed)
Below is our plan:  We will continue levetiracetam '500mg'$  twice daily, lamotrigine '25mg'$  twice daily and divalproex ER '250mg'$  daily.  Please make sure you are staying well hydrated. I recommend 50-60 ounces daily. Well balanced diet and regular exercise encouraged. Consistent sleep schedule with 6-8 hours recommended.   Please continue follow up with care team as directed.   Follow up with me in  year   You may receive a survey regarding today's visit. I encourage you to leave honest feed back as I do use this information to improve patient care. Thank you for seeing me today!

## 2020-12-31 DIAGNOSIS — N186 End stage renal disease: Secondary | ICD-10-CM | POA: Diagnosis not present

## 2020-12-31 DIAGNOSIS — Z992 Dependence on renal dialysis: Secondary | ICD-10-CM | POA: Diagnosis not present

## 2020-12-31 DIAGNOSIS — N2581 Secondary hyperparathyroidism of renal origin: Secondary | ICD-10-CM | POA: Diagnosis not present

## 2021-01-03 DIAGNOSIS — N2581 Secondary hyperparathyroidism of renal origin: Secondary | ICD-10-CM | POA: Diagnosis not present

## 2021-01-03 DIAGNOSIS — N186 End stage renal disease: Secondary | ICD-10-CM | POA: Diagnosis not present

## 2021-01-03 DIAGNOSIS — Z992 Dependence on renal dialysis: Secondary | ICD-10-CM | POA: Diagnosis not present

## 2021-01-04 ENCOUNTER — Encounter: Payer: Self-pay | Admitting: Family Medicine

## 2021-01-04 ENCOUNTER — Ambulatory Visit (INDEPENDENT_AMBULATORY_CARE_PROVIDER_SITE_OTHER): Payer: Medicare HMO | Admitting: Family Medicine

## 2021-01-04 VITALS — BP 101/70 | HR 76 | Ht 63.0 in | Wt 143.0 lb

## 2021-01-04 DIAGNOSIS — G40909 Epilepsy, unspecified, not intractable, without status epilepticus: Secondary | ICD-10-CM

## 2021-01-04 MED ORDER — DIVALPROEX SODIUM ER 250 MG PO TB24: 250.0000 mg | ORAL_TABLET | Freq: Every day | ORAL | 3 refills | Status: DC

## 2021-01-04 MED ORDER — LAMOTRIGINE 25 MG PO TABS: 25.0000 mg | ORAL_TABLET | Freq: Two times a day (BID) | ORAL | 3 refills | Status: DC

## 2021-01-04 MED ORDER — LEVETIRACETAM 500 MG PO TABS: 500.0000 mg | ORAL_TABLET | Freq: Two times a day (BID) | ORAL | 3 refills | Status: DC

## 2021-01-05 DIAGNOSIS — N186 End stage renal disease: Secondary | ICD-10-CM | POA: Diagnosis not present

## 2021-01-05 DIAGNOSIS — N2581 Secondary hyperparathyroidism of renal origin: Secondary | ICD-10-CM | POA: Diagnosis not present

## 2021-01-05 DIAGNOSIS — Z992 Dependence on renal dialysis: Secondary | ICD-10-CM | POA: Diagnosis not present

## 2021-01-07 DIAGNOSIS — N186 End stage renal disease: Secondary | ICD-10-CM | POA: Diagnosis not present

## 2021-01-07 DIAGNOSIS — Z992 Dependence on renal dialysis: Secondary | ICD-10-CM | POA: Diagnosis not present

## 2021-01-07 DIAGNOSIS — N2581 Secondary hyperparathyroidism of renal origin: Secondary | ICD-10-CM | POA: Diagnosis not present

## 2021-01-10 DIAGNOSIS — N2581 Secondary hyperparathyroidism of renal origin: Secondary | ICD-10-CM | POA: Diagnosis not present

## 2021-01-10 DIAGNOSIS — N186 End stage renal disease: Secondary | ICD-10-CM | POA: Diagnosis not present

## 2021-01-10 DIAGNOSIS — Z992 Dependence on renal dialysis: Secondary | ICD-10-CM | POA: Diagnosis not present

## 2021-01-12 DIAGNOSIS — Z992 Dependence on renal dialysis: Secondary | ICD-10-CM | POA: Diagnosis not present

## 2021-01-12 DIAGNOSIS — N2581 Secondary hyperparathyroidism of renal origin: Secondary | ICD-10-CM | POA: Diagnosis not present

## 2021-01-12 DIAGNOSIS — N186 End stage renal disease: Secondary | ICD-10-CM | POA: Diagnosis not present

## 2021-01-14 DIAGNOSIS — N186 End stage renal disease: Secondary | ICD-10-CM | POA: Diagnosis not present

## 2021-01-14 DIAGNOSIS — Z992 Dependence on renal dialysis: Secondary | ICD-10-CM | POA: Diagnosis not present

## 2021-01-14 DIAGNOSIS — N2581 Secondary hyperparathyroidism of renal origin: Secondary | ICD-10-CM | POA: Diagnosis not present

## 2021-01-17 DIAGNOSIS — N2581 Secondary hyperparathyroidism of renal origin: Secondary | ICD-10-CM | POA: Diagnosis not present

## 2021-01-17 DIAGNOSIS — N186 End stage renal disease: Secondary | ICD-10-CM | POA: Diagnosis not present

## 2021-01-17 DIAGNOSIS — Z992 Dependence on renal dialysis: Secondary | ICD-10-CM | POA: Diagnosis not present

## 2021-01-19 DIAGNOSIS — Z992 Dependence on renal dialysis: Secondary | ICD-10-CM | POA: Diagnosis not present

## 2021-01-19 DIAGNOSIS — N186 End stage renal disease: Secondary | ICD-10-CM | POA: Diagnosis not present

## 2021-01-19 DIAGNOSIS — N2581 Secondary hyperparathyroidism of renal origin: Secondary | ICD-10-CM | POA: Diagnosis not present

## 2021-01-21 DIAGNOSIS — N2581 Secondary hyperparathyroidism of renal origin: Secondary | ICD-10-CM | POA: Diagnosis not present

## 2021-01-21 DIAGNOSIS — Z992 Dependence on renal dialysis: Secondary | ICD-10-CM | POA: Diagnosis not present

## 2021-01-21 DIAGNOSIS — N186 End stage renal disease: Secondary | ICD-10-CM | POA: Diagnosis not present

## 2021-01-23 DIAGNOSIS — N186 End stage renal disease: Secondary | ICD-10-CM | POA: Diagnosis not present

## 2021-01-23 DIAGNOSIS — I120 Hypertensive chronic kidney disease with stage 5 chronic kidney disease or end stage renal disease: Secondary | ICD-10-CM | POA: Diagnosis not present

## 2021-01-23 DIAGNOSIS — Z992 Dependence on renal dialysis: Secondary | ICD-10-CM | POA: Diagnosis not present

## 2021-01-24 DIAGNOSIS — N2581 Secondary hyperparathyroidism of renal origin: Secondary | ICD-10-CM | POA: Diagnosis not present

## 2021-01-24 DIAGNOSIS — N186 End stage renal disease: Secondary | ICD-10-CM | POA: Diagnosis not present

## 2021-01-24 DIAGNOSIS — Z992 Dependence on renal dialysis: Secondary | ICD-10-CM | POA: Diagnosis not present

## 2021-01-26 DIAGNOSIS — N2581 Secondary hyperparathyroidism of renal origin: Secondary | ICD-10-CM | POA: Diagnosis not present

## 2021-01-26 DIAGNOSIS — Z992 Dependence on renal dialysis: Secondary | ICD-10-CM | POA: Diagnosis not present

## 2021-01-26 DIAGNOSIS — N186 End stage renal disease: Secondary | ICD-10-CM | POA: Diagnosis not present

## 2021-01-28 DIAGNOSIS — Z992 Dependence on renal dialysis: Secondary | ICD-10-CM | POA: Diagnosis not present

## 2021-01-28 DIAGNOSIS — N186 End stage renal disease: Secondary | ICD-10-CM | POA: Diagnosis not present

## 2021-01-28 DIAGNOSIS — N2581 Secondary hyperparathyroidism of renal origin: Secondary | ICD-10-CM | POA: Diagnosis not present

## 2021-01-31 DIAGNOSIS — Z992 Dependence on renal dialysis: Secondary | ICD-10-CM | POA: Diagnosis not present

## 2021-01-31 DIAGNOSIS — N186 End stage renal disease: Secondary | ICD-10-CM | POA: Diagnosis not present

## 2021-01-31 DIAGNOSIS — N2581 Secondary hyperparathyroidism of renal origin: Secondary | ICD-10-CM | POA: Diagnosis not present

## 2021-02-02 DIAGNOSIS — Z992 Dependence on renal dialysis: Secondary | ICD-10-CM | POA: Diagnosis not present

## 2021-02-02 DIAGNOSIS — N186 End stage renal disease: Secondary | ICD-10-CM | POA: Diagnosis not present

## 2021-02-02 DIAGNOSIS — N2581 Secondary hyperparathyroidism of renal origin: Secondary | ICD-10-CM | POA: Diagnosis not present

## 2021-02-04 DIAGNOSIS — N2581 Secondary hyperparathyroidism of renal origin: Secondary | ICD-10-CM | POA: Diagnosis not present

## 2021-02-04 DIAGNOSIS — Z992 Dependence on renal dialysis: Secondary | ICD-10-CM | POA: Diagnosis not present

## 2021-02-04 DIAGNOSIS — N186 End stage renal disease: Secondary | ICD-10-CM | POA: Diagnosis not present

## 2021-02-07 DIAGNOSIS — N2581 Secondary hyperparathyroidism of renal origin: Secondary | ICD-10-CM | POA: Diagnosis not present

## 2021-02-07 DIAGNOSIS — Z992 Dependence on renal dialysis: Secondary | ICD-10-CM | POA: Diagnosis not present

## 2021-02-07 DIAGNOSIS — N186 End stage renal disease: Secondary | ICD-10-CM | POA: Diagnosis not present

## 2021-02-09 DIAGNOSIS — Z992 Dependence on renal dialysis: Secondary | ICD-10-CM | POA: Diagnosis not present

## 2021-02-09 DIAGNOSIS — N2581 Secondary hyperparathyroidism of renal origin: Secondary | ICD-10-CM | POA: Diagnosis not present

## 2021-02-09 DIAGNOSIS — N186 End stage renal disease: Secondary | ICD-10-CM | POA: Diagnosis not present

## 2021-02-11 DIAGNOSIS — Z992 Dependence on renal dialysis: Secondary | ICD-10-CM | POA: Diagnosis not present

## 2021-02-11 DIAGNOSIS — N2581 Secondary hyperparathyroidism of renal origin: Secondary | ICD-10-CM | POA: Diagnosis not present

## 2021-02-11 DIAGNOSIS — N186 End stage renal disease: Secondary | ICD-10-CM | POA: Diagnosis not present

## 2021-02-14 DIAGNOSIS — Z992 Dependence on renal dialysis: Secondary | ICD-10-CM | POA: Diagnosis not present

## 2021-02-14 DIAGNOSIS — N186 End stage renal disease: Secondary | ICD-10-CM | POA: Diagnosis not present

## 2021-02-14 DIAGNOSIS — N2581 Secondary hyperparathyroidism of renal origin: Secondary | ICD-10-CM | POA: Diagnosis not present

## 2021-02-17 DIAGNOSIS — N2581 Secondary hyperparathyroidism of renal origin: Secondary | ICD-10-CM | POA: Diagnosis not present

## 2021-02-17 DIAGNOSIS — N186 End stage renal disease: Secondary | ICD-10-CM | POA: Diagnosis not present

## 2021-02-17 DIAGNOSIS — Z992 Dependence on renal dialysis: Secondary | ICD-10-CM | POA: Diagnosis not present

## 2021-02-19 DIAGNOSIS — N2581 Secondary hyperparathyroidism of renal origin: Secondary | ICD-10-CM | POA: Diagnosis not present

## 2021-02-19 DIAGNOSIS — Z992 Dependence on renal dialysis: Secondary | ICD-10-CM | POA: Diagnosis not present

## 2021-02-19 DIAGNOSIS — N186 End stage renal disease: Secondary | ICD-10-CM | POA: Diagnosis not present

## 2021-02-21 DIAGNOSIS — Z992 Dependence on renal dialysis: Secondary | ICD-10-CM | POA: Diagnosis not present

## 2021-02-21 DIAGNOSIS — N186 End stage renal disease: Secondary | ICD-10-CM | POA: Diagnosis not present

## 2021-02-21 DIAGNOSIS — N2581 Secondary hyperparathyroidism of renal origin: Secondary | ICD-10-CM | POA: Diagnosis not present

## 2021-02-22 DIAGNOSIS — Z992 Dependence on renal dialysis: Secondary | ICD-10-CM | POA: Diagnosis not present

## 2021-02-22 DIAGNOSIS — I120 Hypertensive chronic kidney disease with stage 5 chronic kidney disease or end stage renal disease: Secondary | ICD-10-CM | POA: Diagnosis not present

## 2021-02-22 DIAGNOSIS — N186 End stage renal disease: Secondary | ICD-10-CM | POA: Diagnosis not present

## 2021-02-23 DIAGNOSIS — Z992 Dependence on renal dialysis: Secondary | ICD-10-CM | POA: Diagnosis not present

## 2021-02-23 DIAGNOSIS — N186 End stage renal disease: Secondary | ICD-10-CM | POA: Diagnosis not present

## 2021-02-23 DIAGNOSIS — N2581 Secondary hyperparathyroidism of renal origin: Secondary | ICD-10-CM | POA: Diagnosis not present

## 2021-02-25 DIAGNOSIS — N186 End stage renal disease: Secondary | ICD-10-CM | POA: Diagnosis not present

## 2021-02-25 DIAGNOSIS — N2581 Secondary hyperparathyroidism of renal origin: Secondary | ICD-10-CM | POA: Diagnosis not present

## 2021-02-25 DIAGNOSIS — Z992 Dependence on renal dialysis: Secondary | ICD-10-CM | POA: Diagnosis not present

## 2021-03-02 DIAGNOSIS — Z992 Dependence on renal dialysis: Secondary | ICD-10-CM | POA: Diagnosis not present

## 2021-03-02 DIAGNOSIS — N186 End stage renal disease: Secondary | ICD-10-CM | POA: Diagnosis not present

## 2021-03-02 DIAGNOSIS — N2581 Secondary hyperparathyroidism of renal origin: Secondary | ICD-10-CM | POA: Diagnosis not present

## 2021-03-04 DIAGNOSIS — Z992 Dependence on renal dialysis: Secondary | ICD-10-CM | POA: Diagnosis not present

## 2021-03-04 DIAGNOSIS — N2581 Secondary hyperparathyroidism of renal origin: Secondary | ICD-10-CM | POA: Diagnosis not present

## 2021-03-04 DIAGNOSIS — N186 End stage renal disease: Secondary | ICD-10-CM | POA: Diagnosis not present

## 2021-03-07 DIAGNOSIS — N186 End stage renal disease: Secondary | ICD-10-CM | POA: Diagnosis not present

## 2021-03-07 DIAGNOSIS — N2581 Secondary hyperparathyroidism of renal origin: Secondary | ICD-10-CM | POA: Diagnosis not present

## 2021-03-07 DIAGNOSIS — Z992 Dependence on renal dialysis: Secondary | ICD-10-CM | POA: Diagnosis not present

## 2021-03-09 DIAGNOSIS — N2581 Secondary hyperparathyroidism of renal origin: Secondary | ICD-10-CM | POA: Diagnosis not present

## 2021-03-09 DIAGNOSIS — Z992 Dependence on renal dialysis: Secondary | ICD-10-CM | POA: Diagnosis not present

## 2021-03-09 DIAGNOSIS — N186 End stage renal disease: Secondary | ICD-10-CM | POA: Diagnosis not present

## 2021-03-11 DIAGNOSIS — Z992 Dependence on renal dialysis: Secondary | ICD-10-CM | POA: Diagnosis not present

## 2021-03-11 DIAGNOSIS — N2581 Secondary hyperparathyroidism of renal origin: Secondary | ICD-10-CM | POA: Diagnosis not present

## 2021-03-11 DIAGNOSIS — N186 End stage renal disease: Secondary | ICD-10-CM | POA: Diagnosis not present

## 2021-03-14 DIAGNOSIS — N186 End stage renal disease: Secondary | ICD-10-CM | POA: Diagnosis not present

## 2021-03-14 DIAGNOSIS — Z992 Dependence on renal dialysis: Secondary | ICD-10-CM | POA: Diagnosis not present

## 2021-03-14 DIAGNOSIS — N2581 Secondary hyperparathyroidism of renal origin: Secondary | ICD-10-CM | POA: Diagnosis not present

## 2021-03-16 DIAGNOSIS — N2581 Secondary hyperparathyroidism of renal origin: Secondary | ICD-10-CM | POA: Diagnosis not present

## 2021-03-16 DIAGNOSIS — Z992 Dependence on renal dialysis: Secondary | ICD-10-CM | POA: Diagnosis not present

## 2021-03-16 DIAGNOSIS — N186 End stage renal disease: Secondary | ICD-10-CM | POA: Diagnosis not present

## 2021-03-18 DIAGNOSIS — N186 End stage renal disease: Secondary | ICD-10-CM | POA: Diagnosis not present

## 2021-03-18 DIAGNOSIS — Z992 Dependence on renal dialysis: Secondary | ICD-10-CM | POA: Diagnosis not present

## 2021-03-18 DIAGNOSIS — N2581 Secondary hyperparathyroidism of renal origin: Secondary | ICD-10-CM | POA: Diagnosis not present

## 2021-03-21 DIAGNOSIS — N186 End stage renal disease: Secondary | ICD-10-CM | POA: Diagnosis not present

## 2021-03-21 DIAGNOSIS — Z992 Dependence on renal dialysis: Secondary | ICD-10-CM | POA: Diagnosis not present

## 2021-03-21 DIAGNOSIS — N2581 Secondary hyperparathyroidism of renal origin: Secondary | ICD-10-CM | POA: Diagnosis not present

## 2021-03-23 DIAGNOSIS — N186 End stage renal disease: Secondary | ICD-10-CM | POA: Diagnosis not present

## 2021-03-23 DIAGNOSIS — N2581 Secondary hyperparathyroidism of renal origin: Secondary | ICD-10-CM | POA: Diagnosis not present

## 2021-03-23 DIAGNOSIS — Z992 Dependence on renal dialysis: Secondary | ICD-10-CM | POA: Diagnosis not present

## 2021-03-25 DIAGNOSIS — N186 End stage renal disease: Secondary | ICD-10-CM | POA: Diagnosis not present

## 2021-03-25 DIAGNOSIS — N2581 Secondary hyperparathyroidism of renal origin: Secondary | ICD-10-CM | POA: Diagnosis not present

## 2021-03-25 DIAGNOSIS — I120 Hypertensive chronic kidney disease with stage 5 chronic kidney disease or end stage renal disease: Secondary | ICD-10-CM | POA: Diagnosis not present

## 2021-03-25 DIAGNOSIS — Z992 Dependence on renal dialysis: Secondary | ICD-10-CM | POA: Diagnosis not present

## 2021-03-28 DIAGNOSIS — N186 End stage renal disease: Secondary | ICD-10-CM | POA: Diagnosis not present

## 2021-03-28 DIAGNOSIS — Z992 Dependence on renal dialysis: Secondary | ICD-10-CM | POA: Diagnosis not present

## 2021-03-28 DIAGNOSIS — N2581 Secondary hyperparathyroidism of renal origin: Secondary | ICD-10-CM | POA: Diagnosis not present

## 2021-03-30 DIAGNOSIS — Z992 Dependence on renal dialysis: Secondary | ICD-10-CM | POA: Diagnosis not present

## 2021-03-30 DIAGNOSIS — N186 End stage renal disease: Secondary | ICD-10-CM | POA: Diagnosis not present

## 2021-03-30 DIAGNOSIS — N2581 Secondary hyperparathyroidism of renal origin: Secondary | ICD-10-CM | POA: Diagnosis not present

## 2021-04-01 DIAGNOSIS — N186 End stage renal disease: Secondary | ICD-10-CM | POA: Diagnosis not present

## 2021-04-01 DIAGNOSIS — N2581 Secondary hyperparathyroidism of renal origin: Secondary | ICD-10-CM | POA: Diagnosis not present

## 2021-04-01 DIAGNOSIS — Z992 Dependence on renal dialysis: Secondary | ICD-10-CM | POA: Diagnosis not present

## 2021-04-04 DIAGNOSIS — N2581 Secondary hyperparathyroidism of renal origin: Secondary | ICD-10-CM | POA: Diagnosis not present

## 2021-04-04 DIAGNOSIS — N186 End stage renal disease: Secondary | ICD-10-CM | POA: Diagnosis not present

## 2021-04-04 DIAGNOSIS — Z992 Dependence on renal dialysis: Secondary | ICD-10-CM | POA: Diagnosis not present

## 2021-04-06 DIAGNOSIS — N186 End stage renal disease: Secondary | ICD-10-CM | POA: Diagnosis not present

## 2021-04-06 DIAGNOSIS — N2581 Secondary hyperparathyroidism of renal origin: Secondary | ICD-10-CM | POA: Diagnosis not present

## 2021-04-06 DIAGNOSIS — Z992 Dependence on renal dialysis: Secondary | ICD-10-CM | POA: Diagnosis not present

## 2021-04-08 DIAGNOSIS — N2581 Secondary hyperparathyroidism of renal origin: Secondary | ICD-10-CM | POA: Diagnosis not present

## 2021-04-08 DIAGNOSIS — Z992 Dependence on renal dialysis: Secondary | ICD-10-CM | POA: Diagnosis not present

## 2021-04-08 DIAGNOSIS — N186 End stage renal disease: Secondary | ICD-10-CM | POA: Diagnosis not present

## 2021-04-11 DIAGNOSIS — N186 End stage renal disease: Secondary | ICD-10-CM | POA: Diagnosis not present

## 2021-04-11 DIAGNOSIS — N2581 Secondary hyperparathyroidism of renal origin: Secondary | ICD-10-CM | POA: Diagnosis not present

## 2021-04-11 DIAGNOSIS — Z992 Dependence on renal dialysis: Secondary | ICD-10-CM | POA: Diagnosis not present

## 2021-04-13 DIAGNOSIS — N186 End stage renal disease: Secondary | ICD-10-CM | POA: Diagnosis not present

## 2021-04-13 DIAGNOSIS — N2581 Secondary hyperparathyroidism of renal origin: Secondary | ICD-10-CM | POA: Diagnosis not present

## 2021-04-13 DIAGNOSIS — Z992 Dependence on renal dialysis: Secondary | ICD-10-CM | POA: Diagnosis not present

## 2021-04-15 DIAGNOSIS — Z992 Dependence on renal dialysis: Secondary | ICD-10-CM | POA: Diagnosis not present

## 2021-04-15 DIAGNOSIS — N2581 Secondary hyperparathyroidism of renal origin: Secondary | ICD-10-CM | POA: Diagnosis not present

## 2021-04-15 DIAGNOSIS — N186 End stage renal disease: Secondary | ICD-10-CM | POA: Diagnosis not present

## 2021-04-18 DIAGNOSIS — N2581 Secondary hyperparathyroidism of renal origin: Secondary | ICD-10-CM | POA: Diagnosis not present

## 2021-04-18 DIAGNOSIS — Z992 Dependence on renal dialysis: Secondary | ICD-10-CM | POA: Diagnosis not present

## 2021-04-18 DIAGNOSIS — N186 End stage renal disease: Secondary | ICD-10-CM | POA: Diagnosis not present

## 2021-04-20 DIAGNOSIS — N186 End stage renal disease: Secondary | ICD-10-CM | POA: Diagnosis not present

## 2021-04-20 DIAGNOSIS — Z992 Dependence on renal dialysis: Secondary | ICD-10-CM | POA: Diagnosis not present

## 2021-04-20 DIAGNOSIS — N2581 Secondary hyperparathyroidism of renal origin: Secondary | ICD-10-CM | POA: Diagnosis not present

## 2021-04-22 DIAGNOSIS — N2581 Secondary hyperparathyroidism of renal origin: Secondary | ICD-10-CM | POA: Diagnosis not present

## 2021-04-22 DIAGNOSIS — Z992 Dependence on renal dialysis: Secondary | ICD-10-CM | POA: Diagnosis not present

## 2021-04-22 DIAGNOSIS — N186 End stage renal disease: Secondary | ICD-10-CM | POA: Diagnosis not present

## 2021-04-25 DIAGNOSIS — Z992 Dependence on renal dialysis: Secondary | ICD-10-CM | POA: Diagnosis not present

## 2021-04-25 DIAGNOSIS — I120 Hypertensive chronic kidney disease with stage 5 chronic kidney disease or end stage renal disease: Secondary | ICD-10-CM | POA: Diagnosis not present

## 2021-04-25 DIAGNOSIS — N2581 Secondary hyperparathyroidism of renal origin: Secondary | ICD-10-CM | POA: Diagnosis not present

## 2021-04-25 DIAGNOSIS — N186 End stage renal disease: Secondary | ICD-10-CM | POA: Diagnosis not present

## 2021-04-26 DIAGNOSIS — R0602 Shortness of breath: Secondary | ICD-10-CM | POA: Diagnosis not present

## 2021-04-26 DIAGNOSIS — I480 Paroxysmal atrial fibrillation: Secondary | ICD-10-CM | POA: Diagnosis not present

## 2021-04-26 DIAGNOSIS — I5022 Chronic systolic (congestive) heart failure: Secondary | ICD-10-CM | POA: Diagnosis not present

## 2021-04-26 DIAGNOSIS — N186 End stage renal disease: Secondary | ICD-10-CM | POA: Diagnosis not present

## 2021-04-27 DIAGNOSIS — N186 End stage renal disease: Secondary | ICD-10-CM | POA: Diagnosis not present

## 2021-04-27 DIAGNOSIS — N2581 Secondary hyperparathyroidism of renal origin: Secondary | ICD-10-CM | POA: Diagnosis not present

## 2021-04-27 DIAGNOSIS — Z992 Dependence on renal dialysis: Secondary | ICD-10-CM | POA: Diagnosis not present

## 2021-05-02 DIAGNOSIS — N186 End stage renal disease: Secondary | ICD-10-CM | POA: Diagnosis not present

## 2021-05-02 DIAGNOSIS — N2581 Secondary hyperparathyroidism of renal origin: Secondary | ICD-10-CM | POA: Diagnosis not present

## 2021-05-02 DIAGNOSIS — Z992 Dependence on renal dialysis: Secondary | ICD-10-CM | POA: Diagnosis not present

## 2021-05-04 DIAGNOSIS — N186 End stage renal disease: Secondary | ICD-10-CM | POA: Diagnosis not present

## 2021-05-04 DIAGNOSIS — Z992 Dependence on renal dialysis: Secondary | ICD-10-CM | POA: Diagnosis not present

## 2021-05-04 DIAGNOSIS — N2581 Secondary hyperparathyroidism of renal origin: Secondary | ICD-10-CM | POA: Diagnosis not present

## 2021-05-06 DIAGNOSIS — N186 End stage renal disease: Secondary | ICD-10-CM | POA: Diagnosis not present

## 2021-05-06 DIAGNOSIS — N2581 Secondary hyperparathyroidism of renal origin: Secondary | ICD-10-CM | POA: Diagnosis not present

## 2021-05-06 DIAGNOSIS — Z992 Dependence on renal dialysis: Secondary | ICD-10-CM | POA: Diagnosis not present

## 2021-05-09 DIAGNOSIS — Z992 Dependence on renal dialysis: Secondary | ICD-10-CM | POA: Diagnosis not present

## 2021-05-09 DIAGNOSIS — N186 End stage renal disease: Secondary | ICD-10-CM | POA: Diagnosis not present

## 2021-05-09 DIAGNOSIS — N2581 Secondary hyperparathyroidism of renal origin: Secondary | ICD-10-CM | POA: Diagnosis not present

## 2021-05-11 DIAGNOSIS — N2581 Secondary hyperparathyroidism of renal origin: Secondary | ICD-10-CM | POA: Diagnosis not present

## 2021-05-11 DIAGNOSIS — Z992 Dependence on renal dialysis: Secondary | ICD-10-CM | POA: Diagnosis not present

## 2021-05-11 DIAGNOSIS — N186 End stage renal disease: Secondary | ICD-10-CM | POA: Diagnosis not present

## 2021-05-13 DIAGNOSIS — N2581 Secondary hyperparathyroidism of renal origin: Secondary | ICD-10-CM | POA: Diagnosis not present

## 2021-05-13 DIAGNOSIS — Z992 Dependence on renal dialysis: Secondary | ICD-10-CM | POA: Diagnosis not present

## 2021-05-13 DIAGNOSIS — N186 End stage renal disease: Secondary | ICD-10-CM | POA: Diagnosis not present

## 2021-05-16 DIAGNOSIS — Z992 Dependence on renal dialysis: Secondary | ICD-10-CM | POA: Diagnosis not present

## 2021-05-16 DIAGNOSIS — N2581 Secondary hyperparathyroidism of renal origin: Secondary | ICD-10-CM | POA: Diagnosis not present

## 2021-05-16 DIAGNOSIS — N186 End stage renal disease: Secondary | ICD-10-CM | POA: Diagnosis not present

## 2021-05-18 DIAGNOSIS — N186 End stage renal disease: Secondary | ICD-10-CM | POA: Diagnosis not present

## 2021-05-18 DIAGNOSIS — N2581 Secondary hyperparathyroidism of renal origin: Secondary | ICD-10-CM | POA: Diagnosis not present

## 2021-05-18 DIAGNOSIS — Z992 Dependence on renal dialysis: Secondary | ICD-10-CM | POA: Diagnosis not present

## 2021-05-20 DIAGNOSIS — N186 End stage renal disease: Secondary | ICD-10-CM | POA: Diagnosis not present

## 2021-05-20 DIAGNOSIS — Z992 Dependence on renal dialysis: Secondary | ICD-10-CM | POA: Diagnosis not present

## 2021-05-20 DIAGNOSIS — N2581 Secondary hyperparathyroidism of renal origin: Secondary | ICD-10-CM | POA: Diagnosis not present

## 2021-05-23 DIAGNOSIS — I120 Hypertensive chronic kidney disease with stage 5 chronic kidney disease or end stage renal disease: Secondary | ICD-10-CM | POA: Diagnosis not present

## 2021-05-23 DIAGNOSIS — Z992 Dependence on renal dialysis: Secondary | ICD-10-CM | POA: Diagnosis not present

## 2021-05-23 DIAGNOSIS — N2581 Secondary hyperparathyroidism of renal origin: Secondary | ICD-10-CM | POA: Diagnosis not present

## 2021-05-23 DIAGNOSIS — N186 End stage renal disease: Secondary | ICD-10-CM | POA: Diagnosis not present

## 2021-05-25 DIAGNOSIS — N2581 Secondary hyperparathyroidism of renal origin: Secondary | ICD-10-CM | POA: Diagnosis not present

## 2021-05-25 DIAGNOSIS — N186 End stage renal disease: Secondary | ICD-10-CM | POA: Diagnosis not present

## 2021-05-25 DIAGNOSIS — Z992 Dependence on renal dialysis: Secondary | ICD-10-CM | POA: Diagnosis not present

## 2021-05-27 DIAGNOSIS — Z992 Dependence on renal dialysis: Secondary | ICD-10-CM | POA: Diagnosis not present

## 2021-05-27 DIAGNOSIS — N186 End stage renal disease: Secondary | ICD-10-CM | POA: Diagnosis not present

## 2021-05-27 DIAGNOSIS — N2581 Secondary hyperparathyroidism of renal origin: Secondary | ICD-10-CM | POA: Diagnosis not present

## 2021-05-30 DIAGNOSIS — Z992 Dependence on renal dialysis: Secondary | ICD-10-CM | POA: Diagnosis not present

## 2021-05-30 DIAGNOSIS — N2581 Secondary hyperparathyroidism of renal origin: Secondary | ICD-10-CM | POA: Diagnosis not present

## 2021-05-30 DIAGNOSIS — N186 End stage renal disease: Secondary | ICD-10-CM | POA: Diagnosis not present

## 2021-06-01 DIAGNOSIS — Z992 Dependence on renal dialysis: Secondary | ICD-10-CM | POA: Diagnosis not present

## 2021-06-01 DIAGNOSIS — N186 End stage renal disease: Secondary | ICD-10-CM | POA: Diagnosis not present

## 2021-06-01 DIAGNOSIS — N2581 Secondary hyperparathyroidism of renal origin: Secondary | ICD-10-CM | POA: Diagnosis not present

## 2021-06-03 DIAGNOSIS — N2581 Secondary hyperparathyroidism of renal origin: Secondary | ICD-10-CM | POA: Diagnosis not present

## 2021-06-03 DIAGNOSIS — N186 End stage renal disease: Secondary | ICD-10-CM | POA: Diagnosis not present

## 2021-06-03 DIAGNOSIS — Z992 Dependence on renal dialysis: Secondary | ICD-10-CM | POA: Diagnosis not present

## 2021-06-06 DIAGNOSIS — N186 End stage renal disease: Secondary | ICD-10-CM | POA: Diagnosis not present

## 2021-06-06 DIAGNOSIS — Z992 Dependence on renal dialysis: Secondary | ICD-10-CM | POA: Diagnosis not present

## 2021-06-06 DIAGNOSIS — N2581 Secondary hyperparathyroidism of renal origin: Secondary | ICD-10-CM | POA: Diagnosis not present

## 2021-06-08 DIAGNOSIS — Z992 Dependence on renal dialysis: Secondary | ICD-10-CM | POA: Diagnosis not present

## 2021-06-08 DIAGNOSIS — N2581 Secondary hyperparathyroidism of renal origin: Secondary | ICD-10-CM | POA: Diagnosis not present

## 2021-06-08 DIAGNOSIS — N186 End stage renal disease: Secondary | ICD-10-CM | POA: Diagnosis not present

## 2021-06-10 DIAGNOSIS — Z992 Dependence on renal dialysis: Secondary | ICD-10-CM | POA: Diagnosis not present

## 2021-06-10 DIAGNOSIS — N2581 Secondary hyperparathyroidism of renal origin: Secondary | ICD-10-CM | POA: Diagnosis not present

## 2021-06-10 DIAGNOSIS — N186 End stage renal disease: Secondary | ICD-10-CM | POA: Diagnosis not present

## 2021-06-13 DIAGNOSIS — Z992 Dependence on renal dialysis: Secondary | ICD-10-CM | POA: Diagnosis not present

## 2021-06-13 DIAGNOSIS — N186 End stage renal disease: Secondary | ICD-10-CM | POA: Diagnosis not present

## 2021-06-13 DIAGNOSIS — N2581 Secondary hyperparathyroidism of renal origin: Secondary | ICD-10-CM | POA: Diagnosis not present

## 2021-06-15 DIAGNOSIS — N186 End stage renal disease: Secondary | ICD-10-CM | POA: Diagnosis not present

## 2021-06-15 DIAGNOSIS — Z992 Dependence on renal dialysis: Secondary | ICD-10-CM | POA: Diagnosis not present

## 2021-06-15 DIAGNOSIS — N2581 Secondary hyperparathyroidism of renal origin: Secondary | ICD-10-CM | POA: Diagnosis not present

## 2021-06-17 DIAGNOSIS — N2581 Secondary hyperparathyroidism of renal origin: Secondary | ICD-10-CM | POA: Diagnosis not present

## 2021-06-17 DIAGNOSIS — N186 End stage renal disease: Secondary | ICD-10-CM | POA: Diagnosis not present

## 2021-06-17 DIAGNOSIS — Z992 Dependence on renal dialysis: Secondary | ICD-10-CM | POA: Diagnosis not present

## 2021-06-20 DIAGNOSIS — N186 End stage renal disease: Secondary | ICD-10-CM | POA: Diagnosis not present

## 2021-06-20 DIAGNOSIS — N2581 Secondary hyperparathyroidism of renal origin: Secondary | ICD-10-CM | POA: Diagnosis not present

## 2021-06-20 DIAGNOSIS — Z992 Dependence on renal dialysis: Secondary | ICD-10-CM | POA: Diagnosis not present

## 2021-06-22 DIAGNOSIS — N186 End stage renal disease: Secondary | ICD-10-CM | POA: Diagnosis not present

## 2021-06-22 DIAGNOSIS — N2581 Secondary hyperparathyroidism of renal origin: Secondary | ICD-10-CM | POA: Diagnosis not present

## 2021-06-22 DIAGNOSIS — Z992 Dependence on renal dialysis: Secondary | ICD-10-CM | POA: Diagnosis not present

## 2021-06-23 DIAGNOSIS — Z992 Dependence on renal dialysis: Secondary | ICD-10-CM | POA: Diagnosis not present

## 2021-06-23 DIAGNOSIS — I120 Hypertensive chronic kidney disease with stage 5 chronic kidney disease or end stage renal disease: Secondary | ICD-10-CM | POA: Diagnosis not present

## 2021-06-23 DIAGNOSIS — N186 End stage renal disease: Secondary | ICD-10-CM | POA: Diagnosis not present

## 2021-06-24 DIAGNOSIS — N186 End stage renal disease: Secondary | ICD-10-CM | POA: Diagnosis not present

## 2021-06-24 DIAGNOSIS — Z992 Dependence on renal dialysis: Secondary | ICD-10-CM | POA: Diagnosis not present

## 2021-06-24 DIAGNOSIS — N2581 Secondary hyperparathyroidism of renal origin: Secondary | ICD-10-CM | POA: Diagnosis not present

## 2021-06-27 DIAGNOSIS — Z992 Dependence on renal dialysis: Secondary | ICD-10-CM | POA: Diagnosis not present

## 2021-06-27 DIAGNOSIS — N2581 Secondary hyperparathyroidism of renal origin: Secondary | ICD-10-CM | POA: Diagnosis not present

## 2021-06-27 DIAGNOSIS — N186 End stage renal disease: Secondary | ICD-10-CM | POA: Diagnosis not present

## 2021-06-29 DIAGNOSIS — Z992 Dependence on renal dialysis: Secondary | ICD-10-CM | POA: Diagnosis not present

## 2021-06-29 DIAGNOSIS — N186 End stage renal disease: Secondary | ICD-10-CM | POA: Diagnosis not present

## 2021-06-29 DIAGNOSIS — N2581 Secondary hyperparathyroidism of renal origin: Secondary | ICD-10-CM | POA: Diagnosis not present

## 2021-07-01 DIAGNOSIS — N186 End stage renal disease: Secondary | ICD-10-CM | POA: Diagnosis not present

## 2021-07-01 DIAGNOSIS — Z992 Dependence on renal dialysis: Secondary | ICD-10-CM | POA: Diagnosis not present

## 2021-07-01 DIAGNOSIS — N2581 Secondary hyperparathyroidism of renal origin: Secondary | ICD-10-CM | POA: Diagnosis not present

## 2021-07-04 DIAGNOSIS — Z992 Dependence on renal dialysis: Secondary | ICD-10-CM | POA: Diagnosis not present

## 2021-07-04 DIAGNOSIS — N186 End stage renal disease: Secondary | ICD-10-CM | POA: Diagnosis not present

## 2021-07-04 DIAGNOSIS — N2581 Secondary hyperparathyroidism of renal origin: Secondary | ICD-10-CM | POA: Diagnosis not present

## 2021-07-06 DIAGNOSIS — N186 End stage renal disease: Secondary | ICD-10-CM | POA: Diagnosis not present

## 2021-07-06 DIAGNOSIS — N2581 Secondary hyperparathyroidism of renal origin: Secondary | ICD-10-CM | POA: Diagnosis not present

## 2021-07-06 DIAGNOSIS — Z992 Dependence on renal dialysis: Secondary | ICD-10-CM | POA: Diagnosis not present

## 2021-07-08 DIAGNOSIS — N2581 Secondary hyperparathyroidism of renal origin: Secondary | ICD-10-CM | POA: Diagnosis not present

## 2021-07-08 DIAGNOSIS — N186 End stage renal disease: Secondary | ICD-10-CM | POA: Diagnosis not present

## 2021-07-08 DIAGNOSIS — Z992 Dependence on renal dialysis: Secondary | ICD-10-CM | POA: Diagnosis not present

## 2021-07-11 DIAGNOSIS — Z992 Dependence on renal dialysis: Secondary | ICD-10-CM | POA: Diagnosis not present

## 2021-07-11 DIAGNOSIS — N186 End stage renal disease: Secondary | ICD-10-CM | POA: Diagnosis not present

## 2021-07-11 DIAGNOSIS — N2581 Secondary hyperparathyroidism of renal origin: Secondary | ICD-10-CM | POA: Diagnosis not present

## 2021-07-13 DIAGNOSIS — Z992 Dependence on renal dialysis: Secondary | ICD-10-CM | POA: Diagnosis not present

## 2021-07-13 DIAGNOSIS — N186 End stage renal disease: Secondary | ICD-10-CM | POA: Diagnosis not present

## 2021-07-13 DIAGNOSIS — N2581 Secondary hyperparathyroidism of renal origin: Secondary | ICD-10-CM | POA: Diagnosis not present

## 2021-07-15 DIAGNOSIS — Z992 Dependence on renal dialysis: Secondary | ICD-10-CM | POA: Diagnosis not present

## 2021-07-15 DIAGNOSIS — N186 End stage renal disease: Secondary | ICD-10-CM | POA: Diagnosis not present

## 2021-07-15 DIAGNOSIS — N2581 Secondary hyperparathyroidism of renal origin: Secondary | ICD-10-CM | POA: Diagnosis not present

## 2021-07-18 DIAGNOSIS — Z992 Dependence on renal dialysis: Secondary | ICD-10-CM | POA: Diagnosis not present

## 2021-07-18 DIAGNOSIS — N2581 Secondary hyperparathyroidism of renal origin: Secondary | ICD-10-CM | POA: Diagnosis not present

## 2021-07-18 DIAGNOSIS — N186 End stage renal disease: Secondary | ICD-10-CM | POA: Diagnosis not present

## 2021-07-20 DIAGNOSIS — N2581 Secondary hyperparathyroidism of renal origin: Secondary | ICD-10-CM | POA: Diagnosis not present

## 2021-07-20 DIAGNOSIS — Z992 Dependence on renal dialysis: Secondary | ICD-10-CM | POA: Diagnosis not present

## 2021-07-20 DIAGNOSIS — N186 End stage renal disease: Secondary | ICD-10-CM | POA: Diagnosis not present

## 2021-07-22 DIAGNOSIS — N186 End stage renal disease: Secondary | ICD-10-CM | POA: Diagnosis not present

## 2021-07-22 DIAGNOSIS — Z992 Dependence on renal dialysis: Secondary | ICD-10-CM | POA: Diagnosis not present

## 2021-07-22 DIAGNOSIS — N2581 Secondary hyperparathyroidism of renal origin: Secondary | ICD-10-CM | POA: Diagnosis not present

## 2021-07-23 DIAGNOSIS — I120 Hypertensive chronic kidney disease with stage 5 chronic kidney disease or end stage renal disease: Secondary | ICD-10-CM | POA: Diagnosis not present

## 2021-07-23 DIAGNOSIS — N186 End stage renal disease: Secondary | ICD-10-CM | POA: Diagnosis not present

## 2021-07-23 DIAGNOSIS — Z992 Dependence on renal dialysis: Secondary | ICD-10-CM | POA: Diagnosis not present

## 2021-07-25 DIAGNOSIS — N186 End stage renal disease: Secondary | ICD-10-CM | POA: Diagnosis not present

## 2021-07-25 DIAGNOSIS — N2581 Secondary hyperparathyroidism of renal origin: Secondary | ICD-10-CM | POA: Diagnosis not present

## 2021-07-25 DIAGNOSIS — Z992 Dependence on renal dialysis: Secondary | ICD-10-CM | POA: Diagnosis not present

## 2021-07-26 DIAGNOSIS — R0602 Shortness of breath: Secondary | ICD-10-CM | POA: Diagnosis not present

## 2021-07-26 DIAGNOSIS — I5022 Chronic systolic (congestive) heart failure: Secondary | ICD-10-CM | POA: Diagnosis not present

## 2021-07-26 DIAGNOSIS — I480 Paroxysmal atrial fibrillation: Secondary | ICD-10-CM | POA: Diagnosis not present

## 2021-07-26 DIAGNOSIS — E7849 Other hyperlipidemia: Secondary | ICD-10-CM | POA: Diagnosis not present

## 2021-07-27 DIAGNOSIS — N186 End stage renal disease: Secondary | ICD-10-CM | POA: Diagnosis not present

## 2021-07-27 DIAGNOSIS — N2581 Secondary hyperparathyroidism of renal origin: Secondary | ICD-10-CM | POA: Diagnosis not present

## 2021-07-27 DIAGNOSIS — Z992 Dependence on renal dialysis: Secondary | ICD-10-CM | POA: Diagnosis not present

## 2021-07-29 DIAGNOSIS — Z992 Dependence on renal dialysis: Secondary | ICD-10-CM | POA: Diagnosis not present

## 2021-07-29 DIAGNOSIS — N186 End stage renal disease: Secondary | ICD-10-CM | POA: Diagnosis not present

## 2021-07-29 DIAGNOSIS — N2581 Secondary hyperparathyroidism of renal origin: Secondary | ICD-10-CM | POA: Diagnosis not present

## 2021-08-01 DIAGNOSIS — Z992 Dependence on renal dialysis: Secondary | ICD-10-CM | POA: Diagnosis not present

## 2021-08-01 DIAGNOSIS — N186 End stage renal disease: Secondary | ICD-10-CM | POA: Diagnosis not present

## 2021-08-01 DIAGNOSIS — N2581 Secondary hyperparathyroidism of renal origin: Secondary | ICD-10-CM | POA: Diagnosis not present

## 2021-08-03 DIAGNOSIS — N2581 Secondary hyperparathyroidism of renal origin: Secondary | ICD-10-CM | POA: Diagnosis not present

## 2021-08-03 DIAGNOSIS — Z992 Dependence on renal dialysis: Secondary | ICD-10-CM | POA: Diagnosis not present

## 2021-08-03 DIAGNOSIS — N186 End stage renal disease: Secondary | ICD-10-CM | POA: Diagnosis not present

## 2021-08-05 DIAGNOSIS — N2581 Secondary hyperparathyroidism of renal origin: Secondary | ICD-10-CM | POA: Diagnosis not present

## 2021-08-05 DIAGNOSIS — N186 End stage renal disease: Secondary | ICD-10-CM | POA: Diagnosis not present

## 2021-08-05 DIAGNOSIS — Z992 Dependence on renal dialysis: Secondary | ICD-10-CM | POA: Diagnosis not present

## 2021-08-08 DIAGNOSIS — N2581 Secondary hyperparathyroidism of renal origin: Secondary | ICD-10-CM | POA: Diagnosis not present

## 2021-08-08 DIAGNOSIS — N186 End stage renal disease: Secondary | ICD-10-CM | POA: Diagnosis not present

## 2021-08-08 DIAGNOSIS — Z992 Dependence on renal dialysis: Secondary | ICD-10-CM | POA: Diagnosis not present

## 2021-08-10 DIAGNOSIS — Z992 Dependence on renal dialysis: Secondary | ICD-10-CM | POA: Diagnosis not present

## 2021-08-10 DIAGNOSIS — N2581 Secondary hyperparathyroidism of renal origin: Secondary | ICD-10-CM | POA: Diagnosis not present

## 2021-08-10 DIAGNOSIS — N186 End stage renal disease: Secondary | ICD-10-CM | POA: Diagnosis not present

## 2021-08-12 DIAGNOSIS — Z992 Dependence on renal dialysis: Secondary | ICD-10-CM | POA: Diagnosis not present

## 2021-08-12 DIAGNOSIS — N2581 Secondary hyperparathyroidism of renal origin: Secondary | ICD-10-CM | POA: Diagnosis not present

## 2021-08-12 DIAGNOSIS — N186 End stage renal disease: Secondary | ICD-10-CM | POA: Diagnosis not present

## 2021-08-15 DIAGNOSIS — N186 End stage renal disease: Secondary | ICD-10-CM | POA: Diagnosis not present

## 2021-08-15 DIAGNOSIS — Z992 Dependence on renal dialysis: Secondary | ICD-10-CM | POA: Diagnosis not present

## 2021-08-15 DIAGNOSIS — N2581 Secondary hyperparathyroidism of renal origin: Secondary | ICD-10-CM | POA: Diagnosis not present

## 2021-08-17 DIAGNOSIS — N186 End stage renal disease: Secondary | ICD-10-CM | POA: Diagnosis not present

## 2021-08-17 DIAGNOSIS — Z992 Dependence on renal dialysis: Secondary | ICD-10-CM | POA: Diagnosis not present

## 2021-08-17 DIAGNOSIS — N2581 Secondary hyperparathyroidism of renal origin: Secondary | ICD-10-CM | POA: Diagnosis not present

## 2021-08-19 DIAGNOSIS — Z992 Dependence on renal dialysis: Secondary | ICD-10-CM | POA: Diagnosis not present

## 2021-08-19 DIAGNOSIS — N186 End stage renal disease: Secondary | ICD-10-CM | POA: Diagnosis not present

## 2021-08-19 DIAGNOSIS — N2581 Secondary hyperparathyroidism of renal origin: Secondary | ICD-10-CM | POA: Diagnosis not present

## 2021-08-22 DIAGNOSIS — N186 End stage renal disease: Secondary | ICD-10-CM | POA: Diagnosis not present

## 2021-08-22 DIAGNOSIS — N2581 Secondary hyperparathyroidism of renal origin: Secondary | ICD-10-CM | POA: Diagnosis not present

## 2021-08-22 DIAGNOSIS — Z992 Dependence on renal dialysis: Secondary | ICD-10-CM | POA: Diagnosis not present

## 2021-08-23 DIAGNOSIS — N186 End stage renal disease: Secondary | ICD-10-CM | POA: Diagnosis not present

## 2021-08-23 DIAGNOSIS — Z992 Dependence on renal dialysis: Secondary | ICD-10-CM | POA: Diagnosis not present

## 2021-08-23 DIAGNOSIS — I120 Hypertensive chronic kidney disease with stage 5 chronic kidney disease or end stage renal disease: Secondary | ICD-10-CM | POA: Diagnosis not present

## 2021-08-24 DIAGNOSIS — N186 End stage renal disease: Secondary | ICD-10-CM | POA: Diagnosis not present

## 2021-08-24 DIAGNOSIS — Z992 Dependence on renal dialysis: Secondary | ICD-10-CM | POA: Diagnosis not present

## 2021-08-24 DIAGNOSIS — N2581 Secondary hyperparathyroidism of renal origin: Secondary | ICD-10-CM | POA: Diagnosis not present

## 2021-08-26 DIAGNOSIS — N2581 Secondary hyperparathyroidism of renal origin: Secondary | ICD-10-CM | POA: Diagnosis not present

## 2021-08-26 DIAGNOSIS — N186 End stage renal disease: Secondary | ICD-10-CM | POA: Diagnosis not present

## 2021-08-26 DIAGNOSIS — Z992 Dependence on renal dialysis: Secondary | ICD-10-CM | POA: Diagnosis not present

## 2021-08-29 DIAGNOSIS — N2581 Secondary hyperparathyroidism of renal origin: Secondary | ICD-10-CM | POA: Diagnosis not present

## 2021-08-29 DIAGNOSIS — Z992 Dependence on renal dialysis: Secondary | ICD-10-CM | POA: Diagnosis not present

## 2021-08-29 DIAGNOSIS — N186 End stage renal disease: Secondary | ICD-10-CM | POA: Diagnosis not present

## 2021-08-31 DIAGNOSIS — N2581 Secondary hyperparathyroidism of renal origin: Secondary | ICD-10-CM | POA: Diagnosis not present

## 2021-08-31 DIAGNOSIS — Z992 Dependence on renal dialysis: Secondary | ICD-10-CM | POA: Diagnosis not present

## 2021-08-31 DIAGNOSIS — N186 End stage renal disease: Secondary | ICD-10-CM | POA: Diagnosis not present

## 2021-09-02 DIAGNOSIS — N186 End stage renal disease: Secondary | ICD-10-CM | POA: Diagnosis not present

## 2021-09-02 DIAGNOSIS — Z992 Dependence on renal dialysis: Secondary | ICD-10-CM | POA: Diagnosis not present

## 2021-09-02 DIAGNOSIS — N2581 Secondary hyperparathyroidism of renal origin: Secondary | ICD-10-CM | POA: Diagnosis not present

## 2021-09-05 DIAGNOSIS — Z992 Dependence on renal dialysis: Secondary | ICD-10-CM | POA: Diagnosis not present

## 2021-09-05 DIAGNOSIS — N186 End stage renal disease: Secondary | ICD-10-CM | POA: Diagnosis not present

## 2021-09-05 DIAGNOSIS — N2581 Secondary hyperparathyroidism of renal origin: Secondary | ICD-10-CM | POA: Diagnosis not present

## 2021-09-07 DIAGNOSIS — N186 End stage renal disease: Secondary | ICD-10-CM | POA: Diagnosis not present

## 2021-09-07 DIAGNOSIS — N2581 Secondary hyperparathyroidism of renal origin: Secondary | ICD-10-CM | POA: Diagnosis not present

## 2021-09-07 DIAGNOSIS — Z992 Dependence on renal dialysis: Secondary | ICD-10-CM | POA: Diagnosis not present

## 2021-09-09 DIAGNOSIS — N186 End stage renal disease: Secondary | ICD-10-CM | POA: Diagnosis not present

## 2021-09-09 DIAGNOSIS — N2581 Secondary hyperparathyroidism of renal origin: Secondary | ICD-10-CM | POA: Diagnosis not present

## 2021-09-09 DIAGNOSIS — Z992 Dependence on renal dialysis: Secondary | ICD-10-CM | POA: Diagnosis not present

## 2021-09-12 DIAGNOSIS — N186 End stage renal disease: Secondary | ICD-10-CM | POA: Diagnosis not present

## 2021-09-12 DIAGNOSIS — N2581 Secondary hyperparathyroidism of renal origin: Secondary | ICD-10-CM | POA: Diagnosis not present

## 2021-09-12 DIAGNOSIS — Z992 Dependence on renal dialysis: Secondary | ICD-10-CM | POA: Diagnosis not present

## 2021-09-14 DIAGNOSIS — N186 End stage renal disease: Secondary | ICD-10-CM | POA: Diagnosis not present

## 2021-09-14 DIAGNOSIS — N2581 Secondary hyperparathyroidism of renal origin: Secondary | ICD-10-CM | POA: Diagnosis not present

## 2021-09-14 DIAGNOSIS — Z992 Dependence on renal dialysis: Secondary | ICD-10-CM | POA: Diagnosis not present

## 2021-09-16 DIAGNOSIS — N186 End stage renal disease: Secondary | ICD-10-CM | POA: Diagnosis not present

## 2021-09-16 DIAGNOSIS — Z992 Dependence on renal dialysis: Secondary | ICD-10-CM | POA: Diagnosis not present

## 2021-09-16 DIAGNOSIS — N2581 Secondary hyperparathyroidism of renal origin: Secondary | ICD-10-CM | POA: Diagnosis not present

## 2021-09-19 DIAGNOSIS — N186 End stage renal disease: Secondary | ICD-10-CM | POA: Diagnosis not present

## 2021-09-19 DIAGNOSIS — Z992 Dependence on renal dialysis: Secondary | ICD-10-CM | POA: Diagnosis not present

## 2021-09-19 DIAGNOSIS — N2581 Secondary hyperparathyroidism of renal origin: Secondary | ICD-10-CM | POA: Diagnosis not present

## 2021-09-21 DIAGNOSIS — N186 End stage renal disease: Secondary | ICD-10-CM | POA: Diagnosis not present

## 2021-09-21 DIAGNOSIS — N2581 Secondary hyperparathyroidism of renal origin: Secondary | ICD-10-CM | POA: Diagnosis not present

## 2021-09-21 DIAGNOSIS — Z992 Dependence on renal dialysis: Secondary | ICD-10-CM | POA: Diagnosis not present

## 2021-09-22 DIAGNOSIS — Z992 Dependence on renal dialysis: Secondary | ICD-10-CM | POA: Diagnosis not present

## 2021-09-22 DIAGNOSIS — N186 End stage renal disease: Secondary | ICD-10-CM | POA: Diagnosis not present

## 2021-09-22 DIAGNOSIS — I120 Hypertensive chronic kidney disease with stage 5 chronic kidney disease or end stage renal disease: Secondary | ICD-10-CM | POA: Diagnosis not present

## 2021-09-23 DIAGNOSIS — Z992 Dependence on renal dialysis: Secondary | ICD-10-CM | POA: Diagnosis not present

## 2021-09-23 DIAGNOSIS — N2581 Secondary hyperparathyroidism of renal origin: Secondary | ICD-10-CM | POA: Diagnosis not present

## 2021-09-23 DIAGNOSIS — N186 End stage renal disease: Secondary | ICD-10-CM | POA: Diagnosis not present

## 2021-09-26 DIAGNOSIS — N186 End stage renal disease: Secondary | ICD-10-CM | POA: Diagnosis not present

## 2021-09-26 DIAGNOSIS — Z992 Dependence on renal dialysis: Secondary | ICD-10-CM | POA: Diagnosis not present

## 2021-09-26 DIAGNOSIS — N2581 Secondary hyperparathyroidism of renal origin: Secondary | ICD-10-CM | POA: Diagnosis not present

## 2021-09-27 ENCOUNTER — Telehealth: Payer: Self-pay | Admitting: Family Medicine

## 2021-09-27 NOTE — Telephone Encounter (Signed)
Checked FDA drug shortage database and divalproex not showing up. Called [facility] at [phone]. Spoke w/ tech. No issues with medication supply. There is no shortage.  They are showing they were trying to fill rx early. Last received med Mid-April for #90 (90days).  They should be able to fill now.   Called wife back at [phone]. Relayed info above. She put pt on phone. He states [facility] app saying no refills remain. Advised I spoke with pharmacy and they have active rx on file with refills. Sent years supply back in Oct. He verbalized understanding and will f/u with pharmacy to process refill.

## 2021-09-27 NOTE — Telephone Encounter (Signed)
Pt's wife states pt is in need of a refill but has been told there is a Event organiser, pt is asking for a call to discuss the shortage of divalproex (DEPAKOTE ER) 250 MG 24 hr tablet, and how that affects him.

## 2021-09-28 DIAGNOSIS — N2581 Secondary hyperparathyroidism of renal origin: Secondary | ICD-10-CM | POA: Diagnosis not present

## 2021-09-28 DIAGNOSIS — Z992 Dependence on renal dialysis: Secondary | ICD-10-CM | POA: Diagnosis not present

## 2021-09-28 DIAGNOSIS — N186 End stage renal disease: Secondary | ICD-10-CM | POA: Diagnosis not present

## 2021-09-30 DIAGNOSIS — N186 End stage renal disease: Secondary | ICD-10-CM | POA: Diagnosis not present

## 2021-09-30 DIAGNOSIS — N2581 Secondary hyperparathyroidism of renal origin: Secondary | ICD-10-CM | POA: Diagnosis not present

## 2021-09-30 DIAGNOSIS — Z992 Dependence on renal dialysis: Secondary | ICD-10-CM | POA: Diagnosis not present

## 2021-10-03 DIAGNOSIS — N2581 Secondary hyperparathyroidism of renal origin: Secondary | ICD-10-CM | POA: Diagnosis not present

## 2021-10-03 DIAGNOSIS — Z992 Dependence on renal dialysis: Secondary | ICD-10-CM | POA: Diagnosis not present

## 2021-10-03 DIAGNOSIS — N186 End stage renal disease: Secondary | ICD-10-CM | POA: Diagnosis not present

## 2021-10-05 DIAGNOSIS — Z992 Dependence on renal dialysis: Secondary | ICD-10-CM | POA: Diagnosis not present

## 2021-10-05 DIAGNOSIS — N186 End stage renal disease: Secondary | ICD-10-CM | POA: Diagnosis not present

## 2021-10-05 DIAGNOSIS — N2581 Secondary hyperparathyroidism of renal origin: Secondary | ICD-10-CM | POA: Diagnosis not present

## 2021-10-07 DIAGNOSIS — Z992 Dependence on renal dialysis: Secondary | ICD-10-CM | POA: Diagnosis not present

## 2021-10-07 DIAGNOSIS — N2581 Secondary hyperparathyroidism of renal origin: Secondary | ICD-10-CM | POA: Diagnosis not present

## 2021-10-07 DIAGNOSIS — N186 End stage renal disease: Secondary | ICD-10-CM | POA: Diagnosis not present

## 2021-10-10 DIAGNOSIS — Z992 Dependence on renal dialysis: Secondary | ICD-10-CM | POA: Diagnosis not present

## 2021-10-10 DIAGNOSIS — N2581 Secondary hyperparathyroidism of renal origin: Secondary | ICD-10-CM | POA: Diagnosis not present

## 2021-10-10 DIAGNOSIS — N186 End stage renal disease: Secondary | ICD-10-CM | POA: Diagnosis not present

## 2021-10-12 DIAGNOSIS — N186 End stage renal disease: Secondary | ICD-10-CM | POA: Diagnosis not present

## 2021-10-12 DIAGNOSIS — N2581 Secondary hyperparathyroidism of renal origin: Secondary | ICD-10-CM | POA: Diagnosis not present

## 2021-10-12 DIAGNOSIS — Z992 Dependence on renal dialysis: Secondary | ICD-10-CM | POA: Diagnosis not present

## 2021-10-14 DIAGNOSIS — Z992 Dependence on renal dialysis: Secondary | ICD-10-CM | POA: Diagnosis not present

## 2021-10-14 DIAGNOSIS — N2581 Secondary hyperparathyroidism of renal origin: Secondary | ICD-10-CM | POA: Diagnosis not present

## 2021-10-14 DIAGNOSIS — N186 End stage renal disease: Secondary | ICD-10-CM | POA: Diagnosis not present

## 2021-10-17 DIAGNOSIS — Z992 Dependence on renal dialysis: Secondary | ICD-10-CM | POA: Diagnosis not present

## 2021-10-17 DIAGNOSIS — N186 End stage renal disease: Secondary | ICD-10-CM | POA: Diagnosis not present

## 2021-10-17 DIAGNOSIS — N2581 Secondary hyperparathyroidism of renal origin: Secondary | ICD-10-CM | POA: Diagnosis not present

## 2021-10-19 DIAGNOSIS — N2581 Secondary hyperparathyroidism of renal origin: Secondary | ICD-10-CM | POA: Diagnosis not present

## 2021-10-19 DIAGNOSIS — Z992 Dependence on renal dialysis: Secondary | ICD-10-CM | POA: Diagnosis not present

## 2021-10-19 DIAGNOSIS — N186 End stage renal disease: Secondary | ICD-10-CM | POA: Diagnosis not present

## 2021-10-21 DIAGNOSIS — N2581 Secondary hyperparathyroidism of renal origin: Secondary | ICD-10-CM | POA: Diagnosis not present

## 2021-10-21 DIAGNOSIS — N186 End stage renal disease: Secondary | ICD-10-CM | POA: Diagnosis not present

## 2021-10-21 DIAGNOSIS — Z992 Dependence on renal dialysis: Secondary | ICD-10-CM | POA: Diagnosis not present

## 2021-10-23 DIAGNOSIS — Z992 Dependence on renal dialysis: Secondary | ICD-10-CM | POA: Diagnosis not present

## 2021-10-23 DIAGNOSIS — I120 Hypertensive chronic kidney disease with stage 5 chronic kidney disease or end stage renal disease: Secondary | ICD-10-CM | POA: Diagnosis not present

## 2021-10-23 DIAGNOSIS — N186 End stage renal disease: Secondary | ICD-10-CM | POA: Diagnosis not present

## 2021-10-24 DIAGNOSIS — N186 End stage renal disease: Secondary | ICD-10-CM | POA: Diagnosis not present

## 2021-10-24 DIAGNOSIS — Z992 Dependence on renal dialysis: Secondary | ICD-10-CM | POA: Diagnosis not present

## 2021-10-24 DIAGNOSIS — N2581 Secondary hyperparathyroidism of renal origin: Secondary | ICD-10-CM | POA: Diagnosis not present

## 2021-10-26 DIAGNOSIS — Z992 Dependence on renal dialysis: Secondary | ICD-10-CM | POA: Diagnosis not present

## 2021-10-26 DIAGNOSIS — N2581 Secondary hyperparathyroidism of renal origin: Secondary | ICD-10-CM | POA: Diagnosis not present

## 2021-10-26 DIAGNOSIS — N186 End stage renal disease: Secondary | ICD-10-CM | POA: Diagnosis not present

## 2021-10-28 DIAGNOSIS — N186 End stage renal disease: Secondary | ICD-10-CM | POA: Diagnosis not present

## 2021-10-28 DIAGNOSIS — N2581 Secondary hyperparathyroidism of renal origin: Secondary | ICD-10-CM | POA: Diagnosis not present

## 2021-10-28 DIAGNOSIS — Z992 Dependence on renal dialysis: Secondary | ICD-10-CM | POA: Diagnosis not present

## 2021-10-31 DIAGNOSIS — Z992 Dependence on renal dialysis: Secondary | ICD-10-CM | POA: Diagnosis not present

## 2021-10-31 DIAGNOSIS — N2581 Secondary hyperparathyroidism of renal origin: Secondary | ICD-10-CM | POA: Diagnosis not present

## 2021-10-31 DIAGNOSIS — N186 End stage renal disease: Secondary | ICD-10-CM | POA: Diagnosis not present

## 2021-11-01 DIAGNOSIS — E7849 Other hyperlipidemia: Secondary | ICD-10-CM | POA: Diagnosis not present

## 2021-11-01 DIAGNOSIS — I480 Paroxysmal atrial fibrillation: Secondary | ICD-10-CM | POA: Diagnosis not present

## 2021-11-01 DIAGNOSIS — I5022 Chronic systolic (congestive) heart failure: Secondary | ICD-10-CM | POA: Diagnosis not present

## 2021-11-01 DIAGNOSIS — R0602 Shortness of breath: Secondary | ICD-10-CM | POA: Diagnosis not present

## 2021-11-02 DIAGNOSIS — N186 End stage renal disease: Secondary | ICD-10-CM | POA: Diagnosis not present

## 2021-11-02 DIAGNOSIS — Z992 Dependence on renal dialysis: Secondary | ICD-10-CM | POA: Diagnosis not present

## 2021-11-02 DIAGNOSIS — N2581 Secondary hyperparathyroidism of renal origin: Secondary | ICD-10-CM | POA: Diagnosis not present

## 2021-11-04 DIAGNOSIS — Z992 Dependence on renal dialysis: Secondary | ICD-10-CM | POA: Diagnosis not present

## 2021-11-04 DIAGNOSIS — N2581 Secondary hyperparathyroidism of renal origin: Secondary | ICD-10-CM | POA: Diagnosis not present

## 2021-11-04 DIAGNOSIS — N186 End stage renal disease: Secondary | ICD-10-CM | POA: Diagnosis not present

## 2021-11-07 DIAGNOSIS — N186 End stage renal disease: Secondary | ICD-10-CM | POA: Diagnosis not present

## 2021-11-07 DIAGNOSIS — Z992 Dependence on renal dialysis: Secondary | ICD-10-CM | POA: Diagnosis not present

## 2021-11-07 DIAGNOSIS — N2581 Secondary hyperparathyroidism of renal origin: Secondary | ICD-10-CM | POA: Diagnosis not present

## 2021-11-09 DIAGNOSIS — N2581 Secondary hyperparathyroidism of renal origin: Secondary | ICD-10-CM | POA: Diagnosis not present

## 2021-11-09 DIAGNOSIS — Z992 Dependence on renal dialysis: Secondary | ICD-10-CM | POA: Diagnosis not present

## 2021-11-09 DIAGNOSIS — N186 End stage renal disease: Secondary | ICD-10-CM | POA: Diagnosis not present

## 2021-11-11 DIAGNOSIS — N186 End stage renal disease: Secondary | ICD-10-CM | POA: Diagnosis not present

## 2021-11-11 DIAGNOSIS — Z992 Dependence on renal dialysis: Secondary | ICD-10-CM | POA: Diagnosis not present

## 2021-11-11 DIAGNOSIS — N2581 Secondary hyperparathyroidism of renal origin: Secondary | ICD-10-CM | POA: Diagnosis not present

## 2021-11-14 DIAGNOSIS — Z992 Dependence on renal dialysis: Secondary | ICD-10-CM | POA: Diagnosis not present

## 2021-11-14 DIAGNOSIS — N2581 Secondary hyperparathyroidism of renal origin: Secondary | ICD-10-CM | POA: Diagnosis not present

## 2021-11-14 DIAGNOSIS — N186 End stage renal disease: Secondary | ICD-10-CM | POA: Diagnosis not present

## 2021-11-16 DIAGNOSIS — N2581 Secondary hyperparathyroidism of renal origin: Secondary | ICD-10-CM | POA: Diagnosis not present

## 2021-11-16 DIAGNOSIS — Z992 Dependence on renal dialysis: Secondary | ICD-10-CM | POA: Diagnosis not present

## 2021-11-16 DIAGNOSIS — N186 End stage renal disease: Secondary | ICD-10-CM | POA: Diagnosis not present

## 2021-11-18 DIAGNOSIS — N186 End stage renal disease: Secondary | ICD-10-CM | POA: Diagnosis not present

## 2021-11-18 DIAGNOSIS — N2581 Secondary hyperparathyroidism of renal origin: Secondary | ICD-10-CM | POA: Diagnosis not present

## 2021-11-18 DIAGNOSIS — Z992 Dependence on renal dialysis: Secondary | ICD-10-CM | POA: Diagnosis not present

## 2021-11-21 DIAGNOSIS — Z992 Dependence on renal dialysis: Secondary | ICD-10-CM | POA: Diagnosis not present

## 2021-11-21 DIAGNOSIS — N186 End stage renal disease: Secondary | ICD-10-CM | POA: Diagnosis not present

## 2021-11-21 DIAGNOSIS — N2581 Secondary hyperparathyroidism of renal origin: Secondary | ICD-10-CM | POA: Diagnosis not present

## 2021-11-23 DIAGNOSIS — Z992 Dependence on renal dialysis: Secondary | ICD-10-CM | POA: Diagnosis not present

## 2021-11-23 DIAGNOSIS — N186 End stage renal disease: Secondary | ICD-10-CM | POA: Diagnosis not present

## 2021-11-23 DIAGNOSIS — N2581 Secondary hyperparathyroidism of renal origin: Secondary | ICD-10-CM | POA: Diagnosis not present

## 2021-11-23 DIAGNOSIS — I120 Hypertensive chronic kidney disease with stage 5 chronic kidney disease or end stage renal disease: Secondary | ICD-10-CM | POA: Diagnosis not present

## 2021-11-25 DIAGNOSIS — N186 End stage renal disease: Secondary | ICD-10-CM | POA: Diagnosis not present

## 2021-11-25 DIAGNOSIS — Z992 Dependence on renal dialysis: Secondary | ICD-10-CM | POA: Diagnosis not present

## 2021-11-25 DIAGNOSIS — N2581 Secondary hyperparathyroidism of renal origin: Secondary | ICD-10-CM | POA: Diagnosis not present

## 2021-11-28 DIAGNOSIS — Z992 Dependence on renal dialysis: Secondary | ICD-10-CM | POA: Diagnosis not present

## 2021-11-28 DIAGNOSIS — N186 End stage renal disease: Secondary | ICD-10-CM | POA: Diagnosis not present

## 2021-11-28 DIAGNOSIS — N2581 Secondary hyperparathyroidism of renal origin: Secondary | ICD-10-CM | POA: Diagnosis not present

## 2021-11-30 DIAGNOSIS — N186 End stage renal disease: Secondary | ICD-10-CM | POA: Diagnosis not present

## 2021-11-30 DIAGNOSIS — Z992 Dependence on renal dialysis: Secondary | ICD-10-CM | POA: Diagnosis not present

## 2021-11-30 DIAGNOSIS — N2581 Secondary hyperparathyroidism of renal origin: Secondary | ICD-10-CM | POA: Diagnosis not present

## 2021-12-02 DIAGNOSIS — N2581 Secondary hyperparathyroidism of renal origin: Secondary | ICD-10-CM | POA: Diagnosis not present

## 2021-12-02 DIAGNOSIS — Z992 Dependence on renal dialysis: Secondary | ICD-10-CM | POA: Diagnosis not present

## 2021-12-02 DIAGNOSIS — N186 End stage renal disease: Secondary | ICD-10-CM | POA: Diagnosis not present

## 2021-12-05 DIAGNOSIS — N186 End stage renal disease: Secondary | ICD-10-CM | POA: Diagnosis not present

## 2021-12-05 DIAGNOSIS — Z992 Dependence on renal dialysis: Secondary | ICD-10-CM | POA: Diagnosis not present

## 2021-12-05 DIAGNOSIS — N2581 Secondary hyperparathyroidism of renal origin: Secondary | ICD-10-CM | POA: Diagnosis not present

## 2021-12-07 ENCOUNTER — Telehealth: Payer: Self-pay | Admitting: Family Medicine

## 2021-12-07 ENCOUNTER — Other Ambulatory Visit: Payer: Self-pay

## 2021-12-07 DIAGNOSIS — N2581 Secondary hyperparathyroidism of renal origin: Secondary | ICD-10-CM | POA: Diagnosis not present

## 2021-12-07 DIAGNOSIS — Z992 Dependence on renal dialysis: Secondary | ICD-10-CM | POA: Diagnosis not present

## 2021-12-07 DIAGNOSIS — N186 End stage renal disease: Secondary | ICD-10-CM | POA: Diagnosis not present

## 2021-12-07 MED ORDER — LEVETIRACETAM 500 MG PO TABS: 500.0000 mg | ORAL_TABLET | Freq: Two times a day (BID) | ORAL | 0 refills | Status: DC

## 2021-12-07 NOTE — Telephone Encounter (Signed)
Refill has been sent for the patient 

## 2021-12-07 NOTE — Telephone Encounter (Signed)
Pt is requesting a refill for levETIRAcetam (KEPPRA) 500 MG tablet  .  Pharmacy:  [id number]

## 2021-12-08 ENCOUNTER — Telehealth: Payer: Self-pay | Admitting: Family Medicine

## 2021-12-08 NOTE — Telephone Encounter (Signed)
Pt's wife, Tevis Dunavan request refill for divalproex (DEPAKOTE ER) 250 MG 24 hr tablet and lamoTRIgine (LAMICTAL) 25 MG tablet at [id number]

## 2021-12-09 DIAGNOSIS — N186 End stage renal disease: Secondary | ICD-10-CM | POA: Diagnosis not present

## 2021-12-09 DIAGNOSIS — Z992 Dependence on renal dialysis: Secondary | ICD-10-CM | POA: Diagnosis not present

## 2021-12-09 DIAGNOSIS — N2581 Secondary hyperparathyroidism of renal origin: Secondary | ICD-10-CM | POA: Diagnosis not present

## 2021-12-10 NOTE — Telephone Encounter (Signed)
Reviewed pt chart. Rx depakote last sent to this pharmacy 01/04/21 #90 with 3 refills (1 yr supply). Rx lamictal last sent 01/04/22 #180, 3 refills (1 yr supply). Took soon to send refills for both.

## 2021-12-12 ENCOUNTER — Other Ambulatory Visit: Payer: Self-pay

## 2021-12-12 DIAGNOSIS — N2581 Secondary hyperparathyroidism of renal origin: Secondary | ICD-10-CM | POA: Diagnosis not present

## 2021-12-12 DIAGNOSIS — N186 End stage renal disease: Secondary | ICD-10-CM | POA: Diagnosis not present

## 2021-12-12 DIAGNOSIS — Z992 Dependence on renal dialysis: Secondary | ICD-10-CM | POA: Diagnosis not present

## 2021-12-12 MED ORDER — DIVALPROEX SODIUM ER 250 MG PO TB24: 250.0000 mg | ORAL_TABLET | Freq: Every day | ORAL | 1 refills | Status: DC

## 2021-12-14 DIAGNOSIS — N2581 Secondary hyperparathyroidism of renal origin: Secondary | ICD-10-CM | POA: Diagnosis not present

## 2021-12-14 DIAGNOSIS — Z992 Dependence on renal dialysis: Secondary | ICD-10-CM | POA: Diagnosis not present

## 2021-12-14 DIAGNOSIS — N186 End stage renal disease: Secondary | ICD-10-CM | POA: Diagnosis not present

## 2021-12-16 DIAGNOSIS — Z992 Dependence on renal dialysis: Secondary | ICD-10-CM | POA: Diagnosis not present

## 2021-12-16 DIAGNOSIS — N186 End stage renal disease: Secondary | ICD-10-CM | POA: Diagnosis not present

## 2021-12-16 DIAGNOSIS — N2581 Secondary hyperparathyroidism of renal origin: Secondary | ICD-10-CM | POA: Diagnosis not present

## 2021-12-19 DIAGNOSIS — N186 End stage renal disease: Secondary | ICD-10-CM | POA: Diagnosis not present

## 2021-12-19 DIAGNOSIS — N2581 Secondary hyperparathyroidism of renal origin: Secondary | ICD-10-CM | POA: Diagnosis not present

## 2021-12-19 DIAGNOSIS — Z992 Dependence on renal dialysis: Secondary | ICD-10-CM | POA: Diagnosis not present

## 2021-12-21 DIAGNOSIS — N2581 Secondary hyperparathyroidism of renal origin: Secondary | ICD-10-CM | POA: Diagnosis not present

## 2021-12-21 DIAGNOSIS — N186 End stage renal disease: Secondary | ICD-10-CM | POA: Diagnosis not present

## 2021-12-21 DIAGNOSIS — Z992 Dependence on renal dialysis: Secondary | ICD-10-CM | POA: Diagnosis not present

## 2021-12-23 DIAGNOSIS — I120 Hypertensive chronic kidney disease with stage 5 chronic kidney disease or end stage renal disease: Secondary | ICD-10-CM | POA: Diagnosis not present

## 2021-12-23 DIAGNOSIS — N2581 Secondary hyperparathyroidism of renal origin: Secondary | ICD-10-CM | POA: Diagnosis not present

## 2021-12-23 DIAGNOSIS — Z992 Dependence on renal dialysis: Secondary | ICD-10-CM | POA: Diagnosis not present

## 2021-12-23 DIAGNOSIS — N186 End stage renal disease: Secondary | ICD-10-CM | POA: Diagnosis not present

## 2021-12-26 DIAGNOSIS — N2581 Secondary hyperparathyroidism of renal origin: Secondary | ICD-10-CM | POA: Diagnosis not present

## 2021-12-26 DIAGNOSIS — Z992 Dependence on renal dialysis: Secondary | ICD-10-CM | POA: Diagnosis not present

## 2021-12-26 DIAGNOSIS — N186 End stage renal disease: Secondary | ICD-10-CM | POA: Diagnosis not present

## 2021-12-28 DIAGNOSIS — N186 End stage renal disease: Secondary | ICD-10-CM | POA: Diagnosis not present

## 2021-12-28 DIAGNOSIS — N2581 Secondary hyperparathyroidism of renal origin: Secondary | ICD-10-CM | POA: Diagnosis not present

## 2021-12-28 DIAGNOSIS — Z992 Dependence on renal dialysis: Secondary | ICD-10-CM | POA: Diagnosis not present

## 2021-12-30 DIAGNOSIS — N2581 Secondary hyperparathyroidism of renal origin: Secondary | ICD-10-CM | POA: Diagnosis not present

## 2021-12-30 DIAGNOSIS — Z992 Dependence on renal dialysis: Secondary | ICD-10-CM | POA: Diagnosis not present

## 2021-12-30 DIAGNOSIS — N186 End stage renal disease: Secondary | ICD-10-CM | POA: Diagnosis not present

## 2022-01-02 DIAGNOSIS — N186 End stage renal disease: Secondary | ICD-10-CM | POA: Diagnosis not present

## 2022-01-02 DIAGNOSIS — N2581 Secondary hyperparathyroidism of renal origin: Secondary | ICD-10-CM | POA: Diagnosis not present

## 2022-01-02 DIAGNOSIS — Z992 Dependence on renal dialysis: Secondary | ICD-10-CM | POA: Diagnosis not present

## 2022-01-03 NOTE — Patient Instructions (Signed)
Below is our plan:  We will continue current treatment plan  Please make sure you are consistent with timing of seizure medication. I recommend annual visit with primary care provider (PCP) for complete physical and routine blood work. I recommend daily intake of vitamin D (400-800iu) and calcium (800-1000mg) for bone health. Discuss Dexa screening with PCP.   According to [place] law, you can not drive unless you are seizure / syncope free for at least 6 months and under physician's care.  Please maintain precautions. Do not participate in activities where a loss of awareness could harm you or someone else. No swimming alone, no tub bathing, no hot tubs, no driving, no operating motorized vehicles (cars, ATVs, motocycles, etc), lawnmowers, power tools or firearms. No standing at heights, such as rooftops, ladders or stairs. Avoid hot objects such as stoves, heaters, open fires. Wear a helmet when riding a bicycle, scooter, skateboard, etc. and avoid areas of traffic. Set your water heater to 120 degrees or less.  Please make sure you are staying well hydrated. I recommend 50-60 ounces daily. Well balanced diet and regular exercise encouraged. Consistent sleep schedule with 6-8 hours recommended.   Please continue follow up with care team as directed.   Follow up with me in 1 year   You may receive a survey regarding today's visit. I encourage you to leave honest feed back as I do use this information to improve patient care. Thank you for seeing me today!    

## 2022-01-03 NOTE — Progress Notes (Signed)
PATIENT: Nivin Braniff DOB: 1960/10/19  REASON FOR VISIT: follow up HISTORY FROM: patient  Chief Complaint  Patient presents with   Follow-up    Pt in room #2 pt here today to f/u Depakote, Lamictal and Keppra for seizure.    HISTORY OF PRESENT ILLNESS:  01/08/22 ALL:  Shamari returns for follow up for seizures. He continues levetiracetam 500mg  BID, lamotrigine 25mg  BID and divalproex ER 250mg  QD. He is tolerating meds well and denies recent seizure activity. Last seizure was approx 10 years ago. He continues dialysis. He is feeling well and without concerns, today. He is taking vitamin D daily.   01/04/2021 ALL: Avary returns for follow up for seizures. He continues levetiracetam 500mg  BID, lamotrigine 25mg  BID and divalproex ER 250mg  QD. He is tolerating medications well. No seizure activity since 2014. He continues dialysis (previous failed transplant). He has moved to [place]. He lives with his wife and son. Drives without difficulty.   09/23/2019 ALL:  Dagan Heinz is a 61 y.o. male here today for follow up for seizures. He continues levetiracetam 500mg  BID, lamotrigine 25mg  BID and divalproex ER 250mg  daily. He is doing well and tolerating medications with no adverse effects. Last seizure in 2014. He recently lost his brother and father but feels he is holding up well. He lives with his wife and son. He is driving without difficulty. He continues dialysis on Tu, Th and Sat.   09/22/2018 ALL: Jaheim Canino is a 61 y.o. male here today for follow up for seizure. He continues levetiracetam 500mg  twice daily, lamtrogine 25mg  twice daily and divalproex 250mg  twice daily. He is doing very well without adverse effects. He continues dialysis on Tu, Th and Sa. 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 Dry weight 70.5KG.      History (copied from Dr Gladstone Lighter note on 09/18/2017)   UPDATE (09/18/17, VRP): Since last visit, doing well. Tolerating meds. No  alleviating or aggravating factors. No seizures.   UPDATE 09/17/16: Since last visit, tolerating meds. No seizures. Notes some changes in urination related to his keppra medication.    UPDATE 08/17/15: Since last visit, no seizures. Tolerating medications.    UPDATE 08/16/14: Since last visit, doing well. No seizures. Tolerating meds.    UPDATE 02/10/14: Since last visit, doing well. No tremors. Hair is growing back thicker. No seizures. Tolerating meds.   UPDATE 08/07/13 (LL): Since last visit no further seizures, doing well. No noted tremors.  On depakote ER 250mg  daily, lamotrigine 25mg  BID, and levetiracetam 250mg  BID.  Since Depakote dose has been reduced his hair has returned. iPTH level rose from 126 to 927 since Dec 2014.  No new complaints.    UPDATE 06/05/13 (LL): Since last visit, no further seizures. Tremors are gone. On depakote ER 250mg  BID, lamotrigine 25mg  BID, and levetiracetam 250mg  BID. Exercising daily at the [facility], feels good.    UPDATE 01/05/13 (VP): Since last visit, no further seizures. Kidney transplant failed unfortunately. Tremors are better. On depakote ER 250mg  BID, lamotrigine 25mg  BID, and levetiracetam 250mg  BID.    UPDATE 09/16/12: Patient had kidney transplant on Aug 14, 2012. Hospital admission at [facility] was 10 days. Ureter stent was placed during transplant surgery which had to be replaced and prolonged the admission. Discharged with foley catheter, he could not void in 6 hours so foley was replaced. He currently has foley. Kidney is functioning, no more HD needed. He is on week 3 of 6  weeks of in-home therapy. Ambulates with walker but needed wheelchair for long distances. Has had 9 falls in 12 days. Appears very deconditioned. Tremors are worse since surgery, affecting writing, feeding self. Currently on VPA 1000mg  BID and LTG 25mg  BID     UPDATE 07/28/12: Since last visit, had breakthrough sz after dialysis session (told me that double volume was removed inadvertantly).  Since then I re-adjusted his meds to VPA 1000mg  BID and LTG 50mg  BID. Now in review of records, I think he may still be on 25mg  BID, but he is not sure.    UPDATE 05/28/12: Since last visit, no sz. Tolerating meds. No new events.     UPDATE 02/20/12: Since last visit, now on dialysis. Also, had breakthrough sz on 01/11/12 (admitted x 1 day). Now on incr VPA 750mg  BID and continues on LTG 25mg  BID. No triggering factors for breakthrough sz.     UPDATE 06/26/11: Last convulsive seizure was in February 2012. Most recent abnormal spell was 05/05/2011, following a stress test. He had been laying down at home, felt a abnormal sensation in the left occipital region with a pop sensation, followed by a rush sensation in his face down to his stomach with a sour stomach sensation. He thinks this may have been a petit mall seizure. He was fully aware and conscious during the episode. Episode was similar but somewhat different than his prior episodes. In response to this event he was started on low dose Lamictal 25 mg twice a day. Since that time he has had no further events.     PRIOR HPI: 74 -year-old, right handed, Caucasian male who returns today for followup for history of longstanding seizure disorder. He has a single kidney with renal dysfunction. 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 He has never required dialysis however he is on the transplant list at [facility]. His last major seizure was 05/15/10 and seen in the ER. No loss of B/B, did not bite his tongue. He has had a total of 2 seizures over the last few years. He has had problems with thrombocytopenia in the past . His Depakote dose was reduced and he was seen by a hematologist. The platelet count has returned to normal. He has history of HTN, atrial fib, hypercholesterolemia , and renal failure. Recent labs reviewed from Dr. Deterding's office. Lamictal added at last visit, low dose. No further seizure activity. See ROS.    REVIEW OF SYSTEMS:  Out of a complete 14 system review of symptoms, the patient complains only of the following symptoms, none and all other reviewed systems are negative.  ALLERGIES: Allergies  Allergen Reactions   Diphenhydramine Hcl Palpitations    HOME MEDICATIONS: Outpatient Medications Prior to Visit  Medication Sig Dispense Refill   acetaminophen (TYLENOL) 325 MG tablet Take 2 tablets (650 mg total) by mouth every 6 (six) hours as needed for mild pain (or Fever >/= 101).     allopurinol (ZYLOPRIM) 300 MG tablet Take 300 mg by mouth daily.     amiodarone (PACERONE) 200 MG tablet Take 200 mg by mouth daily.     Ascorbic Acid (VITAMIN C) 1000 MG tablet Take 1,000 mg by mouth daily.     AURYXIA 1 GM 210 MG(Fe) tablet Take 420 mg by mouth in the morning and at bedtime.     b complex-vitamin c-folic acid (NEPHRO-VITE) 0.8 MG TABS tablet Take 0.8 mg by mouth daily.      metoprolol tartrate (LOPRESSOR) 25 MG  tablet Take 25 mg by mouth every evening.     SENSIPAR 60 MG tablet Take 180 mg by mouth See admin instructions. Take one tablet by mouth on Tuesdays, Thursdays, Saturdays  3   VITAMIN D PO Take 1 tablet by mouth See admin instructions. Take one tablet by mouth on Tuesdays, Thursdays, Saturdays     warfarin (COUMADIN) 2 MG tablet Take 1 tablet (2 mg total) by mouth daily at 4 PM. Take 2mg  daily until FU with Dr.Kadakia and INR check on Monday 9/20 10 tablet 0   divalproex (DEPAKOTE ER) 250 MG 24 hr tablet Take 1 tablet (250 mg total) by mouth at bedtime. 90 tablet 1   lamoTRIgine (LAMICTAL) 25 MG tablet Take 1 tablet (25 mg total) by mouth 2 (two) times daily. 180 tablet 3   levETIRAcetam (KEPPRA) 500 MG tablet Take 1 tablet (500 mg total) by mouth 2 (two) times daily. 180 tablet 0   No facility-administered medications prior to visit.    PAST MEDICAL HISTORY: Past Medical History:  Diagnosis Date   Atrial fibrillation ([place])    Chronic renal insufficiency    2/2 ischemic ATN, GFR 15-17 ml/min,  baseline Cr: 4-4.5, never had a renal biopsy, s/p AV fistula placement in 2007, f/b nephrologist, Dr. Dorothe Pea, [place]   Congenital solitary kidney    Congestive heart failure ([place])    End stage renal disease on dialysis [facility])    fistula left arm, dialysis on Tues, Thurs, Sat.   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   Seizure disorder ([place])    complex- partial, h/o sz disorder as a child   Seizures ([place])    last sz 07/05/12 grand mal   Severe protein-calorie malnutrition ([place])    Thyroid disorder     PAST SURGICAL HISTORY: Past Surgical History:  Procedure Laterality Date   HERNIA REPAIR     KIDNEY SURGERY     KIDNEY TRANSPLANT  08/14/12   NEPHRECTOMY TRANSPLANTED ORGAN  10/14/12   RIGHT/LEFT HEART CATH AND CORONARY ANGIOGRAPHY N/A 12/08/2019   Procedure: RIGHT/LEFT HEART CATH AND CORONARY ANGIOGRAPHY;  Surgeon: Dixie Dials, MD;  Location: [place] [facility];  Service: Cardiovascular;  Laterality: N/A;   TONSILLECTOMY      FAMILY HISTORY: Family History  Problem Relation Age of Onset   Osteoarthritis Mother    Hypertension Father    Muscular dystrophy Brother    Heart disease Child        one 29 y/o son has MVP    SOCIAL HISTORY: Social History   Socioeconomic History   Marital status: Married    Spouse name: Lattie Haw   Number of children: 1   Years of education: HS   Highest education level: Not on file  Occupational History   Occupation:  Currently unemployed    Employer: DISABLED    Comment: used to [employer] as Scientist, clinical (histocompatibility and immunogenetics) in office in [place]  Use   Smoking status: Never   Smokeless tobacco: Never  Vaping Use   Vaping Use: Never used  Substance and Sexual Activity   Alcohol use: No    Comment: social drinker   Drug use: No   Sexual activity: Yes    Birth control/protection: None  Other Topics Concern   Not on file  Social History Narrative   Moved from [place] on 08/12/06.   Lives with parents in [place].   Has been married for 76yrs.                  Social Determinants of Health   Financial Resource Strain: Not on file  Food Insecurity: Not on file  Transportation Needs: Not on file  Physical Activity: Not on file  Stress: Not on file  Social Connections: Not on file  Intimate Partner Violence: Not on file      PHYSICAL EXAM  Vitals:   01/08/22 1255  BP: 114/78  Pulse: 84  Weight: 144 lb 8 oz (65.5 kg)  Height: 5\' 3"  (1.6 m)     Body mass index is 25.6 kg/m.  Generalized: Well developed, in no acute distress  Cardiology: irregularly, irregular rhythm  Respiratory: clear to auscultation bilaterally  Neurological examination  Mentation: Alert oriented to time, place, history taking. Follows all commands speech and language fluent Cranial nerve II-XII: Pupils were equal round reactive to light. Extraocular movements were full, visual field were full on confrontational test. Facial sensation and strength were normal. Head turning and shoulder shrug  were normal and symmetric. Motor: The motor testing reveals 5 over 5 strength of all 4 extremities. Good symmetric motor tone is noted throughout.  Sensory: Sensory testing is intact to soft touch on all 4 extremities. No evidence of extinction is noted.  Coordination: Cerebellar testing reveals good finger-nose-finger and heel-to-shin bilaterally.  Gait and station: Gait is normal.   DIAGNOSTIC DATA (LABS, IMAGING, TESTING) - I reviewed patient  records, labs, notes, testing and imaging myself where available.      No data to display           Lab Results  Component Value Date   WBC 9.5 12/11/2019   HGB 10.5 (L) 12/11/2019   HCT 31.8 (L) 12/11/2019   MCV 101.9 (H) 12/11/2019   PLT 267 12/11/2019      Component Value Date/Time   NA 134 (L) 12/11/2019 0755   NA 147 (H) 09/09/2008 1450   K 4.2 12/11/2019 0755   K 5.0 (H) 09/09/2008 1450   CL 96 (L) 12/11/2019 0755   CL 111 (H) 09/09/2008 1450   CO2 25 12/11/2019 0755   CO2 26 09/09/2008 1450   GLUCOSE 139 (H) 12/11/2019 0755   GLUCOSE 106 09/09/2008 1450   BUN 33 (H) 12/11/2019 0755   BUN 51 (H) 09/09/2008 1450   CREATININE 5.74 (H) 12/11/2019 0755   CREATININE 4.7 (H) 09/09/2008 1450   CALCIUM 9.6 12/11/2019 0755   CALCIUM 10.8 (H) 09/09/2008 1450   PROT 4.7 (L) 09/16/2012 1453   PROT 6.4 09/09/2008 1450   ALBUMIN 2.6 (L) 12/07/2019 1336   ALBUMIN 3.2 (L) 09/16/2012 1453   AST 27 09/16/2012 1453   AST 24 09/09/2008 1450   ALT 45 (H) 09/16/2012 1453   ALT 21 09/09/2008 1450   ALKPHOS 68 09/16/2012 1453   ALKPHOS 38 09/09/2008 1450   BILITOT 0.3 09/16/2012 1453   BILITOT 0.70 09/09/2008 1450   GFRNONAA 10 (L) 12/11/2019 0755   GFRAA  12 (L) 12/11/2019 0755   Lab Results  Component Value Date   CHOL 171 08/17/2008   HDL 41 08/17/2008   LDLCALC 107 (H) 08/17/2008   TRIG 115 08/17/2008   CHOLHDL 4.2 Ratio 08/17/2008   No results found for: "HGBA1C" No results found for: "VITAMINB12" Lab Results  Component Value Date   TSH 2.046 01/13/2012       ASSESSMENT AND PLAN 61 y.o. 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), and Thyroid disorder. here with     ICD-10-CM   1. Seizure disorder ([place])  G40.909 CBC with Differential/Platelets    CMP    Lamotrigine level    Valproic Acid Level       Shivam is doing well today. He continues levetiracetam 500mg  BID, lamortigine 25mg  BID and divalproex ER 250mg  at bedtime. He was advised to continue current regimen. We will update labs. He was encouraged to stay well hydrated, eat a well balanced diet and stay active. He will follow up with me in 1 year, sooner if needed. He verbalizes understanding and agreement with this plan.    Orders Placed This Encounter  Procedures   CBC with Differential/Platelets   CMP   Lamotrigine level   Valproic Acid Level      Meds ordered this encounter  Medications   lamoTRIgine (LAMICTAL) 25 MG tablet    Sig: Take 1 tablet (25 mg total) by mouth 2 (two) times daily.    Dispense:  180 tablet    Refill:  3    Order Specific Question:   Supervising Provider    Answer:   Melvenia Beam [[id number]]   levETIRAcetam (KEPPRA) 500 MG tablet    Sig: Take 1 tablet (500 mg total) by mouth 2 (two) times daily.    Dispense:  180 tablet    Refill:  3    Order Specific Question:   Supervising Provider    Answer:   Melvenia Beam [[id number]]   divalproex (DEPAKOTE ER) 250 MG 24 hr tablet    Sig: Take 1 tablet (250 mg total) by mouth at bedtime.    Dispense:  90 tablet    Refill:  3    Order Specific Question:   Supervising Provider    Answer:   Melvenia Beam [[id number]]       VEH MCNOB, FNP-C 01/08/2022, 1:23 PM [facility] [address] [place], [place] [address] [phone]

## 2022-01-04 DIAGNOSIS — Z992 Dependence on renal dialysis: Secondary | ICD-10-CM | POA: Diagnosis not present

## 2022-01-04 DIAGNOSIS — N186 End stage renal disease: Secondary | ICD-10-CM | POA: Diagnosis not present

## 2022-01-04 DIAGNOSIS — N2581 Secondary hyperparathyroidism of renal origin: Secondary | ICD-10-CM | POA: Diagnosis not present

## 2022-01-06 DIAGNOSIS — Z992 Dependence on renal dialysis: Secondary | ICD-10-CM | POA: Diagnosis not present

## 2022-01-06 DIAGNOSIS — N2581 Secondary hyperparathyroidism of renal origin: Secondary | ICD-10-CM | POA: Diagnosis not present

## 2022-01-06 DIAGNOSIS — N186 End stage renal disease: Secondary | ICD-10-CM | POA: Diagnosis not present

## 2022-01-08 ENCOUNTER — Ambulatory Visit (INDEPENDENT_AMBULATORY_CARE_PROVIDER_SITE_OTHER): Payer: Medicare HMO | Admitting: Family Medicine

## 2022-01-08 ENCOUNTER — Encounter: Payer: Self-pay | Admitting: Family Medicine

## 2022-01-08 VITALS — BP 114/78 | HR 84 | Ht 63.0 in | Wt 144.5 lb

## 2022-01-08 DIAGNOSIS — G40909 Epilepsy, unspecified, not intractable, without status epilepticus: Secondary | ICD-10-CM

## 2022-01-08 MED ORDER — DIVALPROEX SODIUM ER 250 MG PO TB24: 250.0000 mg | ORAL_TABLET | Freq: Every day | ORAL | 3 refills | Status: DC

## 2022-01-08 MED ORDER — LEVETIRACETAM 500 MG PO TABS: 500.0000 mg | ORAL_TABLET | Freq: Two times a day (BID) | ORAL | 3 refills | Status: DC

## 2022-01-08 MED ORDER — LAMOTRIGINE 25 MG PO TABS: 25.0000 mg | ORAL_TABLET | Freq: Two times a day (BID) | ORAL | 3 refills | Status: DC

## 2022-01-09 DIAGNOSIS — Z992 Dependence on renal dialysis: Secondary | ICD-10-CM | POA: Diagnosis not present

## 2022-01-09 DIAGNOSIS — N2581 Secondary hyperparathyroidism of renal origin: Secondary | ICD-10-CM | POA: Diagnosis not present

## 2022-01-09 DIAGNOSIS — N186 End stage renal disease: Secondary | ICD-10-CM | POA: Diagnosis not present

## 2022-01-09 LAB — COMPREHENSIVE METABOLIC PANEL
ALT: 11 IU/L (ref 0–44)
AST: 21 IU/L (ref 0–40)
Albumin/Globulin Ratio: 1.5 (ref 1.2–2.2)
Albumin: 4.3 g/dL (ref 3.8–4.9)
Alkaline Phosphatase: 140 IU/L — ABNORMAL HIGH (ref 44–121)
BUN/Creatinine Ratio: 4 — ABNORMAL LOW (ref 10–24)
BUN: 26 mg/dL (ref 8–27)
Bilirubin Total: 0.9 mg/dL (ref 0.0–1.2)
CO2: 32 mmol/L — ABNORMAL HIGH (ref 20–29)
Calcium: 11.4 mg/dL — ABNORMAL HIGH (ref 8.6–10.2)
Chloride: 91 mmol/L — ABNORMAL LOW (ref 96–106)
Creatinine, Ser: 6.19 mg/dL — ABNORMAL HIGH (ref 0.76–1.27)
Globulin, Total: 2.8 g/dL (ref 1.5–4.5)
Glucose: 107 mg/dL — ABNORMAL HIGH (ref 70–99)
Potassium: 3.8 mmol/L (ref 3.5–5.2)
Sodium: 140 mmol/L (ref 134–144)
Total Protein: 7.1 g/dL (ref 6.0–8.5)
eGFR: 10 mL/min/{1.73_m2} — ABNORMAL LOW (ref >59)

## 2022-01-09 LAB — CBC WITH DIFFERENTIAL/PLATELET
Basophils Absolute: 0 10*3/uL (ref 0.0–0.2)
Basos: 1 %
EOS (ABSOLUTE): 0.1 10*3/uL (ref 0.0–0.4)
Eos: 2 %
Hematocrit: 35 % — ABNORMAL LOW (ref 37.5–51.0)
Hemoglobin: 11.7 g/dL — ABNORMAL LOW (ref 13.0–17.7)
Immature Grans (Abs): 0 10*3/uL (ref 0.0–0.1)
Immature Granulocytes: 0 %
Lymphocytes Absolute: 1.2 10*3/uL (ref 0.7–3.1)
Lymphs: 21 %
MCH: 32.6 pg (ref 26.6–33.0)
MCHC: 33.4 g/dL (ref 31.5–35.7)
MCV: 98 fL — ABNORMAL HIGH (ref 79–97)
Monocytes Absolute: 0.4 10*3/uL (ref 0.1–0.9)
Monocytes: 7 %
Neutrophils Absolute: 4 10*3/uL (ref 1.4–7.0)
Neutrophils: 69 %
Platelets: 174 10*3/uL (ref 150–450)
RBC: 3.59 x10E6/uL — ABNORMAL LOW (ref 4.14–5.80)
RDW: 13.9 % (ref 11.6–15.4)
WBC: 5.7 10*3/uL (ref 3.4–10.8)

## 2022-01-09 LAB — LAMOTRIGINE LEVEL: Lamotrigine Lvl: 4.1 ug/mL (ref 2.0–20.0)

## 2022-01-09 LAB — VALPROIC ACID LEVEL: Valproic Acid Lvl: 16 ug/mL — ABNORMAL LOW (ref 50–100)

## 2022-01-11 DIAGNOSIS — N186 End stage renal disease: Secondary | ICD-10-CM | POA: Diagnosis not present

## 2022-01-11 DIAGNOSIS — Z992 Dependence on renal dialysis: Secondary | ICD-10-CM | POA: Diagnosis not present

## 2022-01-11 DIAGNOSIS — N2581 Secondary hyperparathyroidism of renal origin: Secondary | ICD-10-CM | POA: Diagnosis not present

## 2022-01-13 DIAGNOSIS — Z992 Dependence on renal dialysis: Secondary | ICD-10-CM | POA: Diagnosis not present

## 2022-01-13 DIAGNOSIS — N2581 Secondary hyperparathyroidism of renal origin: Secondary | ICD-10-CM | POA: Diagnosis not present

## 2022-01-13 DIAGNOSIS — N186 End stage renal disease: Secondary | ICD-10-CM | POA: Diagnosis not present

## 2022-01-16 DIAGNOSIS — N186 End stage renal disease: Secondary | ICD-10-CM | POA: Diagnosis not present

## 2022-01-16 DIAGNOSIS — N2581 Secondary hyperparathyroidism of renal origin: Secondary | ICD-10-CM | POA: Diagnosis not present

## 2022-01-16 DIAGNOSIS — Z992 Dependence on renal dialysis: Secondary | ICD-10-CM | POA: Diagnosis not present

## 2022-01-18 DIAGNOSIS — N2581 Secondary hyperparathyroidism of renal origin: Secondary | ICD-10-CM | POA: Diagnosis not present

## 2022-01-18 DIAGNOSIS — N186 End stage renal disease: Secondary | ICD-10-CM | POA: Diagnosis not present

## 2022-01-18 DIAGNOSIS — Z992 Dependence on renal dialysis: Secondary | ICD-10-CM | POA: Diagnosis not present

## 2022-01-20 DIAGNOSIS — N186 End stage renal disease: Secondary | ICD-10-CM | POA: Diagnosis not present

## 2022-01-20 DIAGNOSIS — Z992 Dependence on renal dialysis: Secondary | ICD-10-CM | POA: Diagnosis not present

## 2022-01-20 DIAGNOSIS — N2581 Secondary hyperparathyroidism of renal origin: Secondary | ICD-10-CM | POA: Diagnosis not present

## 2022-01-23 DIAGNOSIS — I120 Hypertensive chronic kidney disease with stage 5 chronic kidney disease or end stage renal disease: Secondary | ICD-10-CM | POA: Diagnosis not present

## 2022-01-23 DIAGNOSIS — N2581 Secondary hyperparathyroidism of renal origin: Secondary | ICD-10-CM | POA: Diagnosis not present

## 2022-01-23 DIAGNOSIS — Z992 Dependence on renal dialysis: Secondary | ICD-10-CM | POA: Diagnosis not present

## 2022-01-23 DIAGNOSIS — N186 End stage renal disease: Secondary | ICD-10-CM | POA: Diagnosis not present

## 2022-01-25 DIAGNOSIS — N2581 Secondary hyperparathyroidism of renal origin: Secondary | ICD-10-CM | POA: Diagnosis not present

## 2022-01-25 DIAGNOSIS — Z992 Dependence on renal dialysis: Secondary | ICD-10-CM | POA: Diagnosis not present

## 2022-01-25 DIAGNOSIS — N186 End stage renal disease: Secondary | ICD-10-CM | POA: Diagnosis not present

## 2022-01-27 DIAGNOSIS — N186 End stage renal disease: Secondary | ICD-10-CM | POA: Diagnosis not present

## 2022-01-27 DIAGNOSIS — Z992 Dependence on renal dialysis: Secondary | ICD-10-CM | POA: Diagnosis not present

## 2022-01-27 DIAGNOSIS — N2581 Secondary hyperparathyroidism of renal origin: Secondary | ICD-10-CM | POA: Diagnosis not present

## 2022-01-30 DIAGNOSIS — N2581 Secondary hyperparathyroidism of renal origin: Secondary | ICD-10-CM | POA: Diagnosis not present

## 2022-01-30 DIAGNOSIS — N186 End stage renal disease: Secondary | ICD-10-CM | POA: Diagnosis not present

## 2022-01-30 DIAGNOSIS — Z992 Dependence on renal dialysis: Secondary | ICD-10-CM | POA: Diagnosis not present

## 2022-02-01 DIAGNOSIS — N2581 Secondary hyperparathyroidism of renal origin: Secondary | ICD-10-CM | POA: Diagnosis not present

## 2022-02-01 DIAGNOSIS — Z992 Dependence on renal dialysis: Secondary | ICD-10-CM | POA: Diagnosis not present

## 2022-02-01 DIAGNOSIS — N186 End stage renal disease: Secondary | ICD-10-CM | POA: Diagnosis not present

## 2022-02-03 DIAGNOSIS — N2581 Secondary hyperparathyroidism of renal origin: Secondary | ICD-10-CM | POA: Diagnosis not present

## 2022-02-03 DIAGNOSIS — N186 End stage renal disease: Secondary | ICD-10-CM | POA: Diagnosis not present

## 2022-02-03 DIAGNOSIS — Z992 Dependence on renal dialysis: Secondary | ICD-10-CM | POA: Diagnosis not present

## 2022-02-06 DIAGNOSIS — N2581 Secondary hyperparathyroidism of renal origin: Secondary | ICD-10-CM | POA: Diagnosis not present

## 2022-02-06 DIAGNOSIS — Z992 Dependence on renal dialysis: Secondary | ICD-10-CM | POA: Diagnosis not present

## 2022-02-06 DIAGNOSIS — N186 End stage renal disease: Secondary | ICD-10-CM | POA: Diagnosis not present

## 2022-02-08 DIAGNOSIS — N186 End stage renal disease: Secondary | ICD-10-CM | POA: Diagnosis not present

## 2022-02-08 DIAGNOSIS — Z992 Dependence on renal dialysis: Secondary | ICD-10-CM | POA: Diagnosis not present

## 2022-02-08 DIAGNOSIS — N2581 Secondary hyperparathyroidism of renal origin: Secondary | ICD-10-CM | POA: Diagnosis not present

## 2022-02-10 DIAGNOSIS — Z992 Dependence on renal dialysis: Secondary | ICD-10-CM | POA: Diagnosis not present

## 2022-02-10 DIAGNOSIS — N186 End stage renal disease: Secondary | ICD-10-CM | POA: Diagnosis not present

## 2022-02-10 DIAGNOSIS — N2581 Secondary hyperparathyroidism of renal origin: Secondary | ICD-10-CM | POA: Diagnosis not present

## 2022-02-12 DIAGNOSIS — N2581 Secondary hyperparathyroidism of renal origin: Secondary | ICD-10-CM | POA: Diagnosis not present

## 2022-02-12 DIAGNOSIS — Z992 Dependence on renal dialysis: Secondary | ICD-10-CM | POA: Diagnosis not present

## 2022-02-12 DIAGNOSIS — N186 End stage renal disease: Secondary | ICD-10-CM | POA: Diagnosis not present

## 2022-02-14 DIAGNOSIS — N2581 Secondary hyperparathyroidism of renal origin: Secondary | ICD-10-CM | POA: Diagnosis not present

## 2022-02-14 DIAGNOSIS — Z992 Dependence on renal dialysis: Secondary | ICD-10-CM | POA: Diagnosis not present

## 2022-02-14 DIAGNOSIS — N186 End stage renal disease: Secondary | ICD-10-CM | POA: Diagnosis not present

## 2022-02-17 DIAGNOSIS — N2581 Secondary hyperparathyroidism of renal origin: Secondary | ICD-10-CM | POA: Diagnosis not present

## 2022-02-17 DIAGNOSIS — Z992 Dependence on renal dialysis: Secondary | ICD-10-CM | POA: Diagnosis not present

## 2022-02-17 DIAGNOSIS — N186 End stage renal disease: Secondary | ICD-10-CM | POA: Diagnosis not present

## 2022-02-20 DIAGNOSIS — Z992 Dependence on renal dialysis: Secondary | ICD-10-CM | POA: Diagnosis not present

## 2022-02-20 DIAGNOSIS — N2581 Secondary hyperparathyroidism of renal origin: Secondary | ICD-10-CM | POA: Diagnosis not present

## 2022-02-20 DIAGNOSIS — N186 End stage renal disease: Secondary | ICD-10-CM | POA: Diagnosis not present

## 2022-02-22 DIAGNOSIS — Z992 Dependence on renal dialysis: Secondary | ICD-10-CM | POA: Diagnosis not present

## 2022-02-22 DIAGNOSIS — N2581 Secondary hyperparathyroidism of renal origin: Secondary | ICD-10-CM | POA: Diagnosis not present

## 2022-02-22 DIAGNOSIS — N186 End stage renal disease: Secondary | ICD-10-CM | POA: Diagnosis not present

## 2022-02-22 DIAGNOSIS — I120 Hypertensive chronic kidney disease with stage 5 chronic kidney disease or end stage renal disease: Secondary | ICD-10-CM | POA: Diagnosis not present

## 2022-02-24 DIAGNOSIS — N2581 Secondary hyperparathyroidism of renal origin: Secondary | ICD-10-CM | POA: Diagnosis not present

## 2022-02-24 DIAGNOSIS — R0902 Hypoxemia: Secondary | ICD-10-CM | POA: Diagnosis not present

## 2022-02-24 DIAGNOSIS — I499 Cardiac arrhythmia, unspecified: Secondary | ICD-10-CM | POA: Diagnosis not present

## 2022-02-24 DIAGNOSIS — R000 Tachycardia, unspecified: Secondary | ICD-10-CM | POA: Diagnosis not present

## 2022-02-24 DIAGNOSIS — N186 End stage renal disease: Secondary | ICD-10-CM | POA: Diagnosis not present

## 2022-02-24 DIAGNOSIS — Z992 Dependence on renal dialysis: Secondary | ICD-10-CM | POA: Diagnosis not present

## 2022-02-24 DIAGNOSIS — I4901 Ventricular fibrillation: Secondary | ICD-10-CM | POA: Diagnosis not present

## 2022-02-26 ENCOUNTER — Telehealth: Payer: Self-pay | Admitting: Family Medicine

## 2022-02-26 NOTE — Telephone Encounter (Signed)
Pt passed away suddenly on 03/22/2022 Saturday. Would like to call to speak with Amy, NP.

## 2022-02-26 NOTE — Telephone Encounter (Signed)
Sympathy card sent 

## 2022-03-26 DEATH — deceased

## 2023-01-02 ENCOUNTER — Telehealth: Payer: Self-pay | Admitting: Family Medicine

## 2023-01-02 NOTE — Telephone Encounter (Signed)
Wife reports pt passed 10 months ago

## 2023-01-09 ENCOUNTER — Ambulatory Visit: Payer: Self-pay | Admitting: Family Medicine
